# Patient Record
Sex: Female | Born: 1955 | Hispanic: No | Marital: Single | Smoking: Never smoker
Health system: Southern US, Community
[De-identification: ages and names within clinical notes are randomized; demographics above are authoritative.]

## PROBLEM LIST (undated history)

## (undated) DIAGNOSIS — B029 Zoster without complications: Secondary | ICD-10-CM

## (undated) DIAGNOSIS — R569 Unspecified convulsions: Secondary | ICD-10-CM

## (undated) DIAGNOSIS — N189 Chronic kidney disease, unspecified: Secondary | ICD-10-CM

## (undated) DIAGNOSIS — J45909 Unspecified asthma, uncomplicated: Secondary | ICD-10-CM

## (undated) DIAGNOSIS — D696 Thrombocytopenia, unspecified: Secondary | ICD-10-CM

## (undated) DIAGNOSIS — I058 Other rheumatic mitral valve diseases: Secondary | ICD-10-CM

## (undated) DIAGNOSIS — Z86718 Personal history of other venous thrombosis and embolism: Secondary | ICD-10-CM

## (undated) DIAGNOSIS — J441 Chronic obstructive pulmonary disease with (acute) exacerbation: Secondary | ICD-10-CM

## (undated) DIAGNOSIS — I959 Hypotension, unspecified: Secondary | ICD-10-CM

## (undated) DIAGNOSIS — A419 Sepsis, unspecified organism: Secondary | ICD-10-CM

## (undated) DIAGNOSIS — F32A Depression, unspecified: Secondary | ICD-10-CM

## (undated) DIAGNOSIS — I82409 Acute embolism and thrombosis of unspecified deep veins of unspecified lower extremity: Secondary | ICD-10-CM

## (undated) DIAGNOSIS — F329 Major depressive disorder, single episode, unspecified: Secondary | ICD-10-CM

## (undated) DIAGNOSIS — Z8659 Personal history of other mental and behavioral disorders: Secondary | ICD-10-CM

## (undated) DIAGNOSIS — M1990 Unspecified osteoarthritis, unspecified site: Secondary | ICD-10-CM

## (undated) DIAGNOSIS — F209 Schizophrenia, unspecified: Secondary | ICD-10-CM

## (undated) DIAGNOSIS — I10 Essential (primary) hypertension: Secondary | ICD-10-CM

## (undated) DIAGNOSIS — I358 Other nonrheumatic aortic valve disorders: Secondary | ICD-10-CM

## (undated) DIAGNOSIS — E039 Hypothyroidism, unspecified: Secondary | ICD-10-CM

## (undated) DIAGNOSIS — K219 Gastro-esophageal reflux disease without esophagitis: Secondary | ICD-10-CM

## (undated) HISTORY — DX: Zoster without complications: B02.9

## (undated) HISTORY — PX: SPLENECTOMY: SUR1306

## (undated) HISTORY — PX: REPLACEMENT TOTAL KNEE: SUR1224

## (undated) HISTORY — PX: ABDOMINAL HYSTERECTOMY: SHX81

## (undated) HISTORY — DX: Personal history of other venous thrombosis and embolism: Z86.718

## (undated) HISTORY — DX: Essential (primary) hypertension: I10

## (undated) HISTORY — DX: Personal history of other mental and behavioral disorders: Z86.59

## (undated) HISTORY — PX: THYROIDECTOMY, PARTIAL: SHX18

## (undated) HISTORY — DX: Chronic obstructive pulmonary disease with (acute) exacerbation: J44.1

## (undated) HISTORY — DX: Thrombocytopenia, unspecified: D69.6

---
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
## 2000-02-07 ENCOUNTER — Inpatient Hospital Stay (HOSPITAL_COMMUNITY): Admission: EM | Admit: 2000-02-07 | Discharge: 2000-02-16 | Payer: Self-pay | Admitting: *Deleted

## 2004-07-26 ENCOUNTER — Emergency Department
Admission: EM | Admit: 2004-07-26 | Disposition: A | Discharge status: Home / Self Care | Payer: Self-pay | Source: Ambulatory Visit

## 2012-02-24 DIAGNOSIS — H61329 Acquired stenosis of external ear canal secondary to inflammation and infection, unspecified ear: Secondary | ICD-10-CM | POA: Insufficient documentation

## 2012-02-24 DIAGNOSIS — H9190 Unspecified hearing loss, unspecified ear: Secondary | ICD-10-CM

## 2012-02-24 HISTORY — DX: Acquired stenosis of external ear canal secondary to inflammation and infection, unspecified ear: H61.329

## 2012-02-24 HISTORY — DX: Unspecified hearing loss, unspecified ear: H91.90

## 2012-03-17 HISTORY — DX: Morbid (severe) obesity due to excess calories: E66.01

## 2012-06-19 DIAGNOSIS — H61309 Acquired stenosis of external ear canal, unspecified, unspecified ear: Secondary | ICD-10-CM | POA: Insufficient documentation

## 2012-06-19 HISTORY — DX: Acquired stenosis of external ear canal, unspecified, unspecified ear: H61.309

## 2013-02-28 DIAGNOSIS — N951 Menopausal and female climacteric states: Secondary | ICD-10-CM | POA: Insufficient documentation

## 2013-02-28 DIAGNOSIS — L299 Pruritus, unspecified: Secondary | ICD-10-CM | POA: Insufficient documentation

## 2013-07-25 DIAGNOSIS — R599 Enlarged lymph nodes, unspecified: Secondary | ICD-10-CM | POA: Insufficient documentation

## 2013-12-03 DIAGNOSIS — R159 Full incontinence of feces: Secondary | ICD-10-CM | POA: Insufficient documentation

## 2013-12-03 DIAGNOSIS — R001 Bradycardia, unspecified: Secondary | ICD-10-CM | POA: Insufficient documentation

## 2013-12-03 DIAGNOSIS — I509 Heart failure, unspecified: Secondary | ICD-10-CM | POA: Insufficient documentation

## 2014-03-18 DIAGNOSIS — R10811 Right upper quadrant abdominal tenderness: Secondary | ICD-10-CM | POA: Insufficient documentation

## 2014-05-15 DIAGNOSIS — M79671 Pain in right foot: Secondary | ICD-10-CM | POA: Insufficient documentation

## 2014-06-03 DIAGNOSIS — R079 Chest pain, unspecified: Secondary | ICD-10-CM | POA: Insufficient documentation

## 2014-06-03 DIAGNOSIS — D869 Sarcoidosis, unspecified: Secondary | ICD-10-CM | POA: Insufficient documentation

## 2014-06-03 DIAGNOSIS — J45909 Unspecified asthma, uncomplicated: Secondary | ICD-10-CM

## 2014-06-03 DIAGNOSIS — E785 Hyperlipidemia, unspecified: Secondary | ICD-10-CM | POA: Insufficient documentation

## 2014-06-03 DIAGNOSIS — F419 Anxiety disorder, unspecified: Secondary | ICD-10-CM | POA: Insufficient documentation

## 2014-06-03 HISTORY — DX: Chest pain, unspecified: R07.9

## 2014-06-03 HISTORY — DX: Anxiety disorder, unspecified: F41.9

## 2014-06-03 HISTORY — DX: Hyperlipidemia, unspecified: E78.5

## 2014-07-03 DIAGNOSIS — R251 Tremor, unspecified: Secondary | ICD-10-CM | POA: Insufficient documentation

## 2014-07-24 DIAGNOSIS — S2092XD Blister (nonthermal) of unspecified parts of thorax, subsequent encounter: Secondary | ICD-10-CM | POA: Insufficient documentation

## 2014-09-05 DIAGNOSIS — R3981 Functional urinary incontinence: Secondary | ICD-10-CM | POA: Insufficient documentation

## 2015-05-29 DIAGNOSIS — K922 Gastrointestinal hemorrhage, unspecified: Secondary | ICD-10-CM | POA: Insufficient documentation

## 2015-07-02 DIAGNOSIS — F0280 Dementia in other diseases classified elsewhere without behavioral disturbance: Secondary | ICD-10-CM | POA: Insufficient documentation

## 2016-01-09 DIAGNOSIS — F209 Schizophrenia, unspecified: Secondary | ICD-10-CM

## 2016-01-09 DIAGNOSIS — M1990 Unspecified osteoarthritis, unspecified site: Secondary | ICD-10-CM

## 2016-01-09 DIAGNOSIS — E669 Obesity, unspecified: Secondary | ICD-10-CM

## 2016-01-09 DIAGNOSIS — F418 Other specified anxiety disorders: Secondary | ICD-10-CM

## 2016-01-09 DIAGNOSIS — R4182 Altered mental status, unspecified: Secondary | ICD-10-CM | POA: Insufficient documentation

## 2016-01-09 DIAGNOSIS — E785 Hyperlipidemia, unspecified: Secondary | ICD-10-CM

## 2016-01-09 DIAGNOSIS — I10 Essential (primary) hypertension: Secondary | ICD-10-CM | POA: Diagnosis not present

## 2016-01-09 DIAGNOSIS — N183 Chronic kidney disease, stage 3 (moderate): Secondary | ICD-10-CM | POA: Diagnosis not present

## 2016-01-09 DIAGNOSIS — J45909 Unspecified asthma, uncomplicated: Secondary | ICD-10-CM

## 2016-01-09 DIAGNOSIS — J189 Pneumonia, unspecified organism: Secondary | ICD-10-CM

## 2016-01-10 DIAGNOSIS — I10 Essential (primary) hypertension: Secondary | ICD-10-CM | POA: Diagnosis not present

## 2016-01-10 DIAGNOSIS — E669 Obesity, unspecified: Secondary | ICD-10-CM | POA: Diagnosis not present

## 2016-01-10 DIAGNOSIS — J189 Pneumonia, unspecified organism: Secondary | ICD-10-CM | POA: Diagnosis not present

## 2016-01-10 DIAGNOSIS — N183 Chronic kidney disease, stage 3 (moderate): Secondary | ICD-10-CM | POA: Diagnosis not present

## 2016-01-11 DIAGNOSIS — E669 Obesity, unspecified: Secondary | ICD-10-CM | POA: Diagnosis not present

## 2016-01-11 DIAGNOSIS — I10 Essential (primary) hypertension: Secondary | ICD-10-CM | POA: Diagnosis not present

## 2016-01-11 DIAGNOSIS — J189 Pneumonia, unspecified organism: Secondary | ICD-10-CM | POA: Diagnosis not present

## 2016-01-11 DIAGNOSIS — N183 Chronic kidney disease, stage 3 (moderate): Secondary | ICD-10-CM | POA: Diagnosis not present

## 2016-04-09 DIAGNOSIS — F2081 Schizophreniform disorder: Secondary | ICD-10-CM | POA: Insufficient documentation

## 2016-04-09 DIAGNOSIS — J4521 Mild intermittent asthma with (acute) exacerbation: Secondary | ICD-10-CM | POA: Insufficient documentation

## 2016-05-25 DIAGNOSIS — R296 Repeated falls: Secondary | ICD-10-CM | POA: Insufficient documentation

## 2016-10-18 ENCOUNTER — Encounter (HOSPITAL_COMMUNITY): Payer: Self-pay | Admitting: Emergency Medicine

## 2016-10-18 ENCOUNTER — Emergency Department (HOSPITAL_COMMUNITY)
Admission: EM | Admit: 2016-10-18 | Discharge: 2016-10-19 | Disposition: A | Discharge status: Home / Self Care | Payer: Medicaid Other | Attending: Emergency Medicine | Admitting: Emergency Medicine

## 2016-10-18 DIAGNOSIS — T148XXD Other injury of unspecified body region, subsequent encounter: Secondary | ICD-10-CM | POA: Diagnosis not present

## 2016-10-18 DIAGNOSIS — Y849 Medical procedure, unspecified as the cause of abnormal reaction of the patient, or of later complication, without mention of misadventure at the time of the procedure: Secondary | ICD-10-CM | POA: Insufficient documentation

## 2016-10-18 DIAGNOSIS — I82492 Acute embolism and thrombosis of other specified deep vein of left lower extremity: Secondary | ICD-10-CM

## 2016-10-18 DIAGNOSIS — T148XXA Other injury of unspecified body region, initial encounter: Secondary | ICD-10-CM

## 2016-10-18 DIAGNOSIS — Z79899 Other long term (current) drug therapy: Secondary | ICD-10-CM | POA: Insufficient documentation

## 2016-10-18 DIAGNOSIS — R2242 Localized swelling, mass and lump, left lower limb: Secondary | ICD-10-CM | POA: Diagnosis present

## 2016-10-18 DIAGNOSIS — R600 Localized edema: Secondary | ICD-10-CM | POA: Diagnosis not present

## 2016-10-18 LAB — COMPREHENSIVE METABOLIC PANEL
ALBUMIN: 3 g/dL — AB (ref 3.5–5.0)
ALT: 11 U/L — ABNORMAL LOW (ref 14–54)
ANION GAP: 6 (ref 5–15)
AST: 21 U/L (ref 15–41)
Alkaline Phosphatase: 73 U/L (ref 38–126)
BUN: 27 mg/dL — ABNORMAL HIGH (ref 6–20)
CHLORIDE: 108 mmol/L (ref 101–111)
CO2: 24 mmol/L (ref 22–32)
Calcium: 8.8 mg/dL — ABNORMAL LOW (ref 8.9–10.3)
Creatinine, Ser: 2.07 mg/dL — ABNORMAL HIGH (ref 0.44–1.00)
GFR calc Af Amer: 29 mL/min — ABNORMAL LOW (ref >60)
GFR calc non Af Amer: 25 mL/min — ABNORMAL LOW (ref >60)
GLUCOSE: 112 mg/dL — AB (ref 65–99)
POTASSIUM: 4.5 mmol/L (ref 3.5–5.1)
SODIUM: 138 mmol/L (ref 135–145)
Total Bilirubin: 0.5 mg/dL (ref 0.3–1.2)
Total Protein: 6.3 g/dL — ABNORMAL LOW (ref 6.5–8.1)

## 2016-10-18 LAB — CBC WITH DIFFERENTIAL/PLATELET
Basophils Absolute: 0 10*3/uL (ref 0.0–0.1)
Basophils Relative: 0 %
Eosinophils Absolute: 0.3 10*3/uL (ref 0.0–0.7)
Eosinophils Relative: 5 %
HEMATOCRIT: 37.3 % (ref 36.0–46.0)
HEMOGLOBIN: 11.8 g/dL — AB (ref 12.0–15.0)
LYMPHS ABS: 1.6 10*3/uL (ref 0.7–4.0)
LYMPHS PCT: 31 %
MCH: 28 pg (ref 26.0–34.0)
MCHC: 31.6 g/dL (ref 30.0–36.0)
MCV: 88.6 fL (ref 78.0–100.0)
MONO ABS: 0.5 10*3/uL (ref 0.1–1.0)
MONOS PCT: 10 %
NEUTROS ABS: 2.8 10*3/uL (ref 1.7–7.7)
Neutrophils Relative %: 54 %
Platelets: 102 10*3/uL — ABNORMAL LOW (ref 150–400)
RBC: 4.21 MIL/uL (ref 3.87–5.11)
RDW: 15.1 % (ref 11.5–15.5)
WBC: 5.1 10*3/uL (ref 4.0–10.5)

## 2016-10-18 LAB — I-STAT CG4 LACTIC ACID, ED: LACTIC ACID, VENOUS: 1.14 mmol/L (ref 0.5–1.9)

## 2016-10-18 NOTE — ED Triage Notes (Signed)
Pt to ER for evaluation of left lower leg swelling, states began one day last week. Left leg is significantly larger than right, is weeping. A/o x4.

## 2016-10-18 NOTE — ED Provider Notes (Signed)
[facility] DEPT Provider Note   CSN: [id number] Arrival date & time: 10/18/16  1529   History   Chief Complaint Chief Complaint  Patient presents with  . Leg Swelling   HPI Kelsey Leonard is a 61 y.o. female.  The patient is a 61 year old female with a past medical history significant for recent DVT (not currently on anticoagulation), seizures, schizophrenia, asthma, hypertension, hyperlipidemia, who presents to the ED in the company of her sister with pain, swelling, and weeping of her left lower extremity.  Her symptoms began 2 days ago chronic shortness of breath, however this has not worsened over the worsening swelling, pain, and clear, thin discharge from the wound to her left anterior shin.  She has not tried anything for the pain or swelling at this time.  The patient takes care of her own medications, which she gets through the mail from her pharmacy.  Her sister, who provides most of this history, is unsure whether or not she ever completed a full course of anticoagulation for her prior DVT.  The patient denies chest pain.  She reports chronic shortness of breath, however she denies any worsening of this over the past several days.   The history is provided by the patient, a relative and medical records. No language interpreter was used.   History reviewed. No pertinent past medical history.  There are no active problems to display for this patient.  History reviewed. No pertinent surgical history.  OB History    No data available     Home Medications    Prior to Admission medications   Medication Sig Start Date End Date Taking? Authorizing Provider  VESICARE 5 MG tablet Take 5 mg by mouth daily. 10/10/16  Yes [provider]  VITAMIN E PO Take 1 capsule by mouth daily.   Yes [provider]   Family History History reviewed. No pertinent family history.  Social History Social History  Substance Use Topics  . Smoking status: Never Smoker  .  Smokeless tobacco: Never Used  . Alcohol use Not on file   Allergies   Aspirin; Penicillins; and Sulfa antibiotics  Review of Systems Review of Systems  Constitutional: Negative for chills and fever.  HENT: Negative for ear pain and sore throat.   Eyes: Negative for pain and visual disturbance.  Respiratory: Positive for shortness of breath (chronic; no acute changes). Negative for cough.   Cardiovascular: Positive for leg swelling. Negative for chest pain and palpitations.  Gastrointestinal: Negative for abdominal pain and vomiting.  Genitourinary: Negative for dysuria and hematuria.       Urinary incontinence; no changes from baseline  Musculoskeletal: Negative for arthralgias and back pain.  Skin: Positive for wound. Negative for color change and rash.  Allergic/Immunologic: Negative for immunocompromised state.  Neurological: Negative for seizures and syncope.  All other systems reviewed and are negative.  Physical Exam Updated Vital Signs BP (!) 164/85 (BP Location: Left Arm)   Pulse (!) 57   Temp 97.9 F (36.6 C) (Oral)   Resp 16   SpO2 100%   Physical Exam  Constitutional: She is oriented to person, place, and time. She appears well-developed and well-nourished. No distress.  Obese woman in no acute distress  HENT:  Head: Normocephalic and atraumatic.  Eyes: Conjunctivae are normal.  Neck: Normal range of motion. Neck supple. No JVD present.  Cardiovascular: Regular rhythm, normal heart sounds and intact distal pulses.   No murmur heard. Pulmonary/Chest: Effort normal and breath sounds normal. No  respiratory distress.  Abdominal: Soft. There is no tenderness. There is no guarding.  Musculoskeletal: She exhibits edema and tenderness.  LLE with marked swelling and 2+ pitting edema; ~0.5cm wound to left anterior shin draining clear, serous fluid; normal sensation and gross motor function  Neurological: She is alert and oriented to person, place, and time.  Skin: Skin  is warm and dry.  Psychiatric: She has a normal mood and affect. Her behavior is normal.  Nursing note and vitals reviewed.   ED Treatments / Results  Labs (all labs ordered are listed, but only abnormal results are displayed) Labs Reviewed  COMPREHENSIVE METABOLIC PANEL - Abnormal; Notable for the following:       Result Value   Glucose, Bld 112 (*)    BUN 27 (*)    Creatinine, Ser 2.07 (*)    Calcium 8.8 (*)    Total Protein 6.3 (*)    Albumin 3.0 (*)    ALT 11 (*)    GFR calc non Af Amer 25 (*)    GFR calc Af Amer 29 (*)    All other components within normal limits  CBC WITH DIFFERENTIAL/PLATELET - Abnormal; Notable for the following:    Hemoglobin 11.8 (*)    Platelets 102 (*)    All other components within normal limits  I-STAT CG4 LACTIC ACID, ED  I-STAT CG4 LACTIC ACID, ED   EKG  EKG Interpretation None      Radiology No results found.  Procedures Procedures (including critical care time)  Medications Ordered in ED Medications - No data to display   Initial Impression / Assessment and Plan / ED Course  I have reviewed the triage vital signs and the nursing notes.  Pertinent labs & imaging results that were available during my care of the patient were reviewed by me and considered in my medical decision making (see chart for details).    Initial differential diagnosis included DVT, cellulitis, lymphedema, abscess, and trauma.  Pertinent labs included a CBC with mild normocytic anemia and thrombocytopenia; no leukocytosis.  No vascular studies available at this time.  The patient was given Lovenox for a presumed DVT and dermabond was applied to the patient's wound by the attending physician.  Upon reassessment, she had no new symptoms or complaints and was able to stand without assistance.  While the patient remained in the ED, her mother was contacted and reported that the patient had run out of Eliquis two weeks ago.  Based on the above findings, I suspect  the patient's pain and swelling is likely secondary to a persistent DVT.  She will be treated empirically for a DVT at this time, however she will require imaging to determine if her known DVT has grown.  I have a low suspicion for PE at this time given the absence of chest pain, hypoxia, and dyspnea.  No skin changes consistent with cellulitis.  No evidence of fluctuance or mass, decreasing my suspicion for an abscess.  History not consistent with traumatic injury.  I discussed the above results with the patient who verbalized understanding.  Return precautions and follow-up plans discussed including return to [facility] for a Doppler ultrasound tomorrow.  She was given a prescription for Eliquis with instructions for use outlined verbally and in writing.  She was informed that failure to comply with medical therapy may result in significant morbidity and/or mortality, which the patient and her sister understood.  The patient was discharged in stable condition.  Final Clinical Impressions(s) /  ED Diagnoses   Final diagnoses:  Acute deep vein thrombosis (DVT) of other specified vein of left lower extremity ([place])  Leg edema  Wound discharge   New Prescriptions New Prescriptions   No medications on file     Charisse March, MD 10/19/16 1188    Gareth Morgan, MD 10/25/16 2238

## 2016-10-18 NOTE — ED Notes (Signed)
MD at bedside. 

## 2016-10-19 ENCOUNTER — Ambulatory Visit (HOSPITAL_COMMUNITY)
Admission: RE | Admit: 2016-10-19 | Discharge: 2016-10-19 | Disposition: A | Discharge status: Home / Self Care | Payer: Medicaid Other | Source: Ambulatory Visit | Attending: Emergency Medicine | Admitting: Emergency Medicine

## 2016-10-19 DIAGNOSIS — M7989 Other specified soft tissue disorders: Secondary | ICD-10-CM | POA: Insufficient documentation

## 2016-10-19 DIAGNOSIS — M79609 Pain in unspecified limb: Secondary | ICD-10-CM

## 2016-10-19 MED ORDER — ENOXAPARIN SODIUM 30 MG/0.3ML ~~LOC~~ SOLN: 30.0000 mg | Freq: Once | SUBCUTANEOUS | Status: DC

## 2016-10-19 MED ORDER — APIXABAN 5 MG PO TABS: 5.0000 mg | ORAL_TABLET | Freq: Two times a day (BID) | ORAL | 0 refills | Status: DC

## 2016-10-19 MED ORDER — ENOXAPARIN SODIUM 120 MG/0.8ML ~~LOC~~ SOLN
110.0000 mg | Freq: Once | SUBCUTANEOUS | Status: AC
  Administered 2016-10-19: 110 mg via SUBCUTANEOUS
  Filled 2016-10-19: qty 0.8

## 2016-10-19 MED ORDER — APIXABAN 5 MG PO TABS: 10.0000 mg | ORAL_TABLET | Freq: Two times a day (BID) | ORAL | 0 refills | Status: DC

## 2016-10-19 NOTE — Progress Notes (Signed)
VASCULAR LAB PRELIMINARY  PRELIMINARY  PRELIMINARY  PRELIMINARY  Left lower extremity venous duplex completed.    Preliminary report:  Left - Positive for acute DVT in the popliteal vein. No obvious evidence of superficial thrombosis or baker's cyst.  Taggart Prasad, RVS 10/19/2016, 9:28 AM

## 2016-10-19 NOTE — ED Notes (Signed)
Pt stable, ambulatory, states understanding of discharge instructions 

## 2016-10-19 NOTE — Discharge Instructions (Addendum)
Take two tablets (10mg ) twice daily for 1 week.  Then take one tablet (5mg ) twice daily.  Please return to the hospital tomorrow for a formal ultrasound of your left lower extremity.  Follow-up with your primary care doctor within 1 week.

## 2017-01-17 DIAGNOSIS — R0600 Dyspnea, unspecified: Secondary | ICD-10-CM | POA: Insufficient documentation

## 2017-01-17 DIAGNOSIS — R42 Dizziness and giddiness: Secondary | ICD-10-CM | POA: Insufficient documentation

## 2018-02-23 DIAGNOSIS — L040 Acute lymphadenitis of face, head and neck: Secondary | ICD-10-CM | POA: Insufficient documentation

## 2018-03-30 DIAGNOSIS — R319 Hematuria, unspecified: Secondary | ICD-10-CM | POA: Insufficient documentation

## 2018-08-07 DIAGNOSIS — R609 Edema, unspecified: Secondary | ICD-10-CM | POA: Insufficient documentation

## 2018-08-07 DIAGNOSIS — R7981 Abnormal blood-gas level: Secondary | ICD-10-CM | POA: Insufficient documentation

## 2018-08-07 DIAGNOSIS — I361 Nonrheumatic tricuspid (valve) insufficiency: Secondary | ICD-10-CM | POA: Diagnosis not present

## 2018-08-08 DIAGNOSIS — I361 Nonrheumatic tricuspid (valve) insufficiency: Secondary | ICD-10-CM | POA: Diagnosis not present

## 2018-08-08 DIAGNOSIS — I161 Hypertensive emergency: Secondary | ICD-10-CM | POA: Diagnosis not present

## 2018-08-08 DIAGNOSIS — J9601 Acute respiratory failure with hypoxia: Secondary | ICD-10-CM | POA: Diagnosis not present

## 2018-08-08 DIAGNOSIS — N289 Disorder of kidney and ureter, unspecified: Secondary | ICD-10-CM | POA: Diagnosis not present

## 2018-08-08 DIAGNOSIS — N184 Chronic kidney disease, stage 4 (severe): Secondary | ICD-10-CM

## 2018-08-09 DIAGNOSIS — I161 Hypertensive emergency: Secondary | ICD-10-CM | POA: Diagnosis not present

## 2018-08-09 DIAGNOSIS — J9601 Acute respiratory failure with hypoxia: Secondary | ICD-10-CM | POA: Diagnosis not present

## 2018-08-09 DIAGNOSIS — N289 Disorder of kidney and ureter, unspecified: Secondary | ICD-10-CM | POA: Diagnosis not present

## 2018-08-10 DIAGNOSIS — I161 Hypertensive emergency: Secondary | ICD-10-CM | POA: Diagnosis not present

## 2018-08-10 DIAGNOSIS — J9601 Acute respiratory failure with hypoxia: Secondary | ICD-10-CM | POA: Diagnosis not present

## 2018-08-10 DIAGNOSIS — N289 Disorder of kidney and ureter, unspecified: Secondary | ICD-10-CM | POA: Diagnosis not present

## 2018-08-11 DIAGNOSIS — I161 Hypertensive emergency: Secondary | ICD-10-CM | POA: Diagnosis not present

## 2018-08-11 DIAGNOSIS — N289 Disorder of kidney and ureter, unspecified: Secondary | ICD-10-CM | POA: Diagnosis not present

## 2018-08-11 DIAGNOSIS — J9601 Acute respiratory failure with hypoxia: Secondary | ICD-10-CM | POA: Diagnosis not present

## 2018-08-12 ENCOUNTER — Inpatient Hospital Stay (HOSPITAL_COMMUNITY): Payer: Medicaid Other

## 2018-08-12 ENCOUNTER — Inpatient Hospital Stay (HOSPITAL_COMMUNITY)
Admission: AD | Admit: 2018-08-12 | Discharge: 2018-08-24 | DRG: 871 | Disposition: A | Discharge status: Home / Self Care | Payer: Medicaid Other | Source: Other Acute Inpatient Hospital | Attending: Internal Medicine | Admitting: Internal Medicine

## 2018-08-12 ENCOUNTER — Encounter (HOSPITAL_COMMUNITY): Payer: Self-pay | Admitting: Pulmonary Disease

## 2018-08-12 DIAGNOSIS — Z6841 Body Mass Index (BMI) 40.0 and over, adult: Secondary | ICD-10-CM | POA: Diagnosis not present

## 2018-08-12 DIAGNOSIS — E039 Hypothyroidism, unspecified: Secondary | ICD-10-CM | POA: Diagnosis present

## 2018-08-12 DIAGNOSIS — Z86718 Personal history of other venous thrombosis and embolism: Secondary | ICD-10-CM | POA: Diagnosis not present

## 2018-08-12 DIAGNOSIS — D6959 Other secondary thrombocytopenia: Secondary | ICD-10-CM | POA: Diagnosis present

## 2018-08-12 DIAGNOSIS — E873 Alkalosis: Secondary | ICD-10-CM | POA: Diagnosis not present

## 2018-08-12 DIAGNOSIS — Z4659 Encounter for fitting and adjustment of other gastrointestinal appliance and device: Secondary | ICD-10-CM

## 2018-08-12 DIAGNOSIS — Z95828 Presence of other vascular implants and grafts: Secondary | ICD-10-CM | POA: Diagnosis not present

## 2018-08-12 DIAGNOSIS — I440 Atrioventricular block, first degree: Secondary | ICD-10-CM | POA: Diagnosis present

## 2018-08-12 DIAGNOSIS — A4102 Sepsis due to Methicillin resistant Staphylococcus aureus: Principal | ICD-10-CM | POA: Diagnosis present

## 2018-08-12 DIAGNOSIS — D869 Sarcoidosis, unspecified: Secondary | ICD-10-CM | POA: Diagnosis present

## 2018-08-12 DIAGNOSIS — F209 Schizophrenia, unspecified: Secondary | ICD-10-CM | POA: Diagnosis present

## 2018-08-12 DIAGNOSIS — I509 Heart failure, unspecified: Secondary | ICD-10-CM | POA: Diagnosis present

## 2018-08-12 DIAGNOSIS — Z880 Allergy status to penicillin: Secondary | ICD-10-CM

## 2018-08-12 DIAGNOSIS — G253 Myoclonus: Secondary | ICD-10-CM | POA: Diagnosis present

## 2018-08-12 DIAGNOSIS — I130 Hypertensive heart and chronic kidney disease with heart failure and stage 1 through stage 4 chronic kidney disease, or unspecified chronic kidney disease: Secondary | ICD-10-CM | POA: Diagnosis present

## 2018-08-12 DIAGNOSIS — G9341 Metabolic encephalopathy: Secondary | ICD-10-CM

## 2018-08-12 DIAGNOSIS — N184 Chronic kidney disease, stage 4 (severe): Secondary | ICD-10-CM | POA: Diagnosis present

## 2018-08-12 DIAGNOSIS — Z833 Family history of diabetes mellitus: Secondary | ICD-10-CM | POA: Diagnosis not present

## 2018-08-12 DIAGNOSIS — J9690 Respiratory failure, unspecified, unspecified whether with hypoxia or hypercapnia: Secondary | ICD-10-CM

## 2018-08-12 DIAGNOSIS — J9621 Acute and chronic respiratory failure with hypoxia: Secondary | ICD-10-CM

## 2018-08-12 DIAGNOSIS — R4182 Altered mental status, unspecified: Secondary | ICD-10-CM

## 2018-08-12 DIAGNOSIS — I161 Hypertensive emergency: Secondary | ICD-10-CM | POA: Diagnosis not present

## 2018-08-12 DIAGNOSIS — M1990 Unspecified osteoarthritis, unspecified site: Secondary | ICD-10-CM | POA: Diagnosis present

## 2018-08-12 DIAGNOSIS — N189 Chronic kidney disease, unspecified: Secondary | ICD-10-CM | POA: Diagnosis not present

## 2018-08-12 DIAGNOSIS — J9600 Acute respiratory failure, unspecified whether with hypoxia or hypercapnia: Secondary | ICD-10-CM | POA: Diagnosis not present

## 2018-08-12 DIAGNOSIS — J9622 Acute and chronic respiratory failure with hypercapnia: Secondary | ICD-10-CM | POA: Diagnosis present

## 2018-08-12 DIAGNOSIS — Z91013 Allergy to seafood: Secondary | ICD-10-CM

## 2018-08-12 DIAGNOSIS — J9601 Acute respiratory failure with hypoxia: Secondary | ICD-10-CM | POA: Diagnosis not present

## 2018-08-12 DIAGNOSIS — R7881 Bacteremia: Secondary | ICD-10-CM

## 2018-08-12 DIAGNOSIS — I340 Nonrheumatic mitral (valve) insufficiency: Secondary | ICD-10-CM | POA: Diagnosis not present

## 2018-08-12 DIAGNOSIS — E669 Obesity, unspecified: Secondary | ICD-10-CM | POA: Diagnosis present

## 2018-08-12 DIAGNOSIS — F329 Major depressive disorder, single episode, unspecified: Secondary | ICD-10-CM | POA: Diagnosis present

## 2018-08-12 DIAGNOSIS — J441 Chronic obstructive pulmonary disease with (acute) exacerbation: Secondary | ICD-10-CM | POA: Diagnosis present

## 2018-08-12 DIAGNOSIS — Z886 Allergy status to analgesic agent status: Secondary | ICD-10-CM

## 2018-08-12 DIAGNOSIS — Z8249 Family history of ischemic heart disease and other diseases of the circulatory system: Secondary | ICD-10-CM

## 2018-08-12 DIAGNOSIS — Z881 Allergy status to other antibiotic agents status: Secondary | ICD-10-CM | POA: Diagnosis not present

## 2018-08-12 DIAGNOSIS — R6521 Severe sepsis with septic shock: Secondary | ICD-10-CM | POA: Diagnosis present

## 2018-08-12 DIAGNOSIS — N170 Acute kidney failure with tubular necrosis: Secondary | ICD-10-CM | POA: Diagnosis present

## 2018-08-12 DIAGNOSIS — J45909 Unspecified asthma, uncomplicated: Secondary | ICD-10-CM

## 2018-08-12 DIAGNOSIS — Z882 Allergy status to sulfonamides status: Secondary | ICD-10-CM | POA: Diagnosis not present

## 2018-08-12 DIAGNOSIS — B9562 Methicillin resistant Staphylococcus aureus infection as the cause of diseases classified elsewhere: Secondary | ICD-10-CM | POA: Diagnosis not present

## 2018-08-12 DIAGNOSIS — M549 Dorsalgia, unspecified: Secondary | ICD-10-CM | POA: Diagnosis not present

## 2018-08-12 DIAGNOSIS — J45901 Unspecified asthma with (acute) exacerbation: Secondary | ICD-10-CM | POA: Diagnosis not present

## 2018-08-12 DIAGNOSIS — N179 Acute kidney failure, unspecified: Secondary | ICD-10-CM

## 2018-08-12 DIAGNOSIS — Z9911 Dependence on respirator [ventilator] status: Secondary | ICD-10-CM | POA: Diagnosis not present

## 2018-08-12 DIAGNOSIS — R251 Tremor, unspecified: Secondary | ICD-10-CM | POA: Diagnosis not present

## 2018-08-12 DIAGNOSIS — R0600 Dyspnea, unspecified: Secondary | ICD-10-CM

## 2018-08-12 DIAGNOSIS — K219 Gastro-esophageal reflux disease without esophagitis: Secondary | ICD-10-CM | POA: Diagnosis present

## 2018-08-12 DIAGNOSIS — B9561 Methicillin susceptible Staphylococcus aureus infection as the cause of diseases classified elsewhere: Secondary | ICD-10-CM | POA: Diagnosis not present

## 2018-08-12 DIAGNOSIS — N289 Disorder of kidney and ureter, unspecified: Secondary | ICD-10-CM | POA: Diagnosis not present

## 2018-08-12 HISTORY — DX: Hypothyroidism, unspecified: E03.9

## 2018-08-12 HISTORY — DX: Schizophrenia, unspecified: F20.9

## 2018-08-12 HISTORY — DX: Unspecified convulsions: R56.9

## 2018-08-12 HISTORY — DX: Unspecified asthma, uncomplicated: J45.909

## 2018-08-12 HISTORY — DX: Gastro-esophageal reflux disease without esophagitis: K21.9

## 2018-08-12 HISTORY — DX: Essential (primary) hypertension: I10

## 2018-08-12 HISTORY — DX: Depression, unspecified: F32.A

## 2018-08-12 HISTORY — DX: Chronic kidney disease, unspecified: N18.9

## 2018-08-12 HISTORY — DX: Acute embolism and thrombosis of unspecified deep veins of unspecified lower extremity: I82.409

## 2018-08-12 HISTORY — DX: Unspecified osteoarthritis, unspecified site: M19.90

## 2018-08-12 HISTORY — DX: Respiratory failure, unspecified, unspecified whether with hypoxia or hypercapnia: J96.90

## 2018-08-12 LAB — GLUCOSE, CAPILLARY
Glucose-Capillary: 117 mg/dL — ABNORMAL HIGH (ref 70–99)
Glucose-Capillary: 119 mg/dL — ABNORMAL HIGH (ref 70–99)
Glucose-Capillary: 122 mg/dL — ABNORMAL HIGH (ref 70–99)

## 2018-08-12 LAB — POCT I-STAT 7, (LYTES, BLD GAS, ICA,H+H)
Acid-Base Excess: 17 mmol/L — ABNORMAL HIGH (ref 0.0–2.0)
Bicarbonate: 44.5 mmol/L — ABNORMAL HIGH (ref 20.0–28.0)
Calcium, Ion: 1.18 mmol/L (ref 1.15–1.40)
HCT: 48 % — ABNORMAL HIGH (ref 36.0–46.0)
Hemoglobin: 16.3 g/dL — ABNORMAL HIGH (ref 12.0–15.0)
O2 Saturation: 97 %
Patient temperature: 98.6
Potassium: 4.1 mmol/L (ref 3.5–5.1)
Sodium: 138 mmol/L (ref 135–145)
TCO2: 46 mmol/L — ABNORMAL HIGH (ref 22–32)
pCO2 arterial: 60.5 mmHg — ABNORMAL HIGH (ref 32.0–48.0)
pH, Arterial: 7.475 — ABNORMAL HIGH (ref 7.350–7.450)
pO2, Arterial: 92 mmHg (ref 83.0–108.0)

## 2018-08-12 LAB — MRSA PCR SCREENING: MRSA by PCR: POSITIVE — AB

## 2018-08-12 LAB — CORTISOL: Cortisol, Plasma: 9.3 ug/dL

## 2018-08-12 LAB — APTT: aPTT: 31 seconds (ref 24–36)

## 2018-08-12 LAB — HEPARIN LEVEL (UNFRACTIONATED): Heparin Unfractionated: >2.2 IU/mL — ABNORMAL HIGH (ref 0.30–0.70)

## 2018-08-12 LAB — MAGNESIUM: Magnesium: 2.1 mg/dL (ref 1.7–2.4)

## 2018-08-12 LAB — TSH: TSH: 1.773 u[IU]/mL (ref 0.350–4.500)

## 2018-08-12 LAB — PHOSPHORUS: Phosphorus: 1.9 mg/dL — ABNORMAL LOW (ref 2.5–4.6)

## 2018-08-12 MED ORDER — CHLORHEXIDINE GLUCONATE CLOTH 2 % EX PADS
6.0000 | MEDICATED_PAD | Freq: Every day | CUTANEOUS | Status: DC
  Administered 2018-08-12 – 2018-08-23 (×12): 6 via TOPICAL

## 2018-08-12 MED ORDER — VANCOMYCIN VARIABLE DOSE PER UNSTABLE RENAL FUNCTION (PHARMACIST DOSING): Status: DC

## 2018-08-12 MED ORDER — ORAL CARE MOUTH RINSE
15.0000 mL | OROMUCOSAL | Status: DC
  Administered 2018-08-12 – 2018-08-15 (×28): 15 mL via OROMUCOSAL

## 2018-08-12 MED ORDER — VITAL HIGH PROTEIN PO LIQD
1000.0000 mL | ORAL | Status: DC
  Administered 2018-08-12 – 2018-08-13 (×2): 1000 mL

## 2018-08-12 MED ORDER — FENTANYL CITRATE (PF) 100 MCG/2ML IJ SOLN
50.0000 ug | INTRAMUSCULAR | Status: DC | PRN
  Filled 2018-08-12: qty 2

## 2018-08-12 MED ORDER — LACTATED RINGERS IV SOLN
INTRAVENOUS | Status: DC
  Administered 2018-08-12 – 2018-08-15 (×3): via INTRAVENOUS

## 2018-08-12 MED ORDER — PROPOFOL 1000 MG/100ML IV EMUL: 0.0000 ug/kg/min | INTRAVENOUS | Status: DC

## 2018-08-12 MED ORDER — DOCUSATE SODIUM 50 MG/5ML PO LIQD: 100.0000 mg | Freq: Two times a day (BID) | ORAL | Status: DC | PRN

## 2018-08-12 MED ORDER — PANTOPRAZOLE SODIUM 40 MG PO PACK
40.0000 mg | PACK | ORAL | Status: DC
  Administered 2018-08-12 – 2018-08-13 (×2): 40 mg
  Filled 2018-08-12 (×3): qty 20

## 2018-08-12 MED ORDER — CHLORHEXIDINE GLUCONATE 0.12% ORAL RINSE (MEDLINE KIT)
15.0000 mL | Freq: Two times a day (BID) | OROMUCOSAL | Status: DC
  Administered 2018-08-12 – 2018-08-15 (×5): 15 mL via OROMUCOSAL

## 2018-08-12 MED ORDER — IPRATROPIUM-ALBUTEROL 0.5-2.5 (3) MG/3ML IN SOLN: 3.0000 mL | RESPIRATORY_TRACT | Status: DC | PRN

## 2018-08-12 MED ORDER — ACETAMINOPHEN 325 MG PO TABS
650.0000 mg | ORAL_TABLET | Freq: Four times a day (QID) | ORAL | Status: DC | PRN
  Administered 2018-08-15: 650 mg
  Filled 2018-08-12: qty 2

## 2018-08-12 MED ORDER — NOREPINEPHRINE 4 MG/250ML-% IV SOLN: 0.0000 ug/min | INTRAVENOUS | Status: DC

## 2018-08-12 MED ORDER — PRO-STAT SUGAR FREE PO LIQD
30.0000 mL | Freq: Two times a day (BID) | ORAL | Status: DC
  Administered 2018-08-12 – 2018-08-13 (×3): 30 mL
  Filled 2018-08-12 (×3): qty 30

## 2018-08-12 MED ORDER — MIDAZOLAM HCL 2 MG/2ML IJ SOLN: 2.0000 mg | INTRAMUSCULAR | Status: DC | PRN

## 2018-08-12 MED ORDER — BISACODYL 10 MG RE SUPP: 10.0000 mg | Freq: Every day | RECTAL | Status: DC | PRN

## 2018-08-12 MED ORDER — HEPARIN (PORCINE) 25000 UT/250ML-% IV SOLN
800.0000 [IU]/h | INTRAVENOUS | Status: DC
  Administered 2018-08-12: 1150 [IU]/h via INTRAVENOUS
  Administered 2018-08-13: 18:00:00 900 [IU]/h via INTRAVENOUS
  Filled 2018-08-12 (×3): qty 250

## 2018-08-12 NOTE — Progress Notes (Signed)
Attempted to collect sputum sample. Suctioned and patient does not have any sputum at this time. Will attempt again later.

## 2018-08-12 NOTE — Plan of Care (Signed)

## 2018-08-12 NOTE — Procedures (Signed)
Patient Name: Kelsey Leonard  MRN: [id number]  Epilepsy Attending: Lora Havens  Referring Physician/Provider: Dr Remi Haggard Aroor Date: 08/12/18 Duration: 23.09 mins  Patient history: 63yo F noted to have worsening mental status with myoclonic jerks. EEG ordered to evaluate seizures.   Level of alertness: lethargic/ comatose  AEDs during EEG study: None  Technical aspects: This EEG study was done with scalp electrodes positioned according to the 10-20 International system of electrode placement. Electrical activity was reviewed with a high frequency filter of 70Hz  and a low frequency filter of 1Hz . EEG data were recorded continuously and digitally stored.   BACKGROUND ACTIVITY:  Slowing: Continuous 3-5 hz generalized theta-delta slowing  ACTIVATION PROCEDURES:  Hyperventilation and photic stimulation were not performed.  IMPRESSION: This study is suggestive of moderate diffuse encephalopathy. No seizures or epileptiform discharges were seen throughout the recording.  Antavious Spanos Barbra Sarks

## 2018-08-12 NOTE — Progress Notes (Signed)
ANTICOAGULATION CONSULT NOTE - Initial Consult  Pharmacy Consult for heparin due to history of DVT Indication: H/O DVT on Apixaban PTA  Allergies  Allergen Reactions  . Aspirin Nausea And Vomiting  . Penicillins Hives    Has patient had a PCN reaction causing immediate rash, facial/tongue/throat swelling, SOB or lightheadedness with hypotension: Yes Has patient had a PCN reaction causing severe rash involving mucus membranes or skin necrosis: Yes Has patient had a PCN reaction that required hospitalization: Yes Has patient had a PCN reaction occurring within the last 10 years: No If all of the above answers are "NO", then may proceed with Cephalosporin use.   . Shellfish Allergy Other (See Comments)  . Sulfa Antibiotics Rash    Patient Measurements: Height: _0  (154.9 cm) Weight: 220 lb 10.9 oz (100.1 kg) IBW/kg (Calculated) : 47.8 Heparin Dosing Weight: 71.9 kg  Vital Signs: Temp: 99.9 F (37.7 C) (07/25 1954) Temp Source: Oral (07/25 1954) BP: 131/87 (07/25 1800) Pulse Rate: 59 (07/25 1700)  Labs: Recent Labs    08/12/18 1609 08/12/18 1815  HGB 16.3*  --   HCT 48.0*  --   APTT  --  31  HEPARINUNFRC  --  >2.20*    CrCl cannot be calculated (Patient's most recent lab result is older than the maximum 21 days allowed.).   Medical History: Past Medical History:  Diagnosis Date  . Asthma   . CKD (chronic kidney disease)   . Depression   . DVT (deep venous thrombosis) ([place])   . GERD (gastroesophageal reflux disease)   . Hypertension   . Hypothyroidism   . Osteoarthritis   . Schizophrenia ([place])   . Seizures (H[facility])     Medications:  Scheduled:  . chlorhexidine gluconate (MEDLINE KIT)  15 mL Mouth Rinse BID  . Chlorhexidine Gluconate Cloth  6 each Topical Daily  . feeding supplement (PRO-STAT SUGAR FREE 64)  30 mL Per Tube BID  . feeding supplement (VITAL HIGH PROTEIN)  1,000 mL Per Tube Q24H  . mouth rinse  15 mL Mouth Rinse 10 times per day  .  pantoprazole sodium  40 mg Per Tube Q24H  . vancomycin variable dose per unstable renal function (pharmacist dosing)   Does not apply See admin instructions     Assessment: Kelsey Leonard is a 63 y.o. female with PMH of HTN, asthma, LLE DVT 05/2017, CKD 4, schizophrenia, depression, and hypothyroidism who was transferred to [facility] from [facility] on 7/25. She initially presented to [place] on 7/20 for lip swelling. She was found to have hypoxia (SpO2 50%) with several weeks of dyspnea, leg swelling, HTN emergency, and CHF. On 07/25 she was found to have decreased AMS and myoclonic jerks. She was intubated and started on pressors CCM management at [facility]. Pharmacy consulted to dose heparin due to history of DVT. Pt was taking Apixaban 47m BID PTA. Last dose of apixaban [facility] 07/25 at 0900 [facility]/Hct are normal at 16.3/48.  Goal of Therapy:  Heparin level 0.3-0.7 units/ml aPTT 66-102 seconds Monitor platelets by anticoagulation protocol: Yes   Plan:  Baseline Aptt and HL checked. HL at 2.2 as expected due to aGirard Aptt is at 31. Start heparin infusion at 1150 units/hr at 2100 (12 hours after last apixiban dose) Check Aptt 6 hours after starting heparin (0330 on 07/26) Monitor daily Aptt and HL with AM labs starting 07/27 until both levels corollate. Continue to monitor H&H and platelets daily with CBC.  KSedonia Small  D Constance Whittle 08/12/2018,7:56 PM

## 2018-08-12 NOTE — H&P (Signed)
NAME:  Kelsey Leonard, MRN:  [id number], DOB:  1955-05-13, LOS: 0 ADMISSION DATE:  08/12/2018, CONSULTATION DATE:  08/12/2018 REFERRING MD:  Dr. Roger Shelter, [place]:  Altered mental status   Brief History   63 yo female presented to [facility] 7/20 with lip swelling.  Found to have hypoxia (SpO2 50%) with several weeks of dyspnea, leg swelling and weight gain with blood pressure 201/95.  COVID testing negative.  Treated for HTN emergency and CHF.  Found to have Staph aureus bacteremia and started on antibiotics.  She was noted to have worsening mental status with myoclonic jerks and hypoxia/hypercapnia on 7/25.  She was intubated, started on pressors and transferred to [facility].  History from chart and [facility] medical team.  Patient unable to provide history.  Past Medical History  HTN, Asthma, Lt leg DVT May 2019, CKD 4, Schizophrenia, Depression, Hypothyroidism  Significant Hospital Events   7/25 Transfer from [place]:  Neurology 7/25 AMS   Procedures:  ETT 7/25 >> [place] CVL 7/25 >>   Significant Diagnostic Tests:  Echo 08/08/18 >> mild LVH, EF 55 to 60%, septal flattening in systole, mod RV enlargement CT head 7/25 >> EEG 7/25 >>   Micro Data:  Blood 7/20 [name]) >> MRSA COVID 7/20 [name]) >> Negative Blood 7/25 >> Sputum 7/25 >>   Antimicrobials:  Vancomycin 7/20 >>   Interim history/subjective:    Objective   Blood pressure (!) 126/102, pulse 66, temperature 98.8 F (37.1 C), temperature source Axillary, resp. rate 18, height 5\' 1"  (1.549 m), weight 100.1 kg, SpO2 98 %.    Vent Mode: PRVC FiO2 (%):  [40 %] 40 % Set Rate:  [16 bmp-18 bmp] 16 bmp Vt Set:  [380 mL] 380 mL PEEP:  [5 cmH20] 5 cmH20 Plateau Pressure:  [30 cmH20] 30 cmH20  No intake or output data in the 24 hours ending 08/12/18 1641 Filed Weights   08/12/18 1528  Weight: 100.1 kg    Examination:  General - on vent Eyes - pupils reactive, upward  gaze ENT - ETT in place Cardiac - regular rate/rhythm, no murmur Chest - decreased BS, no wheeze Abdomen - soft, non tender, + bowel sounds Extremities - 1+ edema Skin - no rashes Neuro - opens eyes and withdraws to painful stimuli, not following commands Lymphatics - no lymphadenopathy  Labs from [place] reviewed - PLT 99, Creatinine 2.7, Hb 14.2  CXR (reviewed by me) - basilar ASD/ATX, CM  Resolved Hospital Problem list     Assessment & Plan:   Acute on chronic hypoxic, hypercapnic respiratory failure. Hx of asthma. - likely has underlying hypoventilation exacerbation by acute illness - full vent support - f/u CXR, ABG - prn BDs  Acute metabolic encephalopathy with myoclonic jerks. Hx of Schizophrenia and depression. - neurology consulted - f/u EEG, CT head - RASS goal 0 to -1 - check TSH, cortisol  Septic shock with MRSA bacteremia. - continue vancomycin - repeat blood cultures - pressors to keep MAP > 65  Hx of HTN. - now hypotensive in setting of sepsis - hold outpt BP meds  AKI from ATN with CKD 4. - f/u BMET  Thrombocytopenia in setting of sepsis. - f/u CBC  Hx of Lt leg DVT. - hold outpt eliquis - heparin gtt per pharmacy  Best practice:  Diet: tube feeds DVT prophylaxis: heparin gtt GI prophylaxis: protonix Mobility: bed rest Code Status: full code Disposition: ICU  Labs   CBC: Recent Labs  Lab 08/12/18 1609  HGB 16.3*  HCT 48.0*    Basic Metabolic Panel: Recent Labs  Lab 08/12/18 1609  NA 138  K 4.1   GFR: CrCl cannot be calculated (Patient's most recent lab result is older than the maximum 21 days allowed.). No results for input(s): PROCALCITON, WBC, LATICACIDVEN in the last 168 hours.  Liver Function Tests: No results for input(s): AST, ALT, ALKPHOS, BILITOT, PROT, ALBUMIN in the last 168 hours. No results for input(s): LIPASE, AMYLASE in the last 168 hours. No results for input(s): AMMONIA in the last 168 hours.  ABG     Component Value Date/Time   PHART 7.475 (H) 08/12/2018 1609   PCO2ART 60.5 (H) 08/12/2018 1609   PO2ART 92.0 08/12/2018 1609   HCO3 44.5 (H) 08/12/2018 1609   TCO2 46 (H) 08/12/2018 1609   O2SAT 97.0 08/12/2018 1609     Coagulation Profile: No results for input(s): INR, PROTIME in the last 168 hours.  Cardiac Enzymes: No results for input(s): CKTOTAL, CKMB, CKMBINDEX, TROPONINI in the last 168 hours.  HbA1C: No results found for: HGBA1C  CBG: Recent Labs  Lab 08/12/18 1516  GLUCAP 119*    Review of Systems:   Unable to obtain.  Past Medical History  She,  has a past medical history of Asthma, CKD (chronic kidney disease), Depression, DVT (deep venous thrombosis) ([place]), GERD (gastroesophageal reflux disease), Hypertension, Hypothyroidism, Osteoarthritis, Schizophrenia ([place]), and Seizures ([place]).   Surgical History    Past Surgical History:  Procedure Laterality Date  . ABDOMINAL HYSTERECTOMY    . THYROIDECTOMY, PARTIAL       Social History   reports that she has never smoked. She has never used smokeless tobacco.   Family History   Her family history includes Diabetes in her brother and sister; Hypertension in her brother, mother, and sister.   Allergies Allergies  Allergen Reactions  . Aspirin Nausea And Vomiting  . Penicillins Hives    Has patient had a PCN reaction causing immediate rash, facial/tongue/throat swelling, SOB or lightheadedness with hypotension: Yes Has patient had a PCN reaction causing severe rash involving mucus membranes or skin necrosis: Yes Has patient had a PCN reaction that required hospitalization: Yes Has patient had a PCN reaction occurring within the last 10 years: No If all of the above answers are "NO", then may proceed with Cephalosporin use.   . Shellfish Allergy Other (See Comments)  . Sulfa Antibiotics Rash     Home Medications  Zoloft, Benztropine, Albuterol, Eliquis, Meclizine, Fanapt   Critical care time: 54  minutes    D/w Dr. Lorraine Lax  Chesley Mires, MD [place] 08/12/2018, 4:47 PM

## 2018-08-12 NOTE — Progress Notes (Addendum)
Pharmacy Antibiotic Note  Kelsey Leonard is a 63 y.o. female with PMH of HTN, asthma, LLE DVT 05/2017, CKD 4, schizophrenia, depression, and hypothyroidism who transferred to [facility] from [facility] on 7/25. She initially presented to [place] on 7/20 for lip swelling. She was found to have hypoxia (SpO2 50%) with several weeks of dyspnea, leg swelling, HTN emergency, CHF, and AMS. Bcx on 07/20 from [place] grew MRSA . Pharmacy has been consulted for vancomcyin dosing due to MRSA bacteremia.  Spoke to pharmacist at [place] last received Vancomycin 1gm IV X 1 on 07/25 at 0646.  Plan: Pulse dose vancomycin - Unstable renal fxn per notes from [place]. Most recent Scr was 2.70 with am labs on 07/25. Check random vancomycin level with AM labs on 07/26. Will adjust dose based on random vancomycin level and renal function. Continue to monitor clinical status, WBC, Temp, and Scr.  Height: 5\' 1"  (154.9 cm) Weight: 220 lb 10.9 oz (100.1 kg) IBW/kg (Calculated) : 47.8  Temp (24hrs), Avg:98.8 F (37.1 C), Min:98.8 F (37.1 C), Max:98.8 F (37.1 C)  No results for input(s): WBC, CREATININE, LATICACIDVEN, VANCOTROUGH, VANCOPEAK, VANCORANDOM, GENTTROUGH, GENTPEAK, GENTRANDOM, TOBRATROUGH, TOBRAPEAK, TOBRARND, AMIKACINPEAK, AMIKACINTROU, AMIKACIN in the last 168 hours.  CrCl cannot be calculated (Patient's most recent lab result is older than the maximum 21 days allowed.).    Allergies  Allergen Reactions  . Aspirin Nausea And Vomiting  . Penicillins Hives    Has patient had a PCN reaction causing immediate rash, facial/tongue/throat swelling, SOB or lightheadedness with hypotension: Yes Has patient had a PCN reaction causing severe rash involving mucus membranes or skin necrosis: Yes Has patient had a PCN reaction that required hospitalization: Yes Has patient had a PCN reaction occurring within the last 10 years: No If all of the above answers are "NO", then may proceed with  Cephalosporin use.   . Shellfish Allergy Other (See Comments)  . Sulfa Antibiotics Rash    Antimicrobials this admission: Vancomycin 07/20 >>   Microbiology results: 07/20 BCx: Positive for MRSA 07/25 BCx: Repeat bcx pending 07/25 MRSA PCR: Positive  Thank you for allowing pharmacy to be a part of this patient's care.  Sherren Kerns, PharmD PGY1 Acute Care Pharmacy Resident [phone] 08/12/2018 5:24 PM   I discussed / reviewed the pharmacy note by Dr. Hillery Aldo and I agree with the resident's findings and plans as documented.  Marguerite Olea, [facility] Clinical Pharmacist Phone [phone]  08/12/2018 5:41 PM

## 2018-08-12 NOTE — Consult Note (Addendum)
NEUROLOGY CONSULT  Reason for Consult:Neurologic opinion for encephalopathy Referring Physician: Dr Sood  CC: unable to give   HPI: Kelsey Leonard is an 63 y.o. female with PMH of asthma, CKD, depression, schizophrenia, GERD, HTN, Osteoarthritis, hypothyroid, recent DVT on anticoagulation, seizures (no ASDs listed in home med list). She initially presented to [facility] on 7/20 with several weeks of dyspnea, leg swelling and wt gain, found to be in CHF and HTN emergency. On 7/25 she was found to have decreased AMS and myoclonic jerks. She was hypoxic/hypercapnic and found to be in septic shock. She was intubated and started on pressors and transferred to [facility] for [facility]CM mgt.    Past Medical History Past Medical History:  Diagnosis Date  . Asthma   . CKD (chronic kidney disease)   . Depression   . DVT (deep venous thrombosis) (HCC)   . GERD (gastroesophageal reflux disease)   . Hypertension   . Hypothyroidism   . Osteoarthritis   . Schizophrenia (HCC)   . Seizures (HCC)     Past Surgical History Past Surgical History:  Procedure Laterality Date  . ABDOMINAL HYSTERECTOMY    . THYROIDECTOMY, PARTIAL      Family History Family History  Problem Relation Age of Onset  . Hypertension Mother   . Hypertension Sister   . Diabetes Sister   . Hypertension Brother   . Diabetes Brother     Social History    reports that she has never smoked. She has never used smokeless tobacco. No history on file for alcohol and drug.  Allergies Allergies  Allergen Reactions  . Aspirin Nausea And Vomiting  . Penicillins Hives    Has patient had a PCN reaction causing immediate rash, facial/tongue/throat swelling, SOB or lightheadedness with hypotension: Yes Has patient had a PCN reaction causing severe rash involving mucus membranes or skin necrosis: Yes Has patient had a PCN reaction that required hospitalization: Yes Has patient had a PCN reaction occurring within the last 10 years: No If  all of the above answers are "NO", then may proceed with Cephalosporin use.   . Shellfish Allergy Other (See Comments)  . Sulfa Antibiotics Rash    Home Medications Medications Prior to Admission  Medication Sig Dispense Refill  . apixaban (ELIQUIS) 5 MG TABS tablet Take 2 tablets (10 mg total) by mouth 2 (two) times daily. 28 tablet 0  . apixaban (ELIQUIS) 5 MG TABS tablet Take 1 tablet (5 mg total) by mouth 2 (two) times daily. 60 tablet 0  . VESICARE 5 MG tablet Take 5 mg by mouth daily.  1  . VITAMIN E PO Take 1 capsule by mouth daily.      Hospital Medications . chlorhexidine gluconate (MEDLINE KIT)  15 mL Mouth Rinse BID  . Chlorhexidine Gluconate Cloth  6 each Topical Daily  . feeding supplement (PRO-STAT SUGAR FREE 64)  30 mL Per Tube BID  . feeding supplement (VITAL HIGH PROTEIN)  1,000 mL Per Tube Q24H  . mouth rinse  15 mL Mouth Rinse 10 times per day  . pantoprazole sodium  40 mg Per Tube Q24H  . vancomycin variable dose per unstable renal function (pharmacist dosing)   Does not apply See admin instructions     ROS: Unable d/t AMS  Physical Examination: Vitals:   08/12/18 1525 08/12/18 1528 08/12/18 1600 08/12/18 1700  BP: 122/70  (!) 126/102 123/81  Pulse: (!) 56  66 (!) 59  Resp: 19  18 16  Temp: 98.8   F (37.1 C)     TempSrc: Axillary     SpO2: 99%  98% 99%  Weight:  100.1 kg  100.1 kg  Height:    5' 1" (1.549 m)    General - obese; critically ill; intubated Heart - Regular rate and rhythm - no murmer Lungs - Clear to auscultation, vented, intubated Abdomen - Soft - non tender Extremities - Distal pulses intact - no edema Skin - Warm and dry  Neurologic Examination:  Mental Status:  With propofol held for nearly 1h: GCS 6t: E-1, V-1, M4  No spontaneous eye opening, but did seems to resist with passive evaluation. Upward gaze noted, PERRL, 67m. Did not track, did not attend or follow commands. She did grimace to pain in all ext. There was motor  movement with withdrawal to noxious stim seen in all ext.   LABORATORY STUDIES:  Basic Metabolic Panel: Recent Labs  Lab 08/12/18 1609 08/12/18 1643  NA 138  --   K 4.1  --   MG  --  2.1  PHOS  --  1.9*    Liver Function Tests: No results for input(s): AST, ALT, ALKPHOS, BILITOT, PROT, ALBUMIN in the last 168 hours. No results for input(s): LIPASE, AMYLASE in the last 168 hours. No results for input(s): AMMONIA in the last 168 hours.  CBC: Recent Labs  Lab 08/12/18 1609  HGB 16.3*  HCT 48.0*    Cardiac Enzymes: No results for input(s): CKTOTAL, CKMB, CKMBINDEX, TROPONINI in the last 168 hours.  BNP: Invalid input(s): POCBNP  CBG: Recent Labs  Lab 08/12/18 1516  GLUCAP 119*    Microbiology:   Coagulation Studies: No results for input(s): LABPROT, INR in the last 72 hours.  Urinalysis: No results for input(s): COLORURINE, LABSPEC, PHURINE, GLUCOSEU, HGBUR, BILIRUBINUR, KETONESUR, PROTEINUR, UROBILINOGEN, NITRITE, LEUKOCYTESUR in the last 168 hours.  Invalid input(s): APPERANCEUR  Lipid Panel:  No results found for: CHOL, TRIG, HDL, CHOLHDL, VLDL, LDLCALC  HgbA1C:  No results found for: HGBA1C  Urine Drug Screen:  No results found for: LABOPIA, COCAINSCRNUR, LABBENZ, AMPHETMU, THCU, LABBARB   Alcohol Level:  No results for input(s): ETH in the last 168 hours.   IMAGING: Dg Abd Portable 1v  Result Date: 08/12/2018 CLINICAL DATA:  OG tube placement. EXAM: PORTABLE ABDOMEN - 1 VIEW COMPARISON:  None. FINDINGS: An OG tube is identified with tip overlying the mid stomach. Bowel gas pattern is unremarkable. IMPRESSION: OG tube with tip overlying the mid stomach. Electronically Signed   By: JMargarette CanadaM.D.   On: 08/12/2018 16:36   Assessment/Plan: This is a 89yrld who [facility] several weeks of dyspnea, leg swelling and wt gain, found to be in CHF and HTN emergency. On 7/25 she was found to have decreased AMS and  myoclonic jerks. She was hypoxic/hypercapnic and found to be in septic shock. [facility] intubated and [facility]    # Encephalopaty- multiple reasons for decreased LOC and AMS in this critically ill lady given septic shock, hypoxia, hypercarbia, CHF, HTN emergency. Most likely toxic-metabolic and infection as cause. Other ddx: PRES, CNS infection, septic emboli  - EEG: This study is suggestive of moderate diffuse encephalopathy. No seizures or epileptiform discharges were seen throughout the recording.  # Bacteremia and Septic shock- Staph aureus, abx started. Should consider septic emboli or CNS infection. Ck Echo for vegetation. Currently on pressors, vent suport # CHF and acute  respiratory failure- intubated, vented per CCM # HTN- presented HTN emergency. Currently hypotensive in setting of shock # AKI on CKD4- may take more time for encephalopathy to clear and add to current exam.  # Left leg DVT- on Eliquis at home. Ginven no PO, currently on heparin gtt. Will need to use caution with anticoagulation if septic emboli seen  RECS: Routine EEG Hold off on ASDs for now MRI brain w/wo Echo Supportive care Ck NH4 level in am We will follow with you Limit sedation as able   Attending neurologist's note to follow  Desiree Metzger-Cihelka, ARNP-C, ANVP-BC Pager: [phone]   NEUROHOSPITALIST ADDENDUM Performed a face to face diagnostic evaluation.   I have reviewed the contents of history and physical exam as documented by PA/ARNP/Resident and agree with above documentation.  I have discussed and formulated the above plan as documented. Edits to the note have been made as needed.  63 year old female transferred from [facility] for worsening encephalopathy, hypoxic and hypercarbic respiratory failure.  She was intubated and transferred to [facility] for critical care management.  Noted to be in septic shock with bacteremia at [facility].  Neurology was consulted for myoclonic jerks and altered mental status at [place].  On assessment here, patient is moving all 4 extremities purposefully and the is arousing after stopping propofol.  Stat EEG suggestive of diffuse encephalopathy with no epileptiform discharges throughout the recording.  Suspect her myoclonic jerks may be from hypercapnia. Recommend MRI brain to rule out stroke septic emboli.  Do not recommend antiplatelets.  Continue to treat sepsis.     Karena Addison  MD [facility] [phone]   If 7pm to 7am, please call on call as listed on AMION.

## 2018-08-13 ENCOUNTER — Inpatient Hospital Stay (HOSPITAL_COMMUNITY): Payer: Medicaid Other

## 2018-08-13 DIAGNOSIS — Z886 Allergy status to analgesic agent status: Secondary | ICD-10-CM

## 2018-08-13 DIAGNOSIS — R7881 Bacteremia: Secondary | ICD-10-CM

## 2018-08-13 DIAGNOSIS — Z880 Allergy status to penicillin: Secondary | ICD-10-CM

## 2018-08-13 DIAGNOSIS — Z91013 Allergy to seafood: Secondary | ICD-10-CM

## 2018-08-13 DIAGNOSIS — Z881 Allergy status to other antibiotic agents status: Secondary | ICD-10-CM

## 2018-08-13 DIAGNOSIS — B9562 Methicillin resistant Staphylococcus aureus infection as the cause of diseases classified elsewhere: Secondary | ICD-10-CM

## 2018-08-13 DIAGNOSIS — Z9911 Dependence on respirator [ventilator] status: Secondary | ICD-10-CM

## 2018-08-13 DIAGNOSIS — B9561 Methicillin susceptible Staphylococcus aureus infection as the cause of diseases classified elsewhere: Secondary | ICD-10-CM

## 2018-08-13 LAB — CBC
HCT: 42.1 % (ref 36.0–46.0)
Hemoglobin: 13.2 g/dL (ref 12.0–15.0)
MCH: 28.3 pg (ref 26.0–34.0)
MCHC: 31.4 g/dL (ref 30.0–36.0)
MCV: 90.3 fL (ref 80.0–100.0)
Platelets: 94 10*3/uL — ABNORMAL LOW (ref 150–400)
RBC: 4.66 MIL/uL (ref 3.87–5.11)
RDW: 17.2 % — ABNORMAL HIGH (ref 11.5–15.5)
WBC: 7.2 10*3/uL (ref 4.0–10.5)
nRBC: 0 % (ref 0.0–0.2)

## 2018-08-13 LAB — ECHOCARDIOGRAM COMPLETE
Height: 61 in
Weight: 3559.11 oz

## 2018-08-13 LAB — GLUCOSE, CAPILLARY
Glucose-Capillary: 111 mg/dL — ABNORMAL HIGH (ref 70–99)
Glucose-Capillary: 115 mg/dL — ABNORMAL HIGH (ref 70–99)
Glucose-Capillary: 116 mg/dL — ABNORMAL HIGH (ref 70–99)
Glucose-Capillary: 118 mg/dL — ABNORMAL HIGH (ref 70–99)
Glucose-Capillary: 122 mg/dL — ABNORMAL HIGH (ref 70–99)
Glucose-Capillary: 123 mg/dL — ABNORMAL HIGH (ref 70–99)

## 2018-08-13 LAB — COMPREHENSIVE METABOLIC PANEL
ALT: 42 U/L (ref 0–44)
AST: 111 U/L — ABNORMAL HIGH (ref 15–41)
Albumin: 3.1 g/dL — ABNORMAL LOW (ref 3.5–5.0)
Alkaline Phosphatase: 110 U/L (ref 38–126)
Anion gap: 10 (ref 5–15)
BUN: 38 mg/dL — ABNORMAL HIGH (ref 6–20)
CO2: 36 mmol/L — ABNORMAL HIGH (ref 22–32)
Calcium: 9.3 mg/dL (ref 8.9–10.3)
Chloride: 92 mmol/L — ABNORMAL LOW (ref 98–111)
Creatinine, Ser: 3.16 mg/dL — ABNORMAL HIGH (ref 0.44–1.00)
GFR calc Af Amer: 18 mL/min — ABNORMAL LOW (ref >60)
GFR calc non Af Amer: 15 mL/min — ABNORMAL LOW (ref >60)
Glucose, Bld: 137 mg/dL — ABNORMAL HIGH (ref 70–99)
Potassium: 3.9 mmol/L (ref 3.5–5.1)
Sodium: 138 mmol/L (ref 135–145)
Total Bilirubin: 2.3 mg/dL — ABNORMAL HIGH (ref 0.3–1.2)
Total Protein: 6.3 g/dL — ABNORMAL LOW (ref 6.5–8.1)

## 2018-08-13 LAB — APTT
aPTT: 108 seconds — ABNORMAL HIGH (ref 24–36)
aPTT: 138 seconds — ABNORMAL HIGH (ref 24–36)
aPTT: 122 seconds — ABNORMAL HIGH (ref 24–36)

## 2018-08-13 LAB — PHOSPHORUS
Phosphorus: 2.6 mg/dL (ref 2.5–4.6)
Phosphorus: 2.1 mg/dL — ABNORMAL LOW (ref 2.5–4.6)

## 2018-08-13 LAB — MAGNESIUM
Magnesium: 2.1 mg/dL (ref 1.7–2.4)
Magnesium: 2.2 mg/dL (ref 1.7–2.4)

## 2018-08-13 LAB — VANCOMYCIN, RANDOM: Vancomycin Rm: 37

## 2018-08-13 LAB — TRIGLYCERIDES: Triglycerides: 49 mg/dL (ref <150)

## 2018-08-13 LAB — AMMONIA: Ammonia: 19 umol/L (ref 9–35)

## 2018-08-13 MED ORDER — PERFLUTREN LIPID MICROSPHERE
1.0000 mL | INTRAVENOUS | Status: AC | PRN
  Administered 2018-08-13: 2 mL via INTRAVENOUS
  Filled 2018-08-13: qty 10

## 2018-08-13 NOTE — Progress Notes (Signed)
Pharmacy Antibiotic Note  Kelsey Leonard is a 63 y.o. female transferred from [place] on 08/12/2018 with MRSA bacteremia. ID consulted for evaluation and work-up. Pt with AKI - last Vancomycin dose at [place] on 7/25 AM with a Vancomycin random level of 37 mcg/ml this AM. Pharmacy has been consulted to transition to Daptomycin.   Will wait for the Vancomycin level to trend down to <20 mcg/ml prior to initiating Daptomycin. No dose needed today.  Plan: - Will plan to start Daptomycin when Vancomycin levels are <20 mcg/ml - No Daptomycin dose needed for now - Will obtain a Vancomycin random on 7/27 AM to evaluate clearance - Will continue to follow renal function, culture results, LOT, and antibiotic de-escalation plans   Height: 5\' 1"  (154.9 cm) Weight: 222 lb 7.1 oz (100.9 kg) IBW/kg (Calculated) : 47.8  Temp (24hrs), Avg:99.1 F (37.3 C), Min:98.2 F (36.8 C), Max:99.9 F (37.7 C)  Recent Labs  Lab 08/13/18 0347  WBC 7.2  CREATININE 3.16*  VANCORANDOM 37    Estimated Creatinine Clearance: 20.6 mL/min (A) (by C-G formula based on SCr of 3.16 mg/dL (H)).    Allergies  Allergen Reactions  . Aspirin Nausea And Vomiting  . Penicillins Hives    Has patient had a PCN reaction causing immediate rash, facial/tongue/throat swelling, SOB or lightheadedness with hypotension: Yes Has patient had a PCN reaction causing severe rash involving mucus membranes or skin necrosis: Yes Has patient had a PCN reaction that required hospitalization: Yes Has patient had a PCN reaction occurring within the last 10 years: No If all of the above answers are "NO", then may proceed with Cephalosporin use.   . Shellfish Allergy Other (See Comments)  . Sulfa Antibiotics Rash    Vancomycin 07/20>>7/26 - 7/26 VR 37 mcg/ml Dapto (to start when VR<20 mcg/ml) >>  Kelsey Leonard: 7/20BCx [name]) >>MRSA (called [facility] to confirm) 7//22 BCx (RH) >> ngtd  [name]: 7/25 BCx: ngtd 7/25 MRSA PCR:  Positive 7/25 RCx >> GS with GPC/GNR >> pending  Thank you for allowing pharmacy to be a part of this patient's care.  Alycia Rossetti, PharmD, BCPS Clinical Pharmacist Clinical phone for 08/13/2018: [phone] 08/13/2018 1:41 PM   **Pharmacist phone directory can now be found on [email or web address] ([email or web address]).  Listed under [place].

## 2018-08-13 NOTE — Progress Notes (Signed)
ANTICOAGULATION CONSULT NOTE - Initial Consult  Pharmacy Consult for heparin due to history of DVT Indication: H/O DVT on Apixaban PTA  Allergies  Allergen Reactions  . Aspirin Nausea And Vomiting  . Penicillins Hives    Has patient had a PCN reaction causing immediate rash, facial/tongue/throat swelling, SOB or lightheadedness with hypotension: Yes Has patient had a PCN reaction causing severe rash involving mucus membranes or skin necrosis: Yes Has patient had a PCN reaction that required hospitalization: Yes Has patient had a PCN reaction occurring within the last 10 years: No If all of the above answers are "NO", then may proceed with Cephalosporin use.   . Shellfish Allergy Other (See Comments)  . Sulfa Antibiotics Rash    Patient Measurements: Height: '5\' 1"'$  (154.9 cm) Weight: 222 lb 7.1 oz (100.9 kg) IBW/kg (Calculated) : 47.8 Heparin Dosing Weight: 71.9 kg  Vital Signs: Temp: 98.2 F (36.8 C) (07/26 1221) Temp Source: Oral (07/26 1221) BP: 118/65 (07/26 1500) Pulse Rate: 54 (07/26 1537)  Labs: Recent Labs    08/12/18 1609 08/12/18 1815 08/13/18 0330 08/13/18 0347 08/13/18 1500  HGB 16.3*  --   --  13.2  --   HCT 48.0*  --   --  42.1  --   PLT  --   --   --  94*  --   APTT  --  31 108*  --  138*  HEPARINUNFRC  --  >2.20*  --   --   --   CREATININE  --   --   --  3.16*  --     Estimated Creatinine Clearance: 20.6 mL/min (A) (by C-G formula based on SCr of 3.16 mg/dL (H)).   Medical History: Past Medical History:  Diagnosis Date  . Asthma   . CKD (chronic kidney disease)   . Depression   . DVT (deep venous thrombosis) ([place])   . GERD (gastroesophageal reflux disease)   . Hypertension   . Hypothyroidism   . Osteoarthritis   . Schizophrenia ([place])   . Seizures (HCC)     Medications:  Scheduled:  . chlorhexidine gluconate (MEDLINE KIT)  15 mL Mouth Rinse BID  . Chlorhexidine Gluconate Cloth  6 each Topical Daily  . feeding supplement (PRO-STAT  SUGAR FREE 64)  30 mL Per Tube BID  . feeding supplement (VITAL HIGH PROTEIN)  1,000 mL Per Tube Q24H  . mouth rinse  15 mL Mouth Rinse 10 times per day  . pantoprazole sodium  40 mg Per Tube Q24H     Assessment: Kelsey Leonard is a 63 y.o. female with PMH of HTN, asthma, LLE DVT 05/2017, CKD 4, schizophrenia, depression, and hypothyroidism who was transferred to [facility] from [facility] on 7/25. She initially presented to [place] on 7/20 for lip swelling. She was found to have hypoxia (SpO2 50%) with several weeks of dyspnea, leg swelling, HTN emergency, and CHF. On 07/25 she was found to have decreased AMS and myoclonic jerks. She was intubated and started on pressors, then transferred to [facility] for CCM management. Pharmacy consulted to dose heparin due to history of DVT. Pt was taking Apixaban '5mg'$  BID PTA. Last dose of apixaban given on 07/25 at 0900 during stay at [facility].   Most recent aPTT is supratherapeutic at 138 seconds, Hg/Hct dropped significantly from 16.3/48 to 13.2/42.1, however they are still WNLs. PLTs are low but appears to be chronic. No S/Sx of bleeding or issues with heparin gtt per RN.    Goal of  Therapy:  Heparin level 0.3-0.7 units/ml aPTT 66-102 seconds Monitor platelets by anticoagulation protocol: Yes   Plan:  Reduce heparin to 900 units/hr Recheck aPTT in 6hr Monitor daily HL and aPTT until levels correlate Monitor CBC daily as Hg/hct dropped significantly and plts are low.  Sherren Kerns, PharmD PGY1 Acute Care Pharmacy Resident [phone] Please check AMION for all [place] numbers 08/13/2018

## 2018-08-13 NOTE — Progress Notes (Signed)
ANTICOAGULATION CONSULT NOTE - Follow Up Consult  Pharmacy Consult for heparin Indication: h/o DVT  Labs: Recent Labs    08/12/18 1609  08/12/18 1815 08/13/18 0330 08/13/18 0347 08/13/18 1500 08/13/18 2200  HGB 16.3*  --   --   --  13.2  --   --   HCT 48.0*  --   --   --  42.1  --   --   PLT  --   --   --   --  94*  --   --   APTT  --    < > 31 108*  --  138* 122*  HEPARINUNFRC  --   --  >2.20*  --   --   --   --   CREATININE  --   --   --   --  3.16*  --   --    < > = values in this interval not displayed.    Assessment: 63yo female supratherapeutic on heparin after rate decrease; of note heparin is running through central line and could be affecting labs; no gtt issues or signs of bleeding per RN.  Goal of Therapy:  aPTT 66-102 seconds   Plan:  Will decrease heparin gtt by 1 unit/kg/hr to 800 units/hr and check PTT in 8 hours; RN will change heparin infusion to PIV.  Wynona Neat, PharmD, BCPS  08/13/2018,11:02 PM

## 2018-08-13 NOTE — Progress Notes (Signed)
Pharmacy Antibiotic Note  Kelsey Leonard is a 63 y.o. female with PMH of HTN, asthma, LLE DVT 05/2017, CKD 4, schizophrenia, depression, and hypothyroidism who transferred to [facility] from [facility] on 7/25. She initially presented to [place] on 7/20 for lip swelling. She was found to have hypoxia (SpO2 50%) with several weeks of dyspnea, leg swelling, HTN emergency, CHF, and AMS. Bcx on 07/20 from [place] grew MRSA . Pharmacy has been consulted for vancomcyin dosing due to MRSA bacteremia.  Spoke to pharmacist at [place] last received Vancomycin 1gm IV X 1 on 07/25 at 0646. Random vancomycin level ~24 hr later elevated at 37 mcg/ml. SCr continues to rise.  Plan: -Hold vancomycin for now    Height: 5\' 1"  (154.9 cm) Weight: 222 lb 7.1 oz (100.9 kg) IBW/kg (Calculated) : 47.8  Temp (24hrs), Avg:99.2 F (37.3 C), Min:98.7 F (37.1 C), Max:99.9 F (37.7 C)  Recent Labs  Lab 08/13/18 0347  VANCORANDOM 37    CrCl cannot be calculated (Patient's most recent lab result is older than the maximum 21 days allowed.).    Allergies  Allergen Reactions  . Aspirin Nausea And Vomiting  . Penicillins Hives    Has patient had a PCN reaction causing immediate rash, facial/tongue/throat swelling, SOB or lightheadedness with hypotension: Yes Has patient had a PCN reaction causing severe rash involving mucus membranes or skin necrosis: Yes Has patient had a PCN reaction that required hospitalization: Yes Has patient had a PCN reaction occurring within the last 10 years: No If all of the above answers are "NO", then may proceed with Cephalosporin use.   . Shellfish Allergy Other (See Comments)  . Sulfa Antibiotics Rash    Antimicrobials this admission: Vancomycin 07/20 >>   Dose Adjustments: 7/26 Vancomycin Random = 37 mcg/ml > continue to hold vancomycin  Microbiology results: 07/20 BCx: Positive for MRSA 07/25 BCx: Repeat bcx pending 07/25 MRSA PCR: Positive  Thank  you for allowing pharmacy to be a part of this patient's care.   Arrie Senate, PharmD, BCPS Clinical Pharmacist Please check AMION for all [facility] Pharmacy numbers 08/13/2018

## 2018-08-13 NOTE — Progress Notes (Addendum)
Brief History  52yrAA F with PMH of asthma, CKD, depression, schizophrenia, GERD, HTN, Osteoarthritis, hypothyroid, recent DVT on anticoagulation, seizures (no ASDs listed in home med list). [facility] with several weeks of dyspnea, leg swelling and wt gain, found to be in CHF and HTN emergency. On 7/25 she was found to have decreased AMS and myoclonic jerks. She was hypoxic/hypercapnic and found to be in septic shock. [facility] intubated and [facility]     Subjective Mentation much improved today. Followed brief commands today, more alert off sedation for over 24h now. No further myoclonic jerks seen.  EEG did not show seizures. MRI changes noted   Past Medical History Past Medical History:  Diagnosis Date  . Asthma   . CKD (chronic kidney disease)   . Depression   . [place]   . GERD (gastroesophageal reflux disease)   . Hypertension   . Hypothyroidism   . Osteoarthritis   . Schizophrenia ([place]   . Seizures ([facility]     Past Surgical History Past Surgical History:  Procedure Laterality Date  . ABDOMINAL HYSTERECTOMY    . THYROIDECTOMY, PARTIAL      Allergies Allergies  Allergen Reactions  . Aspirin Nausea And Vomiting  . Penicillins Hives    Has patient had a PCN reaction causing immediate rash, facial/tongue/throat swelling, SOB or lightheadedness with hypotension: Yes Has patient had a PCN reaction causing severe rash involving mucus membranes or skin necrosis: Yes Has patient had a PCN reaction that required hospitalization: Yes Has patient had a PCN reaction occurring within the last 10 years: No If all of the above answers are "NO", then may proceed with Cephalosporin use.   . Shellfish Allergy Other (See Comments)  . Sulfa Antibiotics Rash    Home Medications Medications Prior to Admission  Medication Sig Dispense Refill  . apixaban (ELIQUIS) 5 MG TABS  tablet Take 2 tablets (10 mg total) by mouth 2 (two) times daily. 28 tablet 0  . apixaban (ELIQUIS) 5 MG TABS tablet Take 1 tablet (5 mg total) by mouth 2 (two) times daily. 60 tablet 0    Hospital Medications . chlorhexidine gluconate (MEDLINE KIT)  15 mL Mouth Rinse BID  . Chlorhexidine Gluconate Cloth  6 each Topical Daily  . feeding supplement (PRO-STAT SUGAR FREE 64)  30 mL Per Tube BID  . feeding supplement (VITAL HIGH PROTEIN)  1,000 mL Per Tube Q24H  . mouth rinse  15 mL Mouth Rinse 10 times per day  . pantoprazole sodium  40 mg Per Tube Q24H  . vancomycin variable dose per unstable renal function (pharmacist dosing)   Does not apply See admin instructions     Objective  Intake/Output from previous day: 07/25 0701 - 07/26 0700 In: 451.8 [I.V.:378.4; NG/GT:73.3] Out: 700 [Urine:700] Intake/Output this shift: Total I/O In: 228.5 [I.V.:108.5; NG/GT:120] Out: 250 [Urine:250] Nutritional status:  Diet Order            Diet NPO time specified  Diet effective now               Physical Exam -  Vitals:   08/13/18 1104 08/13/18 1130 08/13/18 1200 08/13/18 1221  BP:  117/70 (!) 103/51   Pulse: (!) 46 (!) 55 62   Resp:  16 16   Temp:    98.2 F (36.8 C)  TempSrc:    Oral  SpO2: 96%  96% 96%   Weight:      Height:       General - obese, critically ill, intubated Heart - Regular rate and rhythm Lungs - Clear to auscultation Abdomen - Soft - non tender Extremities - Distal pulses intact - no edema Skin - Warm and dry  Neurologic Exam:  GCS 11t  E4,V1, M6 Alert, eyes open, tracked briefly. With much prompting, she was able to show me thumbs up, wiggle toes. PERRL, 37m. Unable to lift legs against gravity, but localized to noxious stimuli. Both arms move purposefully to tubes/lines.    LABORATORY RESULTS:  Basic Metabolic Panel: Recent Labs  Lab 08/12/18 1609 08/12/18 1643 08/13/18 0347  NA 138  --  138  K 4.1  --  3.9  CL  --   --  92*  CO2  --   --   36*  GLUCOSE  --   --  137*  BUN  --   --  38*  CREATININE  --   --  3.16*  CALCIUM  --   --  9.3  MG  --  2.1 2.1  PHOS  --  1.9* 2.6    Liver Function Tests: Recent Labs  Lab 08/13/18 0347  AST 111*  ALT 42  ALKPHOS 110  BILITOT 2.3*  PROT 6.3*  ALBUMIN 3.1*   No results for input(s): LIPASE, AMYLASE in the last 168 hours. Recent Labs  Lab 08/13/18 0347  AMMONIA 19    CBC: Recent Labs  Lab 08/12/18 1609 08/13/18 0347  WBC  --  7.2  HGB 16.3* 13.2  HCT 48.0* 42.1  MCV  --  90.3  PLT  --  94*    Cardiac Enzymes: No results for input(s): CKTOTAL, CKMB, CKMBINDEX, TROPONINI in the last 168 hours.  Lipid Panel: Recent Labs  Lab 08/13/18 0347  TRIG 49    CBG: Recent Labs  Lab 08/12/18 1516 08/12/18 1951 08/12/18 2352 08/13/18 0317 08/13/18 0734  GLUCAP 119* 117* 122* 118* 111*    Microbiology:   Coagulation Studies: No results for input(s): LABPROT, INR in the last 72 hours.  Miscellaneous Labs:   IMAGING RESULTS Mr Brain Wo Contrast  Result Date: 08/13/2018 CLINICAL DATA:  Encephalopathy with bacteremia and hypoxia EXAM: MRI HEAD WITHOUT CONTRAST TECHNIQUE: Multiplanar, multiecho pulse sequences of the brain and surrounding structures were obtained without intravenous contrast. COMPARISON:  None. FINDINGS: Brain: T2 hyperintensity in the bilateral globus pallidus as seen with mineralization, which is confirmed on other sequences. No superimposed diffusion abnormality or other deep gray nucleus signal abnormality as expected for toxic/metabolic insult. T2 hyperintensity in the pons that is indistinct and without expansion or diffusion abnormality. Appearance is most consistent with chronic microvascular ischemia. Would expect different shape, diffusion signal, or extra pontine findings if this were toxic/metabolic. Mild periventricular FLAIR hyperintensity attributed to the same. No focal cortical finding to implicate a seizure focus. Normal brain  volume. No acute infarct, acute hemorrhage, hydrocephalus, or masslike finding Vascular: Major flow voids are preserved Skull and upper cervical spine: Negative for focal marrow lesion Sinuses/Orbits: Bilateral mastoid opacification with mastoidectomy on the right. There may be associated restricted diffusion on the right. Negative nasopharynx. Retention cysts in the left maxillary sinus. IMPRESSION: 1. Likely no acute finding. 2. Signal abnormality in the pons is most likely from chronic small vessel ischemia. Posterior reversible encephalopathy syndrome is considered given history of hypertension but usually not limited to the pons. 3. Bilateral mastoid opacification with right mastoidectomy.  A cholesteatoma could be present on the right given diffusion signal. Electronically Signed   By: Monte Fantasia M.D.   On: 08/13/2018 09:10   Dg Chest Port 1 View  Result Date: 08/12/2018 CLINICAL DATA:  Respiratory failure OG tube check PPE: I donned gloves and surgical mask Same for heather EXAM: PORTABLE CHEST 1 VIEW COMPARISON:  08/12/2018 FINDINGS: Endotracheal tube is in place with tip approximately 2 centimeters above the carina. A RIGHT central venous line tip overlies the superior vena cava. Nasogastric tube is in place, tip beyond the image beyond the gastroesophageal junction. The heart is enlarged. There is pulmonary vascular congestion, increased since the prior study. No focal consolidations. No pleural effusions. IMPRESSION: 1. Cardiomegaly and increased vascular congestion. 2. Lines and tubes as described. Electronically Signed   By: Nolon Nations M.D.   On: 08/12/2018 18:23   Dg Abd Portable 1v  Result Date: 08/12/2018 CLINICAL DATA:  OG tube placement. EXAM: PORTABLE ABDOMEN - 1 VIEW COMPARISON:  None. FINDINGS: An OG tube is identified with tip overlying the mid stomach. Bowel gas pattern is unremarkable. IMPRESSION: OG tube with tip overlying the mid stomach. Electronically Signed   By:  Margarette Canada M.D.   On: 08/12/2018 16:36     EEG: This study is suggestive of moderate diffuse encephalopathy. No seizures or epileptiform discharges were seen throughout the recording.  Transthoracic Echocardiogram: pending  Assessment/Plan: This is a 63[facility]rold [facility] several weeks of dyspnea, leg swelling and wt gain, found to be in CHF and HTN emergency. On 7/25 she was found to have decreased AMS and myoclonic jerks. She was hypoxic/hypercapnic and found to be in septic shock. She was intubated and started [facility] pressors [facility]    # Encephalopaty- multiple reasons for decreased LOC and AMS in this critically ill lady given septic shock, hypoxia, hypercarbia, CHF, HTN emergency. Most likely toxic-metabolic and infection as cause. Other ddx: PRES, CNS infection, septic emboli, HIE. She is now much improved. No reason to think she would not continue to improve neurologically once underlying critical illness is improved. NH4 is wnl.             - EEG: This studyis suggestive of moderate diffuse encephalopathy.No seizures or epileptiform discharges were seen throughout the recording.  -MRI: There is T2 changes pons reflective of chronic microvascular disease, no DWI changes makes it less likely HIE # Myoclonus- none seen currently. This can be seen with toxic-metabolic events. No ominous signs on EEG # Bacteremia and Septic shock- Staph aureus, abx started. Currently on pressors, vent suport # CHF and acute respiratory failure- intubated, vented per CCM # HTN- presented HTN emergency. Currently hypotensive in setting of shock. ddx PRES # AKI on CKD4- may cause a delay in time for encephalopathy to clear  # Left leg DVT- on Eliquis at home. Ginven no PO, currently on heparin gtt. Will need to use caution with anticoagulation if septic emboli seen  RECS: Supportive care as you are Correct underlying metabolic  issues Treat infection Limit sedation as able  She is now much improved. No reason to think she would not continue to improve neurologically once underlying critical illness is improved. However, expect mentation to lag behind any corrections/tx given her degree of underlying comorbidity and AKI etc.   Desiree Metzger-Cihelka, ARNP-C, ANVP-BC Pager: [phone]  NEUROHOSPITALIST ADDENDUM Performed a face to face diagnostic evaluation.   I have  reviewed the contents of history and physical exam as documented by PA/ARNP/Resident and agree with above documentation.  I have discussed and formulated the above plan as documented. Edits to the note have been made as needed.  The patient remains intubated but is awake and following commands.  Comprehension seems to be intact as patient was able to raise her left hand versus right hand to fairly complex questions.  No focal weakness the.  No visual field deficits.  MRI brain reviewed shows no acute findings.  There may be some calcium in the globus pallidus and small vessel disease.  EEG obtained did not show epileptiform discharges.  No evidence of septic emboli or hypoxic injury. I suspect her presentation at [facility] was due to septic shock and bacteremia resulting in hypoxia/hypercapnic respiratory failure resulting in myoclonus and severe encephalopathy.   She does have prior history of seizures listed in her past medical history, however does not appear to be on any seizure medications at home.  Is unclear if the seizures were provoked.  However, her presentation at this time I feel her myoclonic jerks pain were more likely from respiratory failure rather than seizure.  We will not start her on AED at this time.  Patient can follow-up without patient neurology for further evaluation and management if she has history of seizures    Macarena Langseth MD [facility] [phone]   If 7pm to 7am, please call on call as listed  on AMION.

## 2018-08-13 NOTE — Progress Notes (Signed)
ANTICOAGULATION CONSULT NOTE - Initial Consult  Pharmacy Consult for heparin due to history of DVT Indication: H/O DVT on Apixaban PTA  Allergies  Allergen Reactions  . Aspirin Nausea And Vomiting  . Penicillins Hives    Has patient had a PCN reaction causing immediate rash, facial/tongue/throat swelling, SOB or lightheadedness with hypotension: Yes Has patient had a PCN reaction causing severe rash involving mucus membranes or skin necrosis: Yes Has patient had a PCN reaction that required hospitalization: Yes Has patient had a PCN reaction occurring within the last 10 years: No If all of the above answers are "NO", then may proceed with Cephalosporin use.   . Shellfish Allergy Other (See Comments)  . Sulfa Antibiotics Rash    Patient Measurements: Height: '5\' 1"'$  (154.9 cm) Weight: 222 lb 7.1 oz (100.9 kg) IBW/kg (Calculated) : 47.8 Heparin Dosing Weight: 71.9 kg  Vital Signs: Temp: 99.2 F (37.3 C) (07/26 0319) Temp Source: Oral (07/26 0319) BP: 111/82 (07/26 0300) Pulse Rate: 57 (07/26 0300)  Labs: Recent Labs    08/12/18 1609 08/12/18 1815 08/13/18 0347  HGB 16.3*  --  13.2  HCT 48.0*  --  42.1  PLT  --   --  PENDING  APTT  --  31  --   HEPARINUNFRC  --  >2.20*  --   CREATININE  --   --  3.16*    Estimated Creatinine Clearance: 20.6 mL/min (A) (by C-G formula based on SCr of 3.16 mg/dL (H)).   Medical History: Past Medical History:  Diagnosis Date  . Asthma   . CKD (chronic kidney disease)   . Depression   . DVT (deep venous thrombosis) ([place])   . GERD (gastroesophageal reflux disease)   . Hypertension   . Hypothyroidism   . Osteoarthritis   . Schizophrenia ([place])   . Seizures ([facility])     Medications:  Scheduled:  . chlorhexidine gluconate (MEDLINE KIT)  15 mL Mouth Rinse BID  . Chlorhexidine Gluconate Cloth  6 each Topical Daily  . feeding supplement (PRO-STAT SUGAR FREE 64)  30 mL Per Tube BID  . feeding supplement (VITAL HIGH PROTEIN)  1,000 mL  Per Tube Q24H  . mouth rinse  15 mL Mouth Rinse 10 times per day  . pantoprazole sodium  40 mg Per Tube Q24H  . vancomycin variable dose per unstable renal function (pharmacist dosing)   Does not apply See admin instructions     Assessment: Kelsey Leonard is a 63 y.o. female with PMH of HTN, asthma, LLE DVT 05/2017, CKD 4, schizophrenia, depression, and hypothyroidism who was transferred to [facility] from [facility] on 7/25. She initially presented to [place] on 7/20 for lip swelling. She was found to have hypoxia (SpO2 50%) with several weeks of dyspnea, leg swelling, HTN emergency, and CHF. On 07/25 she was found to have decreased AMS and myoclonic jerks. She was intubated and started on pressors CCM management at [facility] Pharmacy consulted to dose heparin due to history of DVT. Pt was taking Apixaban '5mg'$  BID PTA. Last dose of apixaban given on 07/25 at 0900 during stay at [facility]. Hg/Hct are normal at 16.3/48.   Initial aPTT slightly supratherapeutic ay 108 seconds, H/H wnl, pltc low but appears to be chronic. No S/Sx bleeding per RN.  Goal of Therapy:  Heparin level 0.3-0.7 units/ml aPTT 66-102 seconds Monitor platelets by anticoagulation protocol: Yes   Plan:  Reduce heparin to 1050 units/hr Recheck aPTT in 8hr   Arrie Senate, PharmD, BCPS Clinical  Pharmacist Please check AMION for all [facility] Pharmacy numbers 08/13/2018

## 2018-08-13 NOTE — Progress Notes (Signed)
NAME:  Kelsey Leonard, MRN:  [id number], DOB:  [birth year], LOS: 1 ADMISSION DATE:  08/12/2018, CONSULTATION DATE:  08/12/2018 REFERRING MD:  Dr. Roger Shelter, [place]:  Altered mental status   Brief History   63 yo female presented to [facility] 7/20 with lip swelling.  Found to have hypoxia (SpO2 50%) with several weeks of dyspnea, leg swelling and weight gain with blood pressure 201/95.  COVID testing negative.  Treated for HTN emergency and CHF.  Found to have Staph aureus bacteremia and started on antibiotics.  She was noted to have worsening mental status with myoclonic jerks and hypoxia/hypercapnia on 7/25.  She was intubated, started on pressors and transferred to [facility]  Past Medical History  HTN, Asthma, Lt leg DVT May 2019, CKD 4, Schizophrenia, Depression, Hypothyroidism  Significant Hospital Events   7/25 Transfer from [facility] 7/26 off pressors  Consults:  Neurology 7/25 AMS   Procedures:  ETT 7/25 >> Rt [place] CVL 7/25 >>   Significant Diagnostic Tests:  Echo 08/08/18 >> mild LVH, EF 55 to 60%, septal flattening in systole, mod RV enlargement CT head 7/25 >> EEG 7/25 >> moderate diffuse encephalopathy.  No seizure activity. MRI brain 7/26 >> T2 hyperintensity in b/l GB, hyperintensity in pons likely from chronic microvascular ischemia versus PRES  Micro Data:  Blood 7/20 [name]) >> MRSA COVID 7/20 [name]) >> Negative Blood 7/25 >> Sputum 7/25 >>   Antimicrobials:  Vancomycin 7/20 >>   Interim history/subjective:  Remains on vent.  Not getting sedation.  Objective   Blood pressure 116/67, pulse (!) 48, temperature 99.8 F (37.7 C), temperature source Oral, resp. rate 16, height 5\' 1"  (1.549 m), weight 100.9 kg, SpO2 98 %. CVP:  [9 mmHg-15 mmHg] 9 mmHg  Vent Mode: PRVC FiO2 (%):  [40 %] 40 % Set Rate:  [16 bmp-18 bmp] 16 bmp Vt Set:  [380 mL] 380 mL PEEP:  [5 cmH20] 5 cmH20 Plateau Pressure:  [16 cmH20-30 cmH20] 16 cmH20    Intake/Output Summary (Last 24 hours) at 08/13/2018 1048 Last data filed at 08/13/2018 0900 Gross per 24 hour  Intake 558.79 ml  Output 700 ml  Net -141.21 ml   Filed Weights   08/12/18 1528 08/12/18 1700 08/13/18 0358  Weight: 100.1 kg 100.1 kg 100.9 kg    Examination:  General - unresponsive Eyes - pupils reactive, upward gaze ENT - ETT in place Cardiac - regular, bradycardic Chest - equal breath sounds b/l, no wheezing or rales Abdomen - soft, non tender, + bowel sounds Extremities - no cyanosis, clubbing, or edema Skin - no rashes Neuro - withdraws to painful stimuli, not following commands  Resolved Hospital Problem list   Septic shock  Assessment & Plan:   Acute on chronic hypoxic, hypercapnic respiratory failure. Hx of asthma. - likely has underlying hypoventilation exacerbated by acute illness - continue full vent support; mental status barrier to vent weaning - f/u CXR - prn BDs  Acute metabolic encephalopathy with myoclonic jerks. Hx of Schizophrenia and depression. - neurology consulted - RASS goal 0  MRSA bacteremia. - continue vancomycin - f/u repeat blood cultures  Hx of HTN. - monitor hemodynamics  AKI from ATN with CKD 4. - uncertain what baseline renal fx is - continue IV fluids - f/u BMET  Thrombocytopenia in setting of sepsis. - f/u CBC  Hx of Lt leg DVT. - hold outpt eliquis - continue heparin gtt per pharmacy while closely watching PLT level  Best practice:  Diet: tube feeds DVT prophylaxis: heparin gtt GI prophylaxis: protonix Mobility: bed rest Code Status: full code Disposition: ICU  Labs    CMP Latest Ref Rng & Units 08/13/2018 08/12/2018 10/18/2016  Glucose 70 - 99 mg/dL 137(H) - 112(H)  BUN 6 - 20 mg/dL 38(H) - 27(H)  Creatinine 0.44 - 1.00 mg/dL 3.16(H) - 2.07(H)  Sodium 135 - 145 mmol/L 138 138 138  Potassium 3.5 - 5.1 mmol/L 3.9 4.1 4.5  Chloride 98 - 111 mmol/L 92(L) - 108  CO2 22 - 32 mmol/L 36(H) - 24  Calcium  8.9 - 10.3 mg/dL 9.3 - 8.8(L)  Total Protein 6.5 - 8.1 g/dL 6.3(L) - 6.3(L)  Total Bilirubin 0.3 - 1.2 mg/dL 2.3(H) - 0.5  Alkaline Phos 38 - 126 U/L 110 - 73  AST 15 - 41 U/L 111(H) - 21  ALT 0 - 44 U/L 42 - 11(L)   CBC Latest Ref Rng & Units 08/13/2018 08/12/2018 10/18/2016  WBC 4.0 - 10.5 K/uL 7.2 - 5.1  Hemoglobin 12.0 - 15.0 g/dL 13.2 16.3(H) 11.8(L)  Hematocrit 36.0 - 46.0 % 42.1 48.0(H) 37.3  Platelets 150 - 400 K/uL 94(L) - 102(L)   ABG    Component Value Date/Time   PHART 7.475 (H) 08/12/2018 1609   PCO2ART 60.5 (H) 08/12/2018 1609   PO2ART 92.0 08/12/2018 1609   HCO3 44.5 (H) 08/12/2018 1609   TCO2 46 (H) 08/12/2018 1609   O2SAT 97.0 08/12/2018 1609   CBG (last 3)  Recent Labs    08/12/18 2352 08/13/18 0317 08/13/18 0734  GLUCAP 122* 118* 111*    CC time 34 minutes  Chesley Mires, MD [place] 08/13/2018, 10:56 AM

## 2018-08-13 NOTE — Progress Notes (Signed)
  Echocardiogram 2D Echocardiogram has been performed.  Kelsey Leonard 08/13/2018, 3:13 PM

## 2018-08-13 NOTE — Consult Note (Signed)
[place] for Infectious Disease       Reason for Consult:  Staph aureus bacteremia   Referring Physician: CHAMP autoconsult  Active Problems:   Respiratory failure ([place])   . chlorhexidine gluconate (MEDLINE KIT)  15 mL Mouth Rinse BID  . Chlorhexidine Gluconate Cloth  6 each Topical Daily  . feeding supplement (PRO-STAT SUGAR FREE 64)  30 mL Per Tube BID  . feeding supplement (VITAL HIGH PROTEIN)  1,000 mL Per Tube Q24H  . mouth rinse  15 mL Mouth Rinse 10 times per day  . pantoprazole sodium  40 mg Per Tube Q24H  . vancomycin variable dose per unstable renal function (pharmacist dosing)   Does not apply See admin instructions    Recommendations: Will use daptomycin in place of vancomycin  Assessment: She has Staph aureus in blood cultures at [place] reported to be both MSSA and MRSA and currently on vancomycin.  She has acute renal failure as well and no evidence of pulmonary infection so will use daptomycin to avoid the potential nephrotoxicity from vancomycin.   Antibiotics: vancomycin  HPI: Kelsey Leonard is a 63 y.o. female with several weeks of dypsnea and admitted initially to [facility] and found to have Staph aureus bacteremia.  She was started on vancomycin and repeat blood cultures sent here.  TTE there without vegetation.  Patient currently intubated and unresponsive. No history obtainable from the patient Discussed with Dr. Halford Chessman   Review of Systems:  Unable to be assessed due to mental status  Past Medical History:  Diagnosis Date  . Asthma   . CKD (chronic kidney disease)   . Depression   . DVT (deep venous thrombosis) ([place])   . GERD (gastroesophageal reflux disease)   . Hypertension   . Hypothyroidism   . Osteoarthritis   . Schizophrenia ([place])   . Seizures ([place])     Social History   Tobacco Use  . Smoking status: Never Smoker  . Smokeless tobacco: Never Used  Substance Use Topics  . Alcohol use: Not on file  . Drug use: Not  on file    Family History  Problem Relation Age of Onset  . Hypertension Mother   . Hypertension Sister   . Diabetes Sister   . Hypertension Brother   . Diabetes Brother     Allergies  Allergen Reactions  . Aspirin Nausea And Vomiting  . Penicillins Hives    Has patient had a PCN reaction causing immediate rash, facial/tongue/throat swelling, SOB or lightheadedness with hypotension: Yes Has patient had a PCN reaction causing severe rash involving mucus membranes or skin necrosis: Yes Has patient had a PCN reaction that required hospitalization: Yes Has patient had a PCN reaction occurring within the last 10 years: No If all of the above answers are "NO", then may proceed with Cephalosporin use.   . Shellfish Allergy Other (See Comments)  . Sulfa Antibiotics Rash  all from chart review, unobtainable from the patient  Physical Exam: Constitutional: in no apparent distress  Vitals:   08/13/18 1200 08/13/18 1221  BP: (!) 103/51   Pulse: 62   Resp: 16   Temp:  98.2 F (36.8 C)  SpO2: 96%    EYES: anicteric ENMT: + ET Cardiovascular: Cor Tachy Respiratory: CTA B, anterior exam; respiratory effort on vent GI: Bowel sounds are normal, liver is not enlarged, spleen is not enlarged Musculoskeletal: no pedal edema noted Skin: negatives: no rash Neuro: unresponsvie  Lab Results  Component Value Date  WBC 7.2 08/13/2018   HGB 13.2 08/13/2018   HCT 42.1 08/13/2018   MCV 90.3 08/13/2018   PLT 94 (L) 08/13/2018    Lab Results  Component Value Date   CREATININE 3.16 (H) 08/13/2018   BUN 38 (H) 08/13/2018   NA 138 08/13/2018   K 3.9 08/13/2018   CL 92 (L) 08/13/2018   CO2 36 (H) 08/13/2018    Lab Results  Component Value Date   ALT 42 08/13/2018   AST 111 (H) 08/13/2018   ALKPHOS 110 08/13/2018     Microbiology: Recent Results (from the past 240 hour(s))  MRSA PCR Screening     Status: Abnormal   Collection Time: 08/12/18  3:31 PM   Specimen: Nasopharyngeal   Result Value Ref Range Status   MRSA by PCR POSITIVE (A) NEGATIVE Final    Comment:        The GeneXpert MRSA Assay (FDA approved for NASAL specimens only), is one component of a comprehensive MRSA colonization surveillance program. It is not intended to diagnose MRSA infection nor to guide or monitor treatment for MRSA infections. RESULT CALLED TO, READ BACK BY AND VERIFIED WITH: J.BEABRAUT RN AT 7414 08/12/2018 BY A.DAVIS Performed at [facility], [place] [address] [place], [place] [address]   Culture, blood (Routine X 2) w Reflex to ID Panel     Status: None (Preliminary result)   Collection Time: 08/12/18  4:30 PM   Specimen: BLOOD LEFT ARM  Result Value Ref Range Status   Specimen Description BLOOD LEFT ARM  Final   Special Requests   Final    BOTTLES DRAWN AEROBIC ONLY Blood Culture results may not be optimal due to an inadequate volume of blood received in culture bottles   Culture   Final    NO GROWTH < 24 HOURS Performed at [facility], [place] [address], [place], [place] [address]    Report Status PENDING  Incomplete  Culture, blood (Routine X 2) w Reflex to ID Panel     Status: None (Preliminary result)   Collection Time: 08/12/18  4:43 PM   Specimen: BLOOD LEFT HAND  Result Value Ref Range Status   Specimen Description BLOOD LEFT HAND  Final   Special Requests   Final    BOTTLES DRAWN AEROBIC ONLY Blood Culture results may not be optimal due to an inadequate volume of blood received in culture bottles   Culture   Final    NO GROWTH < 24 HOURS Performed at [facility], [place] [address] [place], [place] [address]    Report Status PENDING  Incomplete  Culture, respiratory (non-expectorated)     Status: None (Preliminary result)   Collection Time: 08/12/18 11:47 PM   Specimen: Endotracheal; Respiratory  Result Value Ref Range Status   Specimen Description ENDOTRACHEAL  Final   Special Requests NONE  Final   Gram Stain   Final    ABUNDANT WBC  PRESENT,BOTH PMN AND MONONUCLEAR FEW GRAM POSITIVE COCCI RARE GRAM NEGATIVE RODS Performed at [facility], [address] [place], [place] [address]    Culture PENDING  Incomplete   Report Status PENDING  Incomplete    Thayer Headings, [place] for Infectious Disease [email or web address] 08/13/2018, 1:22 PM

## 2018-08-14 ENCOUNTER — Inpatient Hospital Stay (HOSPITAL_COMMUNITY): Payer: Medicaid Other

## 2018-08-14 DIAGNOSIS — J45909 Unspecified asthma, uncomplicated: Secondary | ICD-10-CM

## 2018-08-14 DIAGNOSIS — N184 Chronic kidney disease, stage 4 (severe): Secondary | ICD-10-CM

## 2018-08-14 HISTORY — DX: Chronic kidney disease, stage 4 (severe): N18.4

## 2018-08-14 LAB — BLOOD CULTURE ID PANEL (REFLEXED)

## 2018-08-14 LAB — COMPREHENSIVE METABOLIC PANEL
ALT: 29 U/L (ref 0–44)
AST: 49 U/L — ABNORMAL HIGH (ref 15–41)
Albumin: 2.8 g/dL — ABNORMAL LOW (ref 3.5–5.0)
Alkaline Phosphatase: 88 U/L (ref 38–126)
Anion gap: 9 (ref 5–15)
BUN: 50 mg/dL — ABNORMAL HIGH (ref 6–20)
CO2: 35 mmol/L — ABNORMAL HIGH (ref 22–32)
Calcium: 8.9 mg/dL (ref 8.9–10.3)
Chloride: 96 mmol/L — ABNORMAL LOW (ref 98–111)
Creatinine, Ser: 3.2 mg/dL — ABNORMAL HIGH (ref 0.44–1.00)
GFR calc Af Amer: 17 mL/min — ABNORMAL LOW (ref >60)
GFR calc non Af Amer: 15 mL/min — ABNORMAL LOW (ref >60)
Glucose, Bld: 128 mg/dL — ABNORMAL HIGH (ref 70–99)
Potassium: 3.7 mmol/L (ref 3.5–5.1)
Sodium: 140 mmol/L (ref 135–145)
Total Bilirubin: 1.4 mg/dL — ABNORMAL HIGH (ref 0.3–1.2)
Total Protein: 6 g/dL — ABNORMAL LOW (ref 6.5–8.1)

## 2018-08-14 LAB — GLUCOSE, CAPILLARY
Glucose-Capillary: 127 mg/dL — ABNORMAL HIGH (ref 70–99)
Glucose-Capillary: 116 mg/dL — ABNORMAL HIGH (ref 70–99)
Glucose-Capillary: 140 mg/dL — ABNORMAL HIGH (ref 70–99)
Glucose-Capillary: 103 mg/dL — ABNORMAL HIGH (ref 70–99)
Glucose-Capillary: 115 mg/dL — ABNORMAL HIGH (ref 70–99)

## 2018-08-14 LAB — CBC
HCT: 38.9 % (ref 36.0–46.0)
Hemoglobin: 12 g/dL (ref 12.0–15.0)
MCH: 28.3 pg (ref 26.0–34.0)
MCHC: 30.8 g/dL (ref 30.0–36.0)
MCV: 91.7 fL (ref 80.0–100.0)
Platelets: 76 10*3/uL — ABNORMAL LOW (ref 150–400)
RBC: 4.24 MIL/uL (ref 3.87–5.11)
RDW: 17.5 % — ABNORMAL HIGH (ref 11.5–15.5)
WBC: 7.7 10*3/uL (ref 4.0–10.5)
nRBC: 0 % (ref 0.0–0.2)

## 2018-08-14 LAB — APTT: aPTT: 93 seconds — ABNORMAL HIGH (ref 24–36)

## 2018-08-14 LAB — HEPARIN LEVEL (UNFRACTIONATED): Heparin Unfractionated: 1.96 IU/mL — ABNORMAL HIGH (ref 0.30–0.70)

## 2018-08-14 LAB — VANCOMYCIN, RANDOM: Vancomycin Rm: 27

## 2018-08-14 LAB — MAGNESIUM: Magnesium: 2.2 mg/dL (ref 1.7–2.4)

## 2018-08-14 LAB — PHOSPHORUS: Phosphorus: 1.9 mg/dL — ABNORMAL LOW (ref 2.5–4.6)

## 2018-08-14 MED ORDER — PHENOL 1.4 % MT LIQD
1.0000 | OROMUCOSAL | Status: DC | PRN
  Filled 2018-08-14: qty 177

## 2018-08-14 MED ORDER — SODIUM CHLORIDE 0.9 % IV SOLN
8.0000 mg/kg | Freq: Every day | INTRAVENOUS | Status: AC
  Administered 2018-08-15: 817 mg via INTRAVENOUS
  Filled 2018-08-14: qty 16.34

## 2018-08-14 MED ORDER — SODIUM CHLORIDE 0.9 % IV SOLN
8.0000 mg/kg | INTRAVENOUS | Status: DC
  Filled 2018-08-14: qty 16.34

## 2018-08-14 MED ORDER — PANTOPRAZOLE SODIUM 40 MG IV SOLR
40.0000 mg | INTRAVENOUS | Status: DC
  Administered 2018-08-14: 40 mg via INTRAVENOUS
  Filled 2018-08-14: qty 40

## 2018-08-14 NOTE — Progress Notes (Signed)
Pharmacy Antibiotic Note  Kelsey Leonard is a 63 y.o. female transferred from [place] on 08/12/2018 with MRSA bacteremia. ID consulted for evaluation and work-up. Pt with AKI - last Vancomycin dose at [place] on 7/25 AM. Pharmacy has been consulted to transition to Daptomycin.   Vancomycin random level returned at 27 mcg/ml (supratherapeutic). With current renal fx, expect vancomycin levels to return to normal tomorrow morning.  Plan: - Daptomycin 8 mg/kg IV q48h, starting tomorrow morning - Will continue to follow renal function, culture results, LOT   Height: 5\' 1"  (154.9 cm) Weight: 225 lb 1.4 oz (102.1 kg) IBW/kg (Calculated) : 47.8  Temp (24hrs), Avg:98.4 F (36.9 C), Min:98 F (36.7 C), Max:98.9 F (37.2 C)  Recent Labs  Lab 08/13/18 0347 08/14/18 0400  WBC 7.2  --   CREATININE 3.16* 3.20*  VANCORANDOM 37 27    Estimated Creatinine Clearance: 20.5 mL/min (A) (by C-G formula based on SCr of 3.2 mg/dL (H)).     Vancomycin 07/20>>7/26 - 7/26 VR 37 mcg/ml Dapto 7/27 >>  [place]: 7/20BCx [facility]) >>MRSA (called RH to confirm) 7//22 BCx (RH) >> ngtd  [place]: 7/25 BCx: ngtd 7/25 MRSA PCR: Positive 7/25 RCx >> GS with GPC/GNR >> pending  Harvel Quale 08/14/2018 10:32 AM

## 2018-08-14 NOTE — Progress Notes (Signed)
Nutrition Consult  Received MD Consult for TF initiation and management. Patient was extubated this morning. TF no longer needed. Hopeful for diet advancement later today per discussion with RN.  No nutrition interventions indicated at this time. Please re-consult if nutrition concerns arise.  Molli Barrows, RD, LDN, [place] Pager [phone] After Hours Pager [phone]

## 2018-08-14 NOTE — Procedures (Signed)
Extubation Procedure Note  Patient Details:   Name: Kelsey Leonard DOB: [birth year] MRN: [id number]   Airway Documentation:    Vent end date: 08/14/18 Vent end time: 1055   Evaluation  O2 sats: stable throughout Complications: No apparent complications Patient did tolerate procedure well. Bilateral Breath Sounds: Clear   Patient extubated to 4L [place]. Able to cough & speak post extubation.  Kathie Dike 08/14/2018, 11:05 AM

## 2018-08-14 NOTE — Progress Notes (Signed)
NAME:  Kelsey Leonard, MRN:  [id number], DOB:  09-27-1955, LOS: 2 ADMISSION DATE:  08/12/2018, CONSULTATION DATE:  08/12/18 REFERRING MD:  Dr. Roger Shelter, [facility], CHIEF COMPLAINT:  AMS   Brief History   63 yo female presented to [facility] 7/20 with lip swelling.  Found to have hypoxia (SpO2 50%) with several weeks of dyspnea, leg swelling and weight gain with blood pressure 201/95.  COVID testing negative.  Treated for HTN emergency and CHF.  Found to have Staph aureus bacteremia and started on antibiotics.  She was noted to have worsening mental status with myoclonic jerks and hypoxia/hypercapnia on 7/25.  She was intubated, started on pressors and transferred to [facility].  Past Medical History  Presumed Sarcoidosis (never biopsied), HTN, Asthma, Lt leg DVT May 2018, CKD 4, Schizophrenia, Depression, Hypothyroidism  Significant Hospital Events   7/25 Transfer from [facility] 7/26 off pressors  Consults:  Neurology 7/25 AMS   Procedures:  ETT 7/25 >> Rt [place] CV[facility] 7/25 >>   Significant Diagnostic Tests:  Echo 08/08/18 >> mild LVH, EF 55 to 60%, septal flattening in systole, mod RV enlargement CT head 7/25 >> EEG 7/25 >> moderate diffuse encephalopathy.  No seizure activity. MRI brain 7/26 >> T2 hyperintensity in b/l GB, hyperintensity in pons likely from chronic microvascular ischemia versus PRES  Micro Data:  MRSA 7/25 positive  Blood 7/20 [name]) >> MRSA COVID 7/20 [name]) >> Negative Blood 7/25 >> x 2 days >  Sputum 7/25 >> few gram + cocci, rare GNR, reincubated for better growth >  Antimicrobials:  Vancomycin 7/20 >> 7/26 Daptomycin    Interim history/subjective:  NAEO  Objective   Temp:  [98 F (36.7 C)-98.9 F (37.2 C)] 98 F (36.7 C) (07/27 0739) Pulse Rate:  [43-72] 56 (07/27 0800) Resp:  [16-21] 18 (07/27 0800) BP: (92-137)/(47-88) 106/47 (07/27 0800) SpO2:  [94 %-100 %] 94 % (07/27 0800) FiO2 (%):  [40 %] 40 % (07/27 0752) Weight:  [102.1 kg]  102.1 kg (07/27 0115)   Vent Mode: CPAP;PSV FiO2 (%):  [40 %] 40 % Set Rate:  [16 bmp] 16 bmp Vt Set:  [380 mL] 380 mL PEEP:  [5 cmH20] 5 cmH20 Pressure Support:  [12 cmH20] 12 cmH20 Plateau Pressure:  [15 cmH20-17 cmH20] 15 cmH20   Intake/Output Summary (Last 24 hours) at 08/14/2018 0827 Last data filed at 08/14/2018 0600 Gross per 24 hour  Intake 1980.45 ml  Output 950 ml  Net 1030.45 ml   Examination: General: lying in bed, awake, sitting comfortably  HENT: PERRLA, EOMI Lungs: CTAB Cardiovascular: bradycardic with 1+ systolic ejection murmur Abdomen: soft, NTND Extremities: warm and well perfused. No BLEE Neuro: alert, responding to questions. Nodding to treatment plan.  Resolved Hospital Problem list   Septic Shock   Assessment & Plan:  Active Problems:   Respiratory failure (HCC)   Acute on chronic hypoxic, hypercapnic respiratory failure. Hx of asthma. CXR this AM stable. CPAP;PSV this AM.  -- trial extubation today  -- likely has underlying hypoventilation exacerbated by acute illness -- prn BDs  Acute metabolic encephalopathy with myoclonic jerks. Hx of Schizophrenia and depression. Neurology consulted: EEG is suggestive of diffuse encephalopathy. No serizures or epilieptiform discahrges appreciated. Most like toxic-metabolic and infectious cause. ddx includes HIE, PRES, CNS infection, septic emboli.  -- PRN fentanyl and midzaolam 2q2.  -- neurology consulted -- RASS goal 0   MRSA bacteremia. TTE without vegetation.  --holding vancomycin for high blood levels per pharmacy  -- plans  to switch to daptomycin per ID -- f/u repeat blood cultures (08/12/18) - NGTD  Hx of HTN. Normotensive with BP ranging 103-112/53-61  -- monitor hemodynamics  AKI from ATN in setting of CKD (IV?) Creatinine in 2015 was 1.8, in 2018 was 2.07. No faxed documents. CKD previously documented CKD in care everywhere.  -- continue IV fluids, 50 LR -- f/u BMET   Thrombocytopenia in setting of sepsis. stable -- f/u CBC  Hx of Lt leg DVT AM cbc not yet resulted. DVT in 2018 and patient has been on eliquis. Per guidelines, patient only needed short period of anticoagulation for hx of single DVT. Can consider/recommend f/u for discontinuing eliquis at discharge. -- f/u 7/27 AM plt  -- hold outpt eliquis -- continue heparin gtt per pharmacy while closely watching PLT level -- continue dvt rx?   Best practice:  Diet: Tube feeds Pain/Anxiety/Delirium protocol (if indicated): acetaminophen, fentanyl 50-200, midazolam 2 q 2 DVT prophylaxis: Heparin drip GI prophylaxis: Protonix Glucose control: CBG q4 hours Mobility: Bedrest Code Status: Full code Family Communication:  Primary Emergency Contact: [place], Home Phone: [phone]  Disposition: ICU  Labs   CBC: Recent Labs  Lab 08/12/18 1609 08/13/18 0347  WBC  --  7.2  HGB 16.3* 13.2  HCT 48.0* 42.1  MCV  --  90.3  PLT  --  94*    Basic Metabolic Panel: Recent Labs  Lab 08/12/18 1609 08/12/18 1643 08/13/18 0347 08/13/18 1725 08/14/18 0400  NA 138  --  138  --  140  K 4.1  --  3.9  --  3.7  CL  --   --  92*  --  96*  CO2  --   --  36*  --  35*  GLUCOSE  --   --  137*  --  128*  BUN  --   --  38*  --  50*  CREATININE  --   --  3.16*  --  3.20*  CALCIUM  --   --  9.3  --  8.9  MG  --  2.1 2.1 2.2 2.2  PHOS  --  1.9* 2.6 2.1* 1.9*   GFR: Estimated Creatinine Clearance: 20.5 mL/min (A) (by C-G formula based on SCr of 3.2 mg/dL (H)).  Liver Function Tests: Recent Labs  Lab 08/13/18 0347 08/14/18 0400  AST 111* 49*  ALT 42 29  ALKPHOS 110 88  BILITOT 2.3* 1.4*  PROT 6.3* 6.0*  ALBUMIN 3.1* 2.8*   ABG    Component Value Date/Time   PHART 7.475 (H) 08/12/2018 1609   PCO2ART 60.5 (H) 08/12/2018 1609   PO2ART 92.0 08/12/2018 1609   HCO3 44.5 (H) 08/12/2018 1609   TCO2 46 (H) 08/12/2018 1609   O2SAT 97.0 08/12/2018 1609    CBG: Recent Labs  Lab 08/13/18  1620 08/13/18 1944 08/13/18 2351 08/14/18 0355 08/14/18 0732  GLUCAP 115* 122* 123* 127* 116*    Home Medications  Prior to Admission medications   Medication Sig Start Date End Date Taking? Authorizing Provider  apixaban (ELIQUIS) 2.5 MG TABS tablet Take 2.5 mg by mouth 2 (two) times daily.   Yes [provider]  furosemide (LASIX) 40 MG tablet Take 40 mg by mouth daily.   Yes [provider]    Wilber Oliphant, M.D.  PGY-2  Family Medicine  [phone] 08/14/2018 8:37 AM

## 2018-08-14 NOTE — Progress Notes (Signed)
PHARMACY - PHYSICIAN COMMUNICATION CRITICAL VALUE ALERT - BLOOD CULTURE IDENTIFICATION (BCID)  Results for orders placed or performed during the hospital encounter of 08/12/18  Blood Culture ID Panel (Reflexed) (Collected: 08/12/2018  4:43 PM)  Result Value Ref Range   Enterococcus species NOT DETECTED NOT DETECTED   Listeria monocytogenes NOT DETECTED NOT DETECTED   Staphylococcus species DETECTED (A) NOT DETECTED   Staphylococcus aureus (BCID) NOT DETECTED NOT DETECTED   Methicillin resistance NOT DETECTED NOT DETECTED   Streptococcus species NOT DETECTED NOT DETECTED   Streptococcus agalactiae NOT DETECTED NOT DETECTED   Streptococcus pneumoniae NOT DETECTED NOT DETECTED   Streptococcus pyogenes NOT DETECTED NOT DETECTED   Acinetobacter baumannii NOT DETECTED NOT DETECTED   Enterobacteriaceae species NOT DETECTED NOT DETECTED   Enterobacter cloacae complex NOT DETECTED NOT DETECTED   Escherichia coli NOT DETECTED NOT DETECTED   Klebsiella oxytoca NOT DETECTED NOT DETECTED   Klebsiella pneumoniae NOT DETECTED NOT DETECTED   Proteus species NOT DETECTED NOT DETECTED   Serratia marcescens NOT DETECTED NOT DETECTED   Haemophilus influenzae NOT DETECTED NOT DETECTED   Neisseria meningitidis NOT DETECTED NOT DETECTED   Pseudomonas aeruginosa NOT DETECTED NOT DETECTED   Candida albicans NOT DETECTED NOT DETECTED   Candida glabrata NOT DETECTED NOT DETECTED   Candida krusei NOT DETECTED NOT DETECTED   Candida parapsilosis NOT DETECTED NOT DETECTED   Candida tropicalis NOT DETECTED NOT DETECTED    Changes to prescribed antibiotics required: None. ID following. Pt on appropriate therapy for MRSA bacteremia verified by blood cultures at [facility] and MRSA confirmed. All repeat blood cultures have remained negative since 7/22.   Kennon Holter, PharmD PGY1 Ambulatory Care Pharmacy Resident [place] Phone: [phone] 08/14/2018  7:13 PM

## 2018-08-14 NOTE — Progress Notes (Signed)
[place] for Infectious Disease   Reason for visit: Follow up on   Interval History: now extubated, remains afebrile, normal WBC. No acute events.  No complaints.   Physical Exam: Constitutional:  Vitals:   08/14/18 1106 08/14/18 1115  BP:    Pulse: 66 65  Resp: (!) 23 (!) 21  Temp: 98.4 F (36.9 C)   SpO2: 95% 95%   patient appears in NAD Eyes: anicteric HENT:+ [place] Respiratory: Normal respiratory effort; CTA B Cardiovascular: RRR GI: soft, nt, nd  Review of Systems: Constitutional: negative for fevers and chills Respiratory: negative for cough Gastrointestinal: negative for nausea and diarrhea Integument/breast: negative for rash  Lab Results  Component Value Date   WBC 7.7 08/14/2018   HGB 12.0 08/14/2018   HCT 38.9 08/14/2018   MCV 91.7 08/14/2018   PLT 76 (L) 08/14/2018    Lab Results  Component Value Date   CREATININE 3.20 (H) 08/14/2018   BUN 50 (H) 08/14/2018   NA 140 08/14/2018   K 3.7 08/14/2018   CL 96 (L) 08/14/2018   CO2 35 (H) 08/14/2018    Lab Results  Component Value Date   ALT 29 08/14/2018   AST 49 (H) 08/14/2018   ALKPHOS 88 08/14/2018     Microbiology: Recent Results (from the past 240 hour(s))  MRSA PCR Screening     Status: Abnormal   Collection Time: 08/12/18  3:31 PM   Specimen: Nasopharyngeal  Result Value Ref Range Status   MRSA by PCR POSITIVE (A) NEGATIVE Final    Comment:        The GeneXpert MRSA Assay (FDA approved for NASAL specimens only), is one component of a comprehensive MRSA colonization surveillance program. It is not intended to diagnose MRSA infection nor to guide or monitor treatment for MRSA infections. RESULT CALLED TO, READ BACK BY AND VERIFIED WITH: J.BEABRAUT RN AT 5883 08/12/2018 BY A.DAVIS Performed at [facility], [place] [address], [place], [place] [address]   Culture, blood (Routine X 2) w Reflex to ID Panel     Status: None (Preliminary result)   Collection Time: 08/12/18  4:30  PM   Specimen: BLOOD LEFT ARM  Result Value Ref Range Status   Specimen Description BLOOD LEFT ARM  Final   Special Requests   Final    BOTTLES DRAWN AEROBIC ONLY Blood Culture results may not be optimal due to an inadequate volume of blood received in culture bottles   Culture   Final    NO GROWTH 2 DAYS Performed at [facility], [place] [address], [place], [place] [address]    Report Status PENDING  Incomplete  Culture, blood (Routine X 2) w Reflex to ID Panel     Status: None (Preliminary result)   Collection Time: 08/12/18  4:43 PM   Specimen: BLOOD LEFT HAND  Result Value Ref Range Status   Specimen Description BLOOD LEFT HAND  Final   Special Requests   Final    BOTTLES DRAWN AEROBIC ONLY Blood Culture results may not be optimal due to an inadequate volume of blood received in culture bottles   Culture   Final    NO GROWTH 2 DAYS Performed at [facility], [place] [address] [place] [address], [place], [place] [address]    Report Status PENDING  Incomplete  Culture, respiratory (non-expectorated)     Status: None (Preliminary result)   Collection Time: 08/12/18 11:47 PM   Specimen: Endotracheal; Respiratory  Result Value Ref  Range Status   Specimen Description ENDOTRACHEAL  Final   Special Requests NONE  Final   Gram Stain   Final    ABUNDANT WBC PRESENT,BOTH PMN AND MONONUCLEAR FEW GRAM POSITIVE COCCI RARE GRAM NEGATIVE RODS    Culture   Final    CULTURE REINCUBATED FOR BETTER GROWTH Performed at [facility], [place] [address], [place], [place] [address]    Report Status PENDING  Incomplete    Impression/Plan:  1. MRSA bacteremia - verified blood cultures at [facility] and MRSA confirmed.  Repeat blood cultures from 7/22 have remained negative as well.  Cultures here remain negative. Will need a TEE when able  2.  Medication monitoring - vancomycin level still up, 27 this am.  Starting daptomycin tomorrow.  Will need a baseline CK  3.  Respiratory failure - now extuabted,  improved.  No evidence of pulmonary infection.

## 2018-08-14 NOTE — Progress Notes (Signed)
ANTICOAGULATION CONSULT NOTE - Initial Consult  Pharmacy Consult for heparin due to history of DVT Indication: H/O DVT on Apixaban PTA   Patient Measurements: Height: '5\' 1"'$  (154.9 cm) Weight: 225 lb 1.4 oz (102.1 kg) IBW/kg (Calculated) : 47.8 Heparin Dosing Weight: 71.9 kg  Vital Signs: Temp: 98 F (36.7 C) (07/27 0739) Temp Source: Axillary (07/27 0739) BP: 110/51 (07/27 0900) Pulse Rate: 46 (07/27 0900)  Labs: Recent Labs    08/12/18 1609 08/12/18 1815  08/13/18 0347 08/13/18 1500 08/13/18 2200 08/14/18 0400 08/14/18 0635 08/14/18 0637  HGB 16.3*  --   --  13.2  --   --   --   --   --   HCT 48.0*  --   --  42.1  --   --   --   --   --   PLT  --   --   --  94*  --   --   --   --   --   APTT  --  31   < >  --  138* 122*  --   --  93*  HEPARINUNFRC  --  >2.20*  --   --   --   --   --  1.96*  --   CREATININE  --   --   --  3.16*  --   --  3.20*  --   --    < > = values in this interval not displayed.     Medical History: Past Medical History:  Diagnosis Date  . Asthma   . CKD (chronic kidney disease)   . Depression   . DVT (deep venous thrombosis) ([place])   . GERD (gastroesophageal reflux disease)   . Hypertension   . Hypothyroidism   . Osteoarthritis   . Schizophrenia ([place])   . Seizures (HCC)     Medications:  Scheduled:  . chlorhexidine gluconate (MEDLINE KIT)  15 mL Mouth Rinse BID  . Chlorhexidine Gluconate Cloth  6 each Topical Daily  . feeding supplement (PRO-STAT SUGAR FREE 64)  30 mL Per Tube BID  . feeding supplement (VITAL HIGH PROTEIN)  1,000 mL Per Tube Q24H  . mouth rinse  15 mL Mouth Rinse 10 times per day  . pantoprazole sodium  40 mg Per Tube Q24H     Assessment: Kelsey Leonard is a 63 y.o. female with PMH of HTN, asthma, LLE DVT 05/2017, CKD 4, who was transferred to [facility] from [facility] on 7/25. She initially presented to [place] on 7/20 for lip swelling. She was found to have hypoxia (SpO2 50%) with several weeks of dyspnea, leg  swelling, HTN emergency, and CHF. On 7/25 she was found to have decreased AMS and myoclonic jerks. She was intubated and started on pressors CCM management at [facility]. Pharmacy consulted to dose heparin due to history of DVT. Pt was taking Apixaban '5mg'$  BID PTA. Last dose of apixaban given on 07/25 at 0900 during stay at [facility].   APTT is therapeutic. plts down slightly to 94, hgb stable  Goal of Therapy:  Heparin level 0.3-0.7 units/ml aPTT 66-102 seconds Monitor platelets by anticoagulation protocol: Yes   Plan:  -Continue heparin at 800 units/hr -Daily HL, CBC, aPTT   Harvel Quale 08/14/2018 10:11 AM

## 2018-08-15 DIAGNOSIS — N189 Chronic kidney disease, unspecified: Secondary | ICD-10-CM

## 2018-08-15 DIAGNOSIS — Z95828 Presence of other vascular implants and grafts: Secondary | ICD-10-CM

## 2018-08-15 LAB — CULTURE, RESPIRATORY W GRAM STAIN: Culture: NORMAL

## 2018-08-15 LAB — COMPREHENSIVE METABOLIC PANEL
ALT: 23 U/L (ref 0–44)
AST: 36 U/L (ref 15–41)
Albumin: 2.9 g/dL — ABNORMAL LOW (ref 3.5–5.0)
Alkaline Phosphatase: 76 U/L (ref 38–126)
Anion gap: 11 (ref 5–15)
BUN: 50 mg/dL — ABNORMAL HIGH (ref 6–20)
CO2: 34 mmol/L — ABNORMAL HIGH (ref 22–32)
Calcium: 9.1 mg/dL (ref 8.9–10.3)
Chloride: 96 mmol/L — ABNORMAL LOW (ref 98–111)
Creatinine, Ser: 3.08 mg/dL — ABNORMAL HIGH (ref 0.44–1.00)
GFR calc Af Amer: 18 mL/min — ABNORMAL LOW (ref >60)
GFR calc non Af Amer: 16 mL/min — ABNORMAL LOW (ref >60)
Glucose, Bld: 104 mg/dL — ABNORMAL HIGH (ref 70–99)
Potassium: 3.6 mmol/L (ref 3.5–5.1)
Sodium: 141 mmol/L (ref 135–145)
Total Bilirubin: 1 mg/dL (ref 0.3–1.2)
Total Protein: 6.2 g/dL — ABNORMAL LOW (ref 6.5–8.1)

## 2018-08-15 LAB — GLUCOSE, CAPILLARY
Glucose-Capillary: 101 mg/dL — ABNORMAL HIGH (ref 70–99)
Glucose-Capillary: 103 mg/dL — ABNORMAL HIGH (ref 70–99)
Glucose-Capillary: 108 mg/dL — ABNORMAL HIGH (ref 70–99)
Glucose-Capillary: 125 mg/dL — ABNORMAL HIGH (ref 70–99)
Glucose-Capillary: 188 mg/dL — ABNORMAL HIGH (ref 70–99)
Glucose-Capillary: 92 mg/dL (ref 70–99)
Glucose-Capillary: 99 mg/dL (ref 70–99)

## 2018-08-15 LAB — CK: Total CK: 74 U/L (ref 38–234)

## 2018-08-15 LAB — CBC
HCT: 40.1 % (ref 36.0–46.0)
Hemoglobin: 11.8 g/dL — ABNORMAL LOW (ref 12.0–15.0)
MCH: 28 pg (ref 26.0–34.0)
MCHC: 29.4 g/dL — ABNORMAL LOW (ref 30.0–36.0)
MCV: 95.2 fL (ref 80.0–100.0)
Platelets: 75 10*3/uL — ABNORMAL LOW (ref 150–400)
RBC: 4.21 MIL/uL (ref 3.87–5.11)
RDW: 17.9 % — ABNORMAL HIGH (ref 11.5–15.5)
WBC: 5.5 10*3/uL (ref 4.0–10.5)
nRBC: 0 % (ref 0.0–0.2)

## 2018-08-15 LAB — APTT: aPTT: 81 seconds — ABNORMAL HIGH (ref 24–36)

## 2018-08-15 MED ORDER — ATROPINE SULFATE 1 MG/10ML IJ SOSY
PREFILLED_SYRINGE | INTRAMUSCULAR | Status: AC
  Filled 2018-08-15: qty 10

## 2018-08-15 MED ORDER — APIXABAN 5 MG PO TABS
5.0000 mg | ORAL_TABLET | Freq: Two times a day (BID) | ORAL | Status: DC
  Administered 2018-08-15 – 2018-08-24 (×19): 5 mg via ORAL
  Filled 2018-08-15 (×19): qty 1

## 2018-08-15 MED ORDER — ORAL CARE MOUTH RINSE
15.0000 mL | Freq: Two times a day (BID) | OROMUCOSAL | Status: DC
  Administered 2018-08-15 – 2018-08-21 (×13): 15 mL via OROMUCOSAL

## 2018-08-15 NOTE — Evaluation (Signed)
Physical Therapy Evaluation Patient Details Name: Kelsey Leonard MRN: [id number] DOB: 1955/09/11 Today's Date: 08/15/2018   History of Present Illness  Pt adm from [place] with acute on chronic respiratory failure and MRSA bacteremia. Intubated 7/25-7/27. PMH - presumed Sarcoidosis (never biopsied), HTN, Asthma, Lt leg DVT May 2018, CKD 4, Schizophrenia, Depression, Hypothyroidism  Clinical Impression  Pt admitted with above diagnosis and presents to PT with functional limitations due to deficits listed below (See PT problem list). Pt needs skilled PT to maximize independence and safety to allow discharge to home. Expect pt will make steady progress and be able to mobilize in her home environment at [place].      Follow Up Recommendations Home health PT;Supervision - Intermittent    Equipment Recommendations  Rolling walker with 5" wheels    Recommendations for Other Services       Precautions / Restrictions Precautions Precautions: Fall      Mobility  Bed Mobility Overal bed mobility: Needs Assistance Bed Mobility: Supine to Sit     Supine to sit: Min guard     General bed mobility comments: Assist for lines/safety  Transfers Overall transfer level: Needs assistance Equipment used: 1 person hand held assist Transfers: Sit to/from [facility] Sit to Stand: Min assist Stand pivot transfers: Min assist       General transfer comment: Assist for balance and support. Bed to bsc to recliner with stand pivot  Ambulation/Gait                Stairs            Wheelchair Mobility    Modified Rankin (Stroke Patients Only)       Balance Overall balance assessment: Needs assistance Sitting-balance support: No upper extremity supported;Feet supported Sitting balance-Leahy Scale: Good     Standing balance support: Single extremity supported Standing balance-Leahy Scale: Poor Standing balance comment: UE support and min assist for static  standing                             Pertinent Vitals/Pain Pain Assessment: No/denies pain    Home Living Family/patient expects to be discharged to:: Private residence Living Arrangements: Parent(mother and father) Available Help at Discharge: Family;Available PRN/intermittently Type of Home: House Home Access: Stairs to enter Entrance Stairs-Rails: Right Entrance Stairs-Number of Steps: 3-4 Home Layout: One level Home Equipment: None Additional Comments: Pt's mother takes care of pts father    Prior Function Level of Independence: Needs assistance   Gait / Transfers Assistance Needed: Amb independently without assistive device           Hand Dominance   Dominant Hand: Right    Extremity/Trunk Assessment   Upper Extremity Assessment Upper Extremity Assessment: Defer to OT evaluation    Lower Extremity Assessment Lower Extremity Assessment: Generalized weakness       Communication   Communication: No difficulties  Cognition Arousal/Alertness: Awake/alert Behavior During Therapy: WFL for tasks assessed/performed Overall Cognitive Status: Within Functional Limits for tasks assessed                                 General Comments: May have some slight delays at baseline      General Comments General comments (skin integrity, edema, etc.): Pt on 3L of O2 at rest. When removed O2 SpO2 decr to 85%.    Exercises  Assessment/Plan    PT Assessment Patient needs continued PT services  PT Problem List Decreased strength;Decreased activity tolerance;Decreased balance;Decreased mobility       PT Treatment Interventions DME instruction;Gait training;Functional mobility training;Stair training;Therapeutic activities;Therapeutic exercise;Balance training;Patient/family education    PT Goals (Current goals can be found in the Care Plan section)  Acute Rehab PT Goals Patient Stated Goal: return home PT Goal Formulation: With  patient Time For Goal Achievement: 08/29/18 Potential to Achieve Goals: Good    Frequency Min 3X/week   Barriers to discharge Inaccessible home environment;Decreased caregiver support stairs to enter home and elderly parents    Co-evaluation               AM-PAC PT "6 Clicks" Mobility  Outcome Measure Help needed turning from your back to your side while in a flat bed without using bedrails?: None Help needed moving from lying on your back to sitting on the side of a flat bed without using bedrails?: A Little Help needed moving to and from a bed to a chair (including a wheelchair)?: A Little Help needed standing up from a chair using your arms (e.g., wheelchair or bedside chair)?: A Little Help needed to walk in hospital room?: A Little Help needed climbing 3-5 steps with a railing? : A Lot 6 Click Score: 18    End of Session Equipment Utilized During Treatment: Gait belt;Oxygen Activity Tolerance: Patient tolerated treatment well Patient left: in chair;with call bell/phone within reach Nurse Communication: Mobility status PT Visit Diagnosis: Unsteadiness on feet (R26.81);Muscle weakness (generalized) (M62.81)    Time: 4888-9169 PT Time Calculation (min) (ACUTE ONLY): 18 min   Charges:   PT Evaluation $PT Eval Moderate Complexity: [place] Pager [phone] Office [place] 08/15/2018, 10:41 AM

## 2018-08-15 NOTE — Progress Notes (Signed)
[place] Progress Note Patient Name: Kelsey Leonard DOB: [birth year] MRN: [id number]   Date of Service  08/15/2018  HPI/Events of Note  Difficult vascular access for lab draw  eICU Interventions  Pt may have blood for AM labs drawn x 1 from hand on the side of fistula        [place] U Ogan 08/15/2018, 1:22 AM

## 2018-08-15 NOTE — Progress Notes (Signed)
NAME:  Kelsey Leonard, MRN:  [id number], DOB:  March 18, 1955, LOS: 3 ADMISSION DATE:  08/12/2018, CONSULTATION DATE:  08/12/18 REFERRING MD:  Dr. Roger Shelter, [facility], CHIEF COMPLAINT:  AMS   Brief History   63 yo female presented to [facility] 7/20 with lip swelling.  Found to have hypoxia (SpO2 50%) with several weeks of dyspnea, leg swelling and weight gain with blood pressure 201/95.  COVID testing negative.  Treated for HTN emergency and CHF.  Found to have Staph aureus bacteremia and started on antibiotics.  She was noted to have worsening mental status with myoclonic jerks and hypoxia/hypercapnia on 7/25.  She was intubated, started on pressors and transferred to [facility] Improved course in the ICU, extubated on 7/27, and transferred to floor for remainder of her care.   Past Medical History  Presumed Sarcoidosis (never biopsied), HTN, Asthma, Lt leg DVT May 2018, CKD 4, Schizophrenia, Depression, Hypothyroidism  Significant Hospital Events   7/25 Transfer from [facility] 7/26 off pressors  Consults:  Neurology 7/25 AMS   Procedures:  ETT 7/25 >> Rt [place] CVL 7/25 >>   Significant Diagnostic Tests:  Echo 08/08/18 >> mild LVH, EF 55 to 60%, septal flattening in systole, mod RV enlargement CT head 7/25 >> EEG 7/25 >> moderate diffuse encephalopathy.  No seizure activity. MRI brain 7/26 >> T2 hyperintensity in b/l GB, hyperintensity in pons likely from chronic microvascular ischemia versus PRES  Micro Data:  MRSA 7/25 positive  Blood 7/20 [name]) >> MRSA COVID 7/20 [name]) >> Negative Blood 7/25 >> COAAG - staph, aerobic bottle only.  Sputum 7/25 >> few gram + cocci, rare GNR. Consistent with normal respiratory flora. FINAL   Antimicrobials:  Vancomycin 7/20 >> 7/26 Daptomycin 7/29 >>    Interim history/subjective:  Extubated successfully yesterday. Has continued to do well.   Objective   Temp:  [97.8 F (36.6 C)-99 F (37.2 C)] 98.1 F (36.7 C) (07/28 0735)  Pulse Rate:  [45-67] 59 (07/28 0800) Resp:  [15-27] 16 (07/28 0800) BP: (73-131)/(44-87) 131/76 (07/28 0800) SpO2:  [94 %-100 %] 95 % (07/28 0800) Weight:  [101.7 kg] 101.7 kg (07/28 0420)    Intake/Output Summary (Last 24 hours) at 08/15/2018 0828 Last data filed at 08/15/2018 0800 Gross per 24 hour  Intake 1405.99 ml  Output 130 ml  Net 1275.99 ml   Examination: General: lying in bed, awake, sitting comfortably, eating breakfast  HENT: PERRLA, EOMI Lungs: CTAB Cardiovascular: bradycardic with 1+ systolic ejection murmur Abdomen: soft, NTND Extremities: warm and well perfused. No BLEE Neuro: alert, responding to questions  Resolved Hospital Problem list   Septic Shock   Assessment & Plan:  Active Problems:   Respiratory failure (HCC)   CKD (chronic kidney disease), stage IV (HCC)   Asthma   Acute on chronic hypoxic, hypercapnic respiratory failure. Hx of asthma. Successfully extubated yesterday  -- PRN BDs -- d/c PTX  -- d/c to [facility] for 5/80  Acute metabolic encephalopathy with myoclonic jerks, resolved Hx of Schizophrenia and depression. Neurology consulted: EEG is suggestive of diffuse encephalopathy. No serizures or epilieptiform discahrges appreciated. Most like toxic-metabolic and infectious cause. Patient's mental status has improved. Unsure of baseline of patient. Attempted to call family, but no answer despite several tries. Will attempt to reach out.  -- PT/OT    MRSA bacteremia. TTE without vegetation. Previously on vanc, but high serum levels, was held  -- starting datpomycin on 7/29 per pharamcy  -- f/u repeat blood cultures (08/12/18) - NGTD  Hx of HTN. Normotensive with BP ranging from 73-131/44-87. HR 45-67  Bradycardia  HR of 45-67. Not on any BB. Seems to be patient's baseline. 1st degree AV block on EKG this am.  -- monitor hemodynamics  AKI from [facility] in setting of CKD (IV?) Creatinine in 2015 was 1.8, in 2018 was 2.07. No faxed documents.  CKD previously documented CKD in care everywhere.  -- continue IV fluids, 50 LR -- f/u BMET  Thrombocytopenia in setting of sepsis. Stable. 75 today, from 58 yesterday. No bleeding.  -- f/u CBC  Hx of Lt leg DVT DVT in 2018 and prior DVT's per chart. Patient has been on eliquis,.  -- switch to home eliquis  Best practice:  Diet: PO Pain/Anxiety/Delirium protocol (if indicated): acetaminophen DVT prophylaxis: eliquis GI prophylaxis: Protonix Glucose control: CBG q4 hours Mobility: Bedrest Code Status: Full code Family Communication:  Primary Emergency Contact: [place], Home Phone: [phone]  Disposition: floor  Labs   CBC: Recent Labs  Lab 08/12/18 1609 08/13/18 0347 08/14/18 1051 08/15/18 0352  WBC  --  7.2 7.7 5.5  HGB 16.3* 13.2 12.0 11.8*  HCT 48.0* 42.1 38.9 40.1  MCV  --  90.3 91.7 95.2  PLT  --  94* 76* 75*    Basic Metabolic Panel: Recent Labs  Lab 08/12/18 1609 08/12/18 1643 08/13/18 0347 08/13/18 1725 08/14/18 0400 08/15/18 0352  NA 138  --  138  --  140 141  K 4.1  --  3.9  --  3.7 3.6  CL  --   --  92*  --  96* 96*  CO2  --   --  36*  --  35* 34*  GLUCOSE  --   --  137*  --  128* 104*  BUN  --   --  38*  --  50* 50*  CREATININE  --   --  3.16*  --  3.20* 3.08*  CALCIUM  --   --  9.3  --  8.9 9.1  MG  --  2.1 2.1 2.2 2.2  --   PHOS  --  1.9* 2.6 2.1* 1.9*  --    GFR: Estimated Creatinine Clearance: 21.3 mL/min (A) (by C-G formula based on SCr of 3.08 mg/dL (H)).  Liver Function Tests: Recent Labs  Lab 08/13/18 0347 08/14/18 0400 08/15/18 0352  AST 111* 49* 36  ALT 42 29 23  ALKPHOS 110 88 76  BILITOT 2.3* 1.4* 1.0  PROT 6.3* 6.0* 6.2*  ALBUMIN 3.1* 2.8* 2.9*   ABG    Component Value Date/Time   PHART 7.475 (H) 08/12/2018 1609   PCO2ART 60.5 (H) 08/12/2018 1609   PO2ART 92.0 08/12/2018 1609   HCO3 44.5 (H) 08/12/2018 1609   TCO2 46 (H) 08/12/2018 1609   O2SAT 97.0 08/12/2018 1609    CBG: Recent Labs  Lab 08/14/18  1542 08/14/18 1953 08/15/18 0015 08/15/18 0439 08/15/18 0734  GLUCAP 103* 115* 99 101* 103*    Home Medications  Prior to Admission medications   Medication Sig Start Date End Date Taking? Authorizing Provider  apixaban (ELIQUIS) 2.5 MG TABS tablet Take 2.5 mg by mouth 2 (two) times daily.   Yes [provider]  furosemide (LASIX) 40 MG tablet Take 40 mg by mouth daily.   Yes [provider]    Wilber Oliphant, M.D.  PGY-2  Family Medicine  [phone] 08/15/2018 8:28 AM

## 2018-08-15 NOTE — Progress Notes (Signed)
[place] for Infectious Disease   Reason for visit: Follow up on   Interval History: remains extubated, remains afebrile, normal WBC. No acute events.  No complaints. 1 blood culture bottle with CoNS  Physical Exam: Constitutional:  Vitals:   08/15/18 0800 08/15/18 0900  BP: 131/76   Pulse: (!) 59 66  Resp: 16 (!) 21  Temp:    SpO2: 95% 97%   patient appears in NAD Eyes: anicteric HENT:+ [place] Respiratory: Normal respiratory effort; CTA B Cardiovascular: RRR GI: soft, nt, nd Lines: right subclavian triple lumen, c/d, no erythema  Review of Systems: Constitutional: negative for fevers and chills Respiratory: negative for cough Gastrointestinal: negative for nausea and diarrhea Integument/breast: negative for rash  Lab Results  Component Value Date   WBC 5.5 08/15/2018   HGB 11.8 (L) 08/15/2018   HCT 40.1 08/15/2018   MCV 95.2 08/15/2018   PLT 75 (L) 08/15/2018    Lab Results  Component Value Date   CREATININE 3.08 (H) 08/15/2018   BUN 50 (H) 08/15/2018   NA 141 08/15/2018   K 3.6 08/15/2018   CL 96 (L) 08/15/2018   CO2 34 (H) 08/15/2018    Lab Results  Component Value Date   ALT 23 08/15/2018   AST 36 08/15/2018   ALKPHOS 76 08/15/2018     Microbiology: Recent Results (from the past 240 hour(s))  MRSA PCR Screening     Status: Abnormal   Collection Time: 08/12/18  3:31 PM   Specimen: Nasopharyngeal  Result Value Ref Range Status   MRSA by PCR POSITIVE (A) NEGATIVE Final    Comment:        The GeneXpert MRSA Assay (FDA approved for NASAL specimens only), is one component of a comprehensive MRSA colonization surveillance program. It is not intended to diagnose MRSA infection nor to guide or monitor treatment for MRSA infections. RESULT CALLED TO, READ BACK BY AND VERIFIED WITH: J.BEABRAUT RN AT 1638 08/12/2018 BY A.DAVIS Performed at [facility], [place] [address], [place], [place] [address]   Culture, blood (Routine X 2) w Reflex to  ID Panel     Status: None (Preliminary result)   Collection Time: 08/12/18  4:30 PM   Specimen: BLOOD LEFT ARM  Result Value Ref Range Status   Specimen Description BLOOD LEFT ARM  Final   Special Requests   Final    BOTTLES DRAWN AEROBIC ONLY Blood Culture results may not be optimal due to an inadequate volume of blood received in culture bottles   Culture   Final    NO GROWTH 3 DAYS Performed at [facility], [place] [address], [place], [place] [address]    Report Status PENDING  Incomplete  Culture, blood (Routine X 2) w Reflex to ID Panel     Status: None (Preliminary result)   Collection Time: 08/12/18  4:43 PM   Specimen: BLOOD LEFT HAND  Result Value Ref Range Status   Specimen Description BLOOD LEFT HAND  Final   Special Requests   Final    BOTTLES DRAWN AEROBIC ONLY Blood Culture results may not be optimal due to an inadequate volume of blood received in culture bottles   Culture  Setup Time   Final    AEROBIC BOTTLE ONLY GRAM POSITIVE COCCI CRITICAL RESULT CALLED TO, READ BACK BY AND VERIFIED WITHTillman Sers [facility] 1779 08/14/18 A BROWNING Performed at [facility], [place] [address], [place], [place] [address]    Culture  GRAM POSITIVE COCCI  Final   Report Status PENDING  Incomplete  Blood Culture ID Panel (Reflexed)     Status: Abnormal   Collection Time: 08/12/18  4:43 PM  Result Value Ref Range Status   Enterococcus species NOT DETECTED NOT DETECTED Final   Listeria monocytogenes NOT DETECTED NOT DETECTED Final   Staphylococcus species DETECTED (A) NOT DETECTED Final    Comment: Methicillin (oxacillin) susceptible coagulase negative staphylococcus. Possible blood culture contaminant (unless isolated from more than one blood culture draw or clinical case suggests pathogenicity). No antibiotic treatment is indicated for blood  culture contaminants. CRITICAL RESULT CALLED TO, READ BACK BY AND VERIFIED WITH: Tillman Sers PHARMD 1610 08/14/18 A BROWNING    Staphylococcus  aureus (BCID) NOT DETECTED NOT DETECTED Final   Methicillin resistance NOT DETECTED NOT DETECTED Final   Streptococcus species NOT DETECTED NOT DETECTED Final   Streptococcus agalactiae NOT DETECTED NOT DETECTED Final   Streptococcus pneumoniae NOT DETECTED NOT DETECTED Final   Streptococcus pyogenes NOT DETECTED NOT DETECTED Final   Acinetobacter baumannii NOT DETECTED NOT DETECTED Final   Enterobacteriaceae species NOT DETECTED NOT DETECTED Final   Enterobacter cloacae complex NOT DETECTED NOT DETECTED Final   Escherichia coli NOT DETECTED NOT DETECTED Final   Klebsiella oxytoca NOT DETECTED NOT DETECTED Final   Klebsiella pneumoniae NOT DETECTED NOT DETECTED Final   Proteus species NOT DETECTED NOT DETECTED Final   Serratia marcescens NOT DETECTED NOT DETECTED Final   Haemophilus influenzae NOT DETECTED NOT DETECTED Final   Neisseria meningitidis NOT DETECTED NOT DETECTED Final   Pseudomonas aeruginosa NOT DETECTED NOT DETECTED Final   Candida albicans NOT DETECTED NOT DETECTED Final   Candida glabrata NOT DETECTED NOT DETECTED Final   Candida krusei NOT DETECTED NOT DETECTED Final   Candida parapsilosis NOT DETECTED NOT DETECTED Final   Candida tropicalis NOT DETECTED NOT DETECTED Final    Comment: Performed at [facility], [place]. [address] [place], [place] [address]  Culture, respiratory (non-expectorated)     Status: None   Collection Time: 08/12/18 11:47 PM   Specimen: Endotracheal; Respiratory  Result Value Ref Range Status   Specimen Description ENDOTRACHEAL  Final   Special Requests NONE  Final   Gram Stain   Final    ABUNDANT WBC PRESENT,BOTH PMN AND MONONUCLEAR FEW GRAM POSITIVE COCCI RARE GRAM NEGATIVE RODS    Culture   Final    Consistent with normal respiratory flora. Performed at [facility], [place] [address] [place], [place] [address]    Report Status 08/15/2018 FINAL  Final    Impression/Plan:  1. MRSA bacteremia - repeat cultures here remain  ngtd except for likely contaminate.  Will need a TEE when able Started daptomycin today  2.  Medication monitoring - CK wnl  3.  Chronic renal insufficiency - previous creat about 2 in 2018.  Not sure what her current baseline is but has remained around 3 now.

## 2018-08-15 NOTE — Progress Notes (Addendum)
[place] for heparin due to history of DVT Indication: H/O DVT on Apixaban PTA   Patient Measurements: Height: 5\' 1"  (154.9 cm) Weight: 224 lb 3.3 oz (101.7 kg) IBW/kg (Calculated) : 47.8 Heparin Dosing Weight: 71.9 kg  Vital Signs: Temp: 98.1 F (36.7 C) (07/28 0735) Temp Source: Oral (07/28 0735) BP: 131/76 (07/28 0800) Pulse Rate: 59 (07/28 0800)  Labs: Recent Labs    08/12/18 1815  08/13/18 0347  08/13/18 2200 08/14/18 0400 08/14/18 0635 08/14/18 0637 08/14/18 1051 08/15/18 0352  HGB  --   --  13.2  --   --   --   --   --  12.0 11.8*  HCT  --   --  42.1  --   --   --   --   --  38.9 40.1  PLT  --   --  94*  --   --   --   --   --  76* 75*  APTT 31   < >  --    < > 122*  --   --  93*  --  81*  HEPARINUNFRC >2.20*  --   --   --   --   --  1.96*  --   --   --   CREATININE  --   --  3.16*  --   --  3.20*  --   --   --  3.08*  CKTOTAL  --   --   --   --   --   --   --   --   --  74   < > = values in this interval not displayed.     Medical History: Past Medical History:  Diagnosis Date  . Asthma   . CKD (chronic kidney disease)   . Depression   . DVT (deep venous thrombosis) ([place])   . GERD (gastroesophageal reflux disease)   . Hypertension   . Hypothyroidism   . Osteoarthritis   . Schizophrenia ([place])   . Seizures ([place])      Assessment: Kelsey Leonard is a 63 y.o. female with PMH of HTN, asthma, LLE DVT 05/2017, CKD 4, who was [facility] on 7/25. She initially presented to [facility] on 7/20 for lip swelling. She was found to have hypoxia (SpO2 50%) with several weeks of dyspnea, leg swelling, HTN emergency, and CHF. On 7/25 she was found to have decreased AMS and myoclonic jerks. She was intubated and started on pressors at [facility]. Pharmacy consulted to dose heparin due to history of DVT. Pt was taking Apixaban 5mg  BID PTA. Last dose of apixaban given on 07/25 at 0900 at [place].   APTT is  therapeutic. plts down to 75, hgb stable  Goal of Therapy:  Heparin level 0.3-0.7 units/ml aPTT 66-102 seconds Monitor platelets by anticoagulation protocol: Yes   Plan:  -Continue heparin at 800 units/hr -Daily HL, CBC, aPTT   Harvel Quale 08/15/2018 8:50 AM   Addendum -Planning to change from heparin to Eliquis -Resume prior to admission Eliquis 5 mg po bid -Discontinue heparin at the same time Eliquis is started   Harvel Quale 08/15/2018 1:37 PM

## 2018-08-15 NOTE — Progress Notes (Signed)
Physical Therapy Treatment Patient Details Name: Kelsey Leonard MRN: [id number] DOB: 1955/08/30 Today's Date: 08/15/2018    History of Present Illness Pt adm from [place] with acute on chronic respiratory failure and MRSA bacteremia. Intubated 7/25-7/27. PMH - presumed Sarcoidosis (never biopsied), HTN, Asthma, Lt leg DVT May 2018, CKD 4, Schizophrenia, Depression, Hypothyroidism    PT Comments    Pt able to amb short distance in hallway. Progressing as expected.    Follow Up Recommendations  Home health PT;Supervision - Intermittent     Equipment Recommendations  Rolling walker with 5" wheels    Recommendations for Other Services       Precautions / Restrictions Precautions Precautions: Fall    Mobility  Bed Mobility         General bed mobility comments: Pt up in recliner  Transfers Overall transfer level: Needs assistance Equipment used: Rolling walker (2 wheeled) Transfers: Sit to/from Stand Sit to Stand: Min guard Stand pivot transfers: Min assist       General transfer comment: Assist for balance. Verbal cues for hand placement.  Ambulation/Gait Ambulation/Gait assistance: Min assist Gait Distance (Feet): 40 Feet(10' x 1, 40' x 1) Assistive device: Rolling walker (2 wheeled) Gait Pattern/deviations: Step-through pattern;Decreased step length - right;Decreased step length - left;Shuffle;Trunk flexed Gait velocity: decr Gait velocity interpretation: <1.31 ft/sec, indicative of household ambulator General Gait Details: Assist for balance and lines. Verbal cues to look up.l   Stairs             Wheelchair Mobility    Modified Rankin (Stroke Patients Only)       Balance Overall balance assessment: Needs assistance Sitting-balance support: No upper extremity supported;Feet supported Sitting balance-Leahy Scale: Good     Standing balance support: Single extremity supported Standing balance-Leahy Scale: Poor Standing balance comment:  walker and min guard for static standing                            Cognition Arousal/Alertness: Awake/alert Behavior During Therapy: WFL for tasks assessed/performed Overall Cognitive Status: Within Functional Limits for tasks assessed                                 General Comments: May have some slight delays at baseline      Exercises      General Comments General comments (skin integrity, edema, etc.): Pt on 3L of O2 at rest. When removed O2 SpO2 decr to 85%.      Pertinent Vitals/Pain Pain Assessment: No/denies pain    Home Living Family/patient expects to be discharged to:: Private residence Living Arrangements: Parent(mother and father) Available Help at Discharge: Family;Available PRN/intermittently Type of Home: House Home Access: Stairs to enter Entrance Stairs-Rails: Right Home Layout: One level Home Equipment: None Additional Comments: Pt's mother takes care of pts father    Prior Function Level of Independence: Needs assistance  Gait / Transfers Assistance Needed: Amb independently without assistive device       PT Goals (current goals can now be found in the care plan section) Acute Rehab PT Goals Patient Stated Goal: return home PT Goal Formulation: With patient Time For Goal Achievement: 08/29/18 Potential to Achieve Goals: Good Progress towards PT goals: Progressing toward goals    Frequency    Min 3X/week      PT Plan Current plan remains appropriate    Co-evaluation  AM-PAC PT "6 Clicks" Mobility   Outcome Measure  Help needed turning from your back to your side while in a flat bed without using bedrails?: None Help needed moving from lying on your back to sitting on the side of a flat bed without using bedrails?: A Little Help needed moving to and from a bed to a chair (including a wheelchair)?: A Little Help needed standing up from a chair using your arms (e.g., wheelchair or bedside  chair)?: A Little Help needed to walk in hospital room?: A Little Help needed climbing 3-5 steps with a railing? : A Lot 6 Click Score: 18    End of Session Equipment Utilized During Treatment: Gait belt;Oxygen Activity Tolerance: Patient tolerated treatment well Patient left: in chair;with call bell/phone within reach Nurse Communication: Mobility status PT Visit Diagnosis: Unsteadiness on feet (R26.81);Muscle weakness (generalized) (M62.81)     Time: 1000-1025 PT Time Calculation (min) (ACUTE ONLY): 25 min  Charges:  $Gait Training: 23-37 mins                     [place] Pager [phone] Office [place] 08/15/2018, 10:46 AM

## 2018-08-15 NOTE — Progress Notes (Signed)
eLink Physician-Brief Progress Note Patient Name: Kelsey Leonard DOB: 1955/09/15 MRN: [id number]   Date of Service  08/15/2018  HPI/Events of Note  Intermittent bradycardia without a drop in blood pressure. Pt is not on any negative chronotropic agents.  eICU Interventions  Obtain baseline EKG, no pharmacologic intervention for now, avoid negative chronotropic agents.        Kerry Kass [place] 08/15/2018, 12:48 AM

## 2018-08-16 DIAGNOSIS — N184 Chronic kidney disease, stage 4 (severe): Secondary | ICD-10-CM

## 2018-08-16 DIAGNOSIS — N179 Acute kidney failure, unspecified: Secondary | ICD-10-CM

## 2018-08-16 DIAGNOSIS — R7881 Bacteremia: Secondary | ICD-10-CM

## 2018-08-16 DIAGNOSIS — M549 Dorsalgia, unspecified: Secondary | ICD-10-CM

## 2018-08-16 DIAGNOSIS — G9341 Metabolic encephalopathy: Secondary | ICD-10-CM

## 2018-08-16 DIAGNOSIS — J9690 Respiratory failure, unspecified, unspecified whether with hypoxia or hypercapnia: Secondary | ICD-10-CM

## 2018-08-16 HISTORY — DX: Acute kidney failure, unspecified: N17.9

## 2018-08-16 HISTORY — DX: Bacteremia: R78.81

## 2018-08-16 HISTORY — DX: Metabolic encephalopathy: G93.41

## 2018-08-16 LAB — BASIC METABOLIC PANEL
Anion gap: 10 (ref 5–15)
BUN: 46 mg/dL — ABNORMAL HIGH (ref 6–20)
CO2: 35 mmol/L — ABNORMAL HIGH (ref 22–32)
Calcium: 9.6 mg/dL (ref 8.9–10.3)
Chloride: 98 mmol/L (ref 98–111)
Creatinine, Ser: 3.02 mg/dL — ABNORMAL HIGH (ref 0.44–1.00)
GFR calc Af Amer: 19 mL/min — ABNORMAL LOW (ref >60)
GFR calc non Af Amer: 16 mL/min — ABNORMAL LOW (ref >60)
Glucose, Bld: 94 mg/dL (ref 70–99)
Potassium: 4.5 mmol/L (ref 3.5–5.1)
Sodium: 143 mmol/L (ref 135–145)

## 2018-08-16 LAB — CULTURE, BLOOD (ROUTINE X 2)

## 2018-08-16 LAB — GLUCOSE, CAPILLARY
Glucose-Capillary: 84 mg/dL (ref 70–99)
Glucose-Capillary: 92 mg/dL (ref 70–99)
Glucose-Capillary: 96 mg/dL (ref 70–99)
Glucose-Capillary: 121 mg/dL — ABNORMAL HIGH (ref 70–99)
Glucose-Capillary: 110 mg/dL — ABNORMAL HIGH (ref 70–99)

## 2018-08-16 MED ORDER — SODIUM CHLORIDE 0.9 % IV SOLN
800.0000 mg | INTRAVENOUS | Status: DC
  Administered 2018-08-16 – 2018-08-22 (×4): 800 mg via INTRAVENOUS
  Filled 2018-08-16 (×5): qty 16

## 2018-08-16 NOTE — Progress Notes (Signed)
Occupational Therapy Evaluation Patient Details Name: Kelsey Leonard MRN: [id number] DOB: 01/05/1956 Today's Date: 08/16/2018    History of Present Illness Pt adm from [place] with acute on chronic respiratory failure and MRSA bacteremia. Intubated 7/25-7/27. PMH - presumed Sarcoidosis (never biopsied), HTN, Asthma, Lt leg DVT May 2018, CKD 4, Schizophrenia, Depression, Hypothyroidism   Clinical Impression   PTA, pt lived with her parents and was independent with ADL and mobility on RA and had assistance for IADL tasks from family. Pt currently requires min A with ADL and mobility. Desat to 90 on 4L during ADL and mobility. REbound to 95 in less than 1 min. Very pleasant and cooperative. Will follow acutely.     Follow Up Recommendations  Home health OT;Supervision/Assistance - 24 hour(initially)    Equipment Recommendations  3 in 1 bedside commode    Recommendations for Other Services       Precautions / Restrictions Precautions Precautions: Fall Precaution Comments: watch O2 Sats      Mobility Bed Mobility               General bed mobility comments: OOB in chair  Transfers Overall transfer level: Needs assistance Equipment used: Rolling walker (2 wheeled) Transfers: Sit to/from Stand Sit to Stand: Min guard              Balance     Sitting balance-Leahy Scale: Good       Standing balance-Leahy Scale: Fair                             ADL either performed or assessed with clinical judgement   ADL Overall ADL's : Needs assistance/impaired     Grooming: Set up;Standing;Supervision/safety   Upper Body Bathing: Set up;Sitting   Lower Body Bathing: Min guard;Sit to/from stand   Upper Body Dressing : Set up;Sitting   Lower Body Dressing: Sit to/from stand;Minimal assistance   Toilet Transfer: Minimal assistance;RW;Ambulation;Regular Toilet   Toileting- Clothing Manipulation and Hygiene: Supervision/safety;Sit to/from stand        Functional mobility during ADLs: Set up;Rolling walker;Cueing for safety       Vision         Perception     Praxis      Pertinent Vitals/Pain Pain Assessment: No/denies pain     Hand Dominance Right   Extremity/Trunk Assessment Upper Extremity Assessment Upper Extremity Assessment: Generalized weakness   Lower Extremity Assessment Lower Extremity Assessment: Defer to PT evaluation   Cervical / Trunk Assessment Cervical / Trunk Assessment: Kyphotic;Other exceptions(forward head)   Communication Communication Communication: No difficulties   Cognition Arousal/Alertness: Awake/alert Behavior During Therapy: WFL for tasks assessed/performed Overall Cognitive Status: History of cognitive impairments - at baseline(most likely at baseline)                                 General Comments: unable to teel me why she is in the ho[facility]spital; knows date and hospital   General Comments       Exercises     Shoulder Instructions      Home Living Family/patient expects to be discharged to:: Private residence Living Arrangements: Parent Available Help at Discharge: Family;Available PRN/intermittently Type of Home: House Home Access: Stairs to enter [employer] of Steps: 3-4 Entrance Stairs-Rails: Right Home Layout: One level     Bathroom Shower/Tub: Occupational psychologist: Standard Bathroom Accessibility:  Yes How Accessible: Accessible via walker Home Equipment: None          Prior Functioning/Environment Level of Independence: Needs assistance  Gait / Transfers Assistance Needed: Amb independently without assistive device     Comments: family assists with IADL tasks        OT Problem List: Decreased strength;Decreased activity tolerance;Impaired balance (sitting and/or standing);Decreased safety awareness;Cardiopulmonary status limiting activity;Obesity      OT Treatment/Interventions: Self-care/ADL training;Therapeutic  exercise;Energy conservation;DME and/or AE instruction;Therapeutic activities;Patient/family education;Balance training    OT Goals(Current goals can be found in the care plan section) Acute Rehab OT Goals Patient Stated Goal: return home OT Goal Formulation: With patient Time For Goal Achievement: 08/30/18 Potential to Achieve Goals: Good  OT Frequency: Min 2X/week   Barriers to D/C:            Co-evaluation              AM-PAC OT "6 Clicks" Daily Activity     Outcome Measure Help from another person eating meals?: None Help from another person taking care of personal grooming?: A Little Help from another person toileting, which includes using toliet, bedpan, or urinal?: A Little Help from another person bathing (including washing, rinsing, drying)?: A Little Help from another person to put on and taking off regular upper body clothing?: A Little Help from another person to put on and taking off regular lower body clothing?: A Little 6 Click Score: 19   End of Session Equipment Utilized During Treatment: Gait belt;Rolling walker;Oxygen(4L) Nurse Communication: Mobility status  Activity Tolerance: Patient tolerated treatment well Patient left: in chair;with call bell/phone within reach  OT Visit Diagnosis: Unsteadiness on feet (R26.81);Muscle weakness (generalized) (M62.81)                Time: 1610-9604 OT Time Calculation (min): 40 min Charges:  OT General Charges $OT Visit: 1 Visit OT Evaluation $OT Eval Moderate Complexity: 1 Mod OT Treatments $Self Care/Home Management : 23-37 mins  Maurie Boettcher, OT/L   Acute OT Clinical Specialist [place] Pager [phone] Office [phone]   [facility] 08/16/2018, 4:04 PM

## 2018-08-16 NOTE — Progress Notes (Signed)
Physical Therapy Treatment Patient Details Name: Kelsey Leonard MRN: [id number] DOB: October 01, 1955 Today's Date: 08/16/2018    History of Present Illness Pt adm from [place] with acute on chronic respiratory failure and MRSA bacteremia. Intubated 7/25-7/27. PMH - presumed Sarcoidosis (never biopsied), HTN, Asthma, Lt leg DVT May 2018, CKD 4, Schizophrenia, Depression, Hypothyroidism    PT Comments    Patient ambulating without external assistance, sat at 90% on 4L. Pt states she has family support at home. Agree with HHPT recs.   Follow Up Recommendations  Home health PT;Supervision - Intermittent     Equipment Recommendations  Rolling walker with 5" wheels    Recommendations for Other Services       Precautions / Restrictions Precautions Precautions: Fall Precaution Comments: watch O2 Sats Restrictions Weight Bearing Restrictions: No    Mobility  Bed Mobility               General bed mobility comments: OOB in chair  Transfers Overall transfer level: Needs assistance Equipment used: Rolling walker (2 wheeled) Transfers: Sit to/from Stand Sit to Stand: Min guard            Ambulation/Gait Ambulation/Gait assistance: Min guard Gait Distance (Feet): 30 Feet Assistive device: Rolling walker (2 wheeled) Gait Pattern/deviations: Step-through pattern;Decreased step length - right;Decreased step length - left;Shuffle;Trunk flexed Gait velocity: decreased   General Gait Details: mild unsteadiness, 90% on 4L with household distance.    Stairs             Wheelchair Mobility    Modified Rankin (Stroke Patients Only)       Balance     Sitting balance-Leahy Scale: Good       Standing balance-Leahy Scale: Fair                              Cognition Arousal/Alertness: Awake/alert Behavior During Therapy: WFL for tasks assessed/performed Overall Cognitive Status: History of cognitive impairments - at baseline(most likely at  baseline)                                 General Comments: unable to teel me why she is in the ho9spital; knows date and hospital      Exercises      General Comments        Pertinent Vitals/Pain Pain Assessment: No/denies pain    Home Living Family/patient expects to be discharged to:: Private residence Living Arrangements: Parent Available Help at Discharge: Family;Available PRN/intermittently Type of Home: House Home Access: Stairs to enter Entrance Stairs-Rails: Right Home Layout: One level Home Equipment: None      Prior Function Level of Independence: Needs assistance  Gait / Transfers Assistance Needed: Amb independently without assistive device   Comments: family assists with IADL tasks   PT Goals (current goals can now be found in the care plan section) Acute Rehab PT Goals Patient Stated Goal: return home PT Goal Formulation: With patient Time For Goal Achievement: 08/29/18 Potential to Achieve Goals: Good Progress towards PT goals: Progressing toward goals    Frequency    Min 3X/week      PT Plan Current plan remains appropriate    Co-evaluation              AM-PAC PT "6 Clicks" Mobility   Outcome Measure  Help needed turning from your back to your side while in a flat  bed without using bedrails?: None Help needed moving from lying on your back to sitting on the side of a flat bed without using bedrails?: A Little Help needed moving to and from a bed to a chair (including a wheelchair)?: A Little Help needed standing up from a chair using your arms (e.g., wheelchair or bedside chair)?: A Little Help needed to walk in hospital room?: A Little Help needed climbing 3-5 steps with a railing? : A Lot 6 Click Score: 18    End of Session Equipment Utilized During Treatment: Gait belt;Oxygen Activity Tolerance: Patient tolerated treatment well Patient left: in chair;with call bell/phone within reach Nurse Communication:  Mobility status PT Visit Diagnosis: Unsteadiness on feet (R26.81);Muscle weakness (generalized) (M62.81)     Time: 4332-9518 PT Time Calculation (min) (ACUTE ONLY): 19 min  Charges:  $Gait Training: 8-22 mins                     Reinaldo Berber, PT, DPT Acute Rehabilitation Services Pager: [phone] Office: [phone]   Reinaldo Berber 08/16/2018, 5:08 PM

## 2018-08-16 NOTE — Progress Notes (Signed)
[facility] for Infectious Disease  Date of Admission:  08/12/2018     Total days of antibiotics 5        ASSESSMENT/PLAN  Kelsey Leonard is a 63 y/o female admitted from outside hospital with altered mental status, dyspnea and lower extremity edema found to have MRSA bacteremia. TTE completed with no evidence of vegetation. Source of infection remains unclear at present although suspect possible respiratory origin given admission symptoms.   MSRA bacteremia - Repeat cultures remain with Coag negative staph in 1/4 bottles likely representing a contaminate as previously noted. Will need prolonged IV therapy and okay for central line placement. Recommend TEE when able to rule out endocarditis. Continue current dose of daptomycin given most recent creatinine of 3.08.   Respiratory Failure - Remains extubated and stable on nasal cannula.   Therapeutic Drug Monitoring - Continue to monitor CK levels while on daptomycin.  Acute Kidney Injury on CKD Stage IV - Appears stable. Most recent creatinine of 3.08. Will require central line placement given renal function.   Principal Problem:   Respiratory failure ([place]) Active Problems:   CKD (chronic kidney disease), stage IV (HCC)   Asthma   AKI (acute kidney injury) ([place])   Bacteremia due to Staphylococcus aureus   Acute metabolic encephalopathy   . apixaban  5 mg Oral BID  . Chlorhexidine Gluconate Cloth  6 each Topical Daily  . mouth rinse  15 mL Mouth Rinse BID    SUBJECTIVE:  Afebrile overnight with no acute events. Blood culture with 1/4 Coag Negative Staph. Having back pain and not feeling great today.  Allergies  Allergen Reactions  . Aspirin Nausea And Vomiting  . Penicillins Hives    Has patient had a PCN reaction causing immediate rash, facial/tongue/throat swelling, SOB or lightheadedness with hypotension: Yes Has patient had a PCN reaction causing severe rash involving mucus membranes or skin necrosis: Yes Has patient  had a PCN reaction that required hospitalization: Yes Has patient had a PCN reaction occurring within the last 10 years: No If all of the above answers are "NO", then may proceed with Cephalosporin use.   . Shellfish Allergy Other (See Comments)  . Sulfa Antibiotics Rash     Review of Systems: Review of Systems  Constitutional: Negative for chills, fever and weight loss.  Respiratory: Negative for cough, shortness of breath and wheezing.   Cardiovascular: Negative for chest pain and leg swelling.  Gastrointestinal: Negative for abdominal pain, constipation, diarrhea, nausea and vomiting.  Musculoskeletal: Positive for back pain.  Skin: Negative for rash.      OBJECTIVE: Vitals:   08/16/18 0700 08/16/18 0739 08/16/18 0800 08/16/18 0900  BP: 135/80  (!) 114/56 (!) 125/109  Pulse: (!) 52  (!) 56 (!) 58  Resp: (!) 21  14 19   Temp:  98.3 F (36.8 C)    TempSrc:  Oral    SpO2: 100%  95% 100%  Weight:      Height:       Body mass index is 43.32 kg/m.  Physical Exam Constitutional:      General: She is not in acute distress.    Appearance: She is well-developed. She is ill-appearing.     Interventions: Nasal cannula in place.     Comments: Seated in the chair; pleasant.   Cardiovascular:     Rate and Rhythm: Normal rate and regular rhythm.     Heart sounds: Normal heart sounds.  Pulmonary:     Effort: Pulmonary effort  is normal.     Breath sounds: Normal breath sounds.  Skin:    General: Skin is warm and dry.  Neurological:     Mental Status: She is alert and oriented to person, place, and time.  Psychiatric:        Behavior: Behavior normal.        Thought Content: Thought content normal.        Judgment: Judgment normal.     Lab Results Lab Results  Component Value Date   WBC 5.5 08/15/2018   HGB 11.8 (L) 08/15/2018   HCT 40.1 08/15/2018   MCV 95.2 08/15/2018   PLT 75 (L) 08/15/2018    Lab Results  Component Value Date   CREATININE 3.08 (H) 08/15/2018    BUN 50 (H) 08/15/2018   NA 141 08/15/2018   K 3.6 08/15/2018   CL 96 (L) 08/15/2018   CO2 34 (H) 08/15/2018    Lab Results  Component Value Date   ALT 23 08/15/2018   AST 36 08/15/2018   ALKPHOS 76 08/15/2018   BILITOT 1.0 08/15/2018     Microbiology: Recent Results (from the past 240 hour(s))  MRSA PCR Screening     Status: Abnormal   Collection Time: 08/12/18  3:31 PM   Specimen: Nasopharyngeal  Result Value Ref Range Status   MRSA by PCR POSITIVE (A) NEGATIVE Final    Comment:        The GeneXpert MRSA Assay (FDA approved for NASAL specimens only), is one component of a comprehensive MRSA colonization surveillance program. It is not intended to diagnose MRSA infection nor to guide or monitor treatment for MRSA infections. RESULT CALLED TO, READ BACK BY AND VERIFIED WITH: J.BEABRAUT RN AT 1610 08/12/2018 BY A.DAVIS Performed at [facility], [place] [address] [place], [place] [address]   Culture, blood (Routine X 2) w Reflex to ID Panel     Status: None (Preliminary result)   Collection Time: 08/12/18  4:30 PM   Specimen: BLOOD LEFT ARM  Result Value Ref Range Status   Specimen Description BLOOD LEFT ARM  Final   Special Requests   Final    BOTTLES DRAWN AEROBIC ONLY Blood Culture results may not be optimal due to an inadequate volume of blood received in culture bottles   Culture   Final    NO GROWTH 4 DAYS Performed at [facility], [place] [address] [place], [place] [address]    Report Status PENDING  Incomplete  Culture, blood (Routine X 2) w Reflex to ID Panel     Status: Abnormal   Collection Time: 08/12/18  4:43 PM   Specimen: BLOOD LEFT HAND  Result Value Ref Range Status   Specimen Description BLOOD LEFT HAND  Final   Special Requests   Final    BOTTLES DRAWN AEROBIC ONLY Blood Culture results may not be optimal due to an inadequate volume of blood received in culture bottles   Culture  Setup Time   Final    AEROBIC BOTTLE ONLY GRAM POSITIVE  COCCI CRITICAL RESULT CALLED TO, READ BACK BY AND VERIFIED WITH: Tillman Sers PHARMD 1914 08/14/18 A BROWNING    Culture (A)  Final    STAPHYLOCOCCUS SPECIES (COAGULASE NEGATIVE) THE SIGNIFICANCE OF ISOLATING THIS ORGANISM FROM A SINGLE SET OF BLOOD CULTURES WHEN MULTIPLE SETS ARE DRAWN IS UNCERTAIN. PLEASE NOTIFY THE MICROBIOLOGY DEPARTMENT WITHIN ONE WEEK IF SPECIATION AND SENSITIVITIES ARE REQUIRED. Performed at [facility], [place],  [place] [address]    Report Status 08/16/2018 FINAL  Final  Blood Culture ID Panel (Reflexed)     Status: Abnormal   Collection Time: 08/12/18  4:43 PM  Result Value Ref Range Status   Enterococcus species NOT DETECTED NOT DETECTED Final   Listeria monocytogenes NOT DETECTED NOT DETECTED Final   Staphylococcus species DETECTED (A) NOT DETECTED Final    Comment: Methicillin (oxacillin) susceptible coagulase negative staphylococcus. Possible blood culture contaminant (unless isolated from more than one blood culture draw or clinical case suggests pathogenicity). No antibiotic treatment is indicated for blood  culture contaminants. CRITICAL RESULT CALLED TO, READ BACK BY AND VERIFIED WITH: Tillman Sers PHARMD 0626 08/14/18 A BROWNING    Staphylococcus aureus (BCID) NOT DETECTED NOT DETECTED Final   Methicillin resistance NOT DETECTED NOT DETECTED Final   Streptococcus species NOT DETECTED NOT DETECTED Final   Streptococcus agalactiae NOT DETECTED NOT DETECTED Final   Streptococcus pneumoniae NOT DETECTED NOT DETECTED Final   Streptococcus pyogenes NOT DETECTED NOT DETECTED Final   Acinetobacter baumannii NOT DETECTED NOT DETECTED Final   Enterobacteriaceae species NOT DETECTED NOT DETECTED Final   Enterobacter cloacae complex NOT DETECTED NOT DETECTED Final   Escherichia coli NOT DETECTED NOT DETECTED Final   Klebsiella oxytoca NOT DETECTED NOT DETECTED Final   Klebsiella pneumoniae NOT DETECTED NOT DETECTED Final   Proteus species NOT  DETECTED NOT DETECTED Final   Serratia marcescens NOT DETECTED NOT DETECTED Final   Haemophilus influenzae NOT DETECTED NOT DETECTED Final   Neisseria meningitidis NOT DETECTED NOT DETECTED Final   Pseudomonas aeruginosa NOT DETECTED NOT DETECTED Final   Candida albicans NOT DETECTED NOT DETECTED Final   Candida glabrata NOT DETECTED NOT DETECTED Final   Candida krusei NOT DETECTED NOT DETECTED Final   Candida parapsilosis NOT DETECTED NOT DETECTED Final   Candida tropicalis NOT DETECTED NOT DETECTED Final    Comment: Performed at [facility], [place]. [address] [place], [place] [address]  Culture, respiratory (non-expectorated)     Status: None   Collection Time: 08/12/18 11:47 PM   Specimen: Endotracheal; Respiratory  Result Value Ref Range Status   Specimen Description ENDOTRACHEAL  Final   Special Requests NONE  Final   Gram Stain   Final    ABUNDANT WBC PRESENT,BOTH PMN AND MONONUCLEAR FEW GRAM POSITIVE COCCI RARE GRAM NEGATIVE RODS    Culture   Final    Consistent with normal respiratory flora. Performed at [facility], [place] [address] [place], [place] [address]    Report Status 08/15/2018 FINAL  Final     Terri Piedra, NP [place] [facility] [phone] Pager  08/16/2018  9:40 AM

## 2018-08-16 NOTE — Progress Notes (Addendum)
PROGRESS NOTE    TYYNE CLIETT  VPX:[id number] DOB: 05-28-1955 DOA: 08/12/2018 PCP: System, Pcp Not In    Brief Narrative:  63 year old who presented with altered mental status.  Patient had several weeks of dyspnea, lower extremity edema, and weight gain.  Was admitted to [facility] with acute hypoxic respiratory failure 07-20.  He was diagnosed with staph aureus bacteremia.  She had rapid worsening of her mentation with worsening hypoxic and hypercapnic respiratory failure that required intubation and mechanical ventilation started on vasopressors (July 25) and transferred to [facility] for further management.  AT the time of her transfer her blood pressure was 126/102, pulse rate 66, temperature 98.8, respiratory rate 18, oxygen saturation 98% on assist control mechanical ventilation.  Sodium 138, potassium 3.9, chloride 92, bicarb 36, glucose 137, BUN 38, creatinine 3.1, white count 7.2, hemoglobin 13.2, hematocrit 42.1, platelets 94.  Her chest radiograph had a right base atelectasis, mild cardiomegaly, right subclavian central venous catheter, nasogastric tube with tip below the diaphragm.  Endotracheal tube above the carina.  Patient was admitted to the hospital with a working diagnosis of acute on chronic hypoxic, hypercapnic in the setting of acute metabolic encephalopathy.\  Patient's mentation and hemodynamics improved, received antibiotic therapy with vancomycin and now with daptomycin.  She was successfully liberated from mechanical ventilation July 27.   Transferred to [facility] July 29.   Assessment & Plan:   Principal Problem:   Respiratory failure ([place]) Active Problems:   CKD (chronic kidney disease), stage IV (HCC)   Asthma   AKI (acute kidney injury) ([place])   Bacteremia due to Staphylococcus aureus   Acute metabolic encephalopathy   1. Acute on chronic hypoxic and hypercapnic respiratory failure. Patient now off mechanical ventilation, oxymetry has been 94 to 100% on 4 LPM  per [place]. Continue to taper off supplemental oxygen. Physical therapy and out of bed to chair.   2. Metabolic encephalopathy. Patient is more awake and reactive following commands, continue neuro checks per unit protocol.   3. MRSA bacteremia. Last blood culture from 0725 only one bottle positive for coagulase negative staph, likely a contamination. Transthoracic echo with no vegetation. Right [facility] central line has been removed. Patient now on daptomycin. Wbc today 5,5. Patient will need TEE.   4. AKI on CKD stage IV. Renal function with serum cr at 3,0 from 3,2, will follow on renal panel in am. Continue IV daptomycin, avoid hypotension and nephrotoxic medications.     5. Obesity. BMI 43.3  6. Left lower extremity DVT (2018). Continue anticoagulation with apixaban.   DVT prophylaxis: apixaban   Code Status:  full Family Communication: no family at the bedside  Disposition Plan/ discharge barriers: transfer to medical ward.   Body mass index is 43.32 kg/m. Malnutrition Type:  Nutrition Problem: Inadequate oral intake Etiology: inability to eat   Malnutrition Characteristics:  Signs/Symptoms: NPO status   Nutrition Interventions:  Interventions: MVI, Tube feeding, Prostat  RN Pressure Injury Documentation:     Consultants:   ID  Procedures:     Antimicrobials:   Vancomycin 07-20 to 07-28 <  Daptomycin 07-28 >   Subjective: Patient is out of bed, no dyspnea and no chest pain, no nausea or vomiting. Tolerating po well.   Objective: Vitals:   08/16/18 0600 08/16/18 0700 08/16/18 0739 08/16/18 0800  BP: (!) 111/53 135/80  (!) 114/56  Pulse: (!) 50 (!) 52  (!) 56  Resp: 16 (!) 21  14  Temp:   98.3 F (36.8  C)   TempSrc:   Oral   SpO2: 100% 100%  95%  Weight:      Height:        Intake/Output Summary (Last 24 hours) at 08/16/2018 0848 Last data filed at 08/16/2018 0800 Gross per 24 hour  Intake 1081.16 ml  Output 140 ml  Net 941.16 ml   Filed Weights    08/14/18 0115 08/15/18 0420 08/16/18 0500  Weight: 102.1 kg 101.7 kg 104 kg    Examination:   General: deconditioned and ill looking appearing  Neurology: Awake and alert, non focal  E ENT: mild pallor, no icterus, oral mucosa moist Cardiovascular: No JVD. S1-S2 present, rhythmic, no gallops, rubs, or murmurs. ++ non pitting lower extremity edema. Pulmonary: positive breath sounds bilaterally, adequate air movement, no wheezing, rhonchi or rales. Gastrointestinal. Abdomen with, no organomegaly, non tender, no rebound or guarding Skin. No rashes Musculoskeletal: no joint deformities     Data Reviewed: I have personally reviewed following labs and imaging studies  CBC: Recent Labs  Lab 08/12/18 1609 08/13/18 0347 08/14/18 1051 08/15/18 0352  WBC  --  7.2 7.7 5.5  HGB 16.3* 13.2 12.0 11.8*  HCT 48.0* 42.1 38.9 40.1  MCV  --  90.3 91.7 95.2  PLT  --  94* 76* 75*   Basic Metabolic Panel: Recent Labs  Lab 08/12/18 1609 08/12/18 1643 08/13/18 0347 08/13/18 1725 08/14/18 0400 08/15/18 0352  NA 138  --  138  --  140 141  K 4.1  --  3.9  --  3.7 3.6  CL  --   --  92*  --  96* 96*  CO2  --   --  36*  --  35* 34*  GLUCOSE  --   --  137*  --  128* 104*  BUN  --   --  38*  --  50* 50*  CREATININE  --   --  3.16*  --  3.20* 3.08*  CALCIUM  --   --  9.3  --  8.9 9.1  MG  --  2.1 2.1 2.2 2.2  --   PHOS  --  1.9* 2.6 2.1* 1.9*  --    GFR: Estimated Creatinine Clearance: 21.6 mL/min (A) (by C-G formula based on SCr of 3.08 mg/dL (H)). Liver Function Tests: Recent Labs  Lab 08/13/18 0347 08/14/18 0400 08/15/18 0352  AST 111* 49* 36  ALT 42 29 23  ALKPHOS 110 88 76  BILITOT 2.3* 1.4* 1.0  PROT 6.3* 6.0* 6.2*  ALBUMIN 3.1* 2.8* 2.9*   No results for input(s): LIPASE, AMYLASE in the last 168 hours. Recent Labs  Lab 08/13/18 0347  AMMONIA 19   Coagulation Profile: No results for input(s): INR, PROTIME in the last 168 hours. Cardiac Enzymes: Recent Labs  Lab  08/15/18 0352  CKTOTAL 74   BNP (last 3 results) No results for input(s): PROBNP in the last 8760 hours. HbA1C: No results for input(s): HGBA1C in the last 72 hours. CBG: Recent Labs  Lab 08/15/18 1530 08/15/18 2010 08/15/18 2351 08/16/18 0350 08/16/18 0735  GLUCAP 188* 108* 92 84 92   Lipid Profile: No results for input(s): CHOL, HDL, LDLCALC, TRIG, CHOLHDL, LDLDIRECT in the last 72 hours. Thyroid Function Tests: No results for input(s): TSH, T4TOTAL, FREET4, T3FREE, THYROIDAB in the last 72 hours. Anemia Panel: No results for input(s): VITAMINB12, FOLATE, FERRITIN, TIBC, IRON, RETICCTPCT in the last 72 hours.    Radiology Studies: I have reviewed all of the imaging  during this hospital visit personally     Scheduled Meds: . apixaban  5 mg Oral BID  . Chlorhexidine Gluconate Cloth  6 each Topical Daily  . mouth rinse  15 mL Mouth Rinse BID   Continuous Infusions: . DAPTOmycin (CUBICIN)  IV    . lactated ringers 50 mL/hr at 08/16/18 0800     LOS: 4 days        Kiala Faraj Gerome Apley, MD

## 2018-08-17 ENCOUNTER — Other Ambulatory Visit: Payer: Self-pay

## 2018-08-17 LAB — BASIC METABOLIC PANEL
Anion gap: 8 (ref 5–15)
BUN: 44 mg/dL — ABNORMAL HIGH (ref 6–20)
CO2: 34 mmol/L — ABNORMAL HIGH (ref 22–32)
Calcium: 9.6 mg/dL (ref 8.9–10.3)
Chloride: 98 mmol/L (ref 98–111)
Creatinine, Ser: 2.89 mg/dL — ABNORMAL HIGH (ref 0.44–1.00)
GFR calc Af Amer: 20 mL/min — ABNORMAL LOW (ref >60)
GFR calc non Af Amer: 17 mL/min — ABNORMAL LOW (ref >60)
Glucose, Bld: 87 mg/dL (ref 70–99)
Potassium: 4.5 mmol/L (ref 3.5–5.1)
Sodium: 140 mmol/L (ref 135–145)

## 2018-08-17 LAB — GLUCOSE, CAPILLARY
Glucose-Capillary: 87 mg/dL (ref 70–99)
Glucose-Capillary: 82 mg/dL (ref 70–99)
Glucose-Capillary: 93 mg/dL (ref 70–99)
Glucose-Capillary: 143 mg/dL — ABNORMAL HIGH (ref 70–99)
Glucose-Capillary: 130 mg/dL — ABNORMAL HIGH (ref 70–99)
Glucose-Capillary: 90 mg/dL (ref 70–99)

## 2018-08-17 LAB — CBC
HCT: 37.7 % (ref 36.0–46.0)
Hemoglobin: 11.2 g/dL — ABNORMAL LOW (ref 12.0–15.0)
MCH: 28.3 pg (ref 26.0–34.0)
MCHC: 29.7 g/dL — ABNORMAL LOW (ref 30.0–36.0)
MCV: 95.2 fL (ref 80.0–100.0)
Platelets: 109 10*3/uL — ABNORMAL LOW (ref 150–400)
RBC: 3.96 MIL/uL (ref 3.87–5.11)
RDW: 17.2 % — ABNORMAL HIGH (ref 11.5–15.5)
WBC: 4.8 10*3/uL (ref 4.0–10.5)
nRBC: 0 % (ref 0.0–0.2)

## 2018-08-17 LAB — CULTURE, BLOOD (ROUTINE X 2): Culture: NO GROWTH

## 2018-08-17 MED ORDER — MUPIROCIN 2 % EX OINT
1.0000 "application " | TOPICAL_OINTMENT | Freq: Two times a day (BID) | CUTANEOUS | Status: AC
  Administered 2018-08-17 – 2018-08-21 (×10): 1 via NASAL
  Filled 2018-08-17 (×4): qty 22

## 2018-08-17 NOTE — Progress Notes (Signed)
Daily nursing note.   Assessed patient in morning noted to not be in any acute distress, eating breakfast. On 2L nasal canula. Got out of bed to chair with stand by assistance, tolerated well. TEE pending, PT worked with Pt today. Supplemental oxygen applied to 4L when in up walking and while eating. Oxygen maintains 85-90% even while walking with PT. Discharge plan depending on when TEE gets done. Will continue to monitor.   Marcene Brawn, RN 08/17/18 4:40 PM

## 2018-08-17 NOTE — Discharge Instructions (Signed)

## 2018-08-17 NOTE — Progress Notes (Signed)
Physical Therapy Treatment Patient Details Name: Kelsey Leonard MRN: [id number] DOB: 1955/09/04 Today's Date: 08/17/2018    History of Present Illness Pt adm from [place] with acute on chronic respiratory failure and MRSA bacteremia. Intubated 7/25-7/27. PMH - presumed Sarcoidosis (never biopsied), HTN, Asthma, Lt leg DVT May 2018, CKD 4, Schizophrenia, Depression, Hypothyroidism    PT Comments    Pt making good progress. Continues to require supplemental O2.    Follow Up Recommendations  Home health PT;Supervision - Intermittent     Equipment Recommendations  Rolling walker with 5" wheels    Recommendations for Other Services       Precautions / Restrictions Precautions Precautions: Fall Precaution Comments: watch O2 Sats    Mobility  Bed Mobility               General bed mobility comments: OOB in chair  Transfers Overall transfer level: Needs assistance Equipment used: Rolling walker (2 wheeled) Transfers: Sit to/from Stand Sit to Stand: Supervision         General transfer comment: supervision for lines  Ambulation/Gait Ambulation/Gait assistance: Min guard Gait Distance (Feet): 120 Feet Assistive device: Rolling walker (2 wheeled) Gait Pattern/deviations: Step-through pattern;Trunk flexed;Decreased stride length Gait velocity: decreased Gait velocity interpretation: <1.31 ft/sec, indicative of household ambulator General Gait Details: Assist for lines/safety. Amb on 4L with SpO2 85% after amb   Stairs             Wheelchair Mobility    Modified Rankin (Stroke Patients Only)       Balance Overall balance assessment: Needs assistance Sitting-balance support: No upper extremity supported Sitting balance-Leahy Scale: Good     Standing balance support: No upper extremity supported Standing balance-Leahy Scale: Fair                              Cognition Arousal/Alertness: Awake/alert Behavior During Therapy: WFL for  tasks assessed/performed Overall Cognitive Status: History of cognitive impairments - at baseline(most likely at baseline)                                 General Comments: suspect baseline delay      Exercises      General Comments        Pertinent Vitals/Pain Pain Assessment: No/denies pain    Home Living                      Prior Function            PT Goals (current goals can now be found in the care plan section) Acute Rehab PT Goals Patient Stated Goal: return home Progress towards PT goals: Progressing toward goals    Frequency    Min 3X/week      PT Plan Current plan remains appropriate    Co-evaluation              AM-PAC PT "6 Clicks" Mobility   Outcome Measure  Help needed turning from your back to your side while in a flat bed without using bedrails?: None Help needed moving from lying on your back to sitting on the side of a flat bed without using bedrails?: A Little Help needed moving to and from a bed to a chair (including a wheelchair)?: A Little Help needed standing up from a chair using your arms (e.g., wheelchair or bedside chair)?: None Help  needed to walk in hospital room?: A Little Help needed climbing 3-5 steps with a railing? : A Lot 6 Click Score: 19    End of Session Equipment Utilized During Treatment: Oxygen Activity Tolerance: Patient tolerated treatment well Patient left: in chair;with call bell/phone within reach Nurse Communication: Mobility status PT Visit Diagnosis: Unsteadiness on feet (R26.81);Muscle weakness (generalized) (M62.81)     Time: 0865-7846 PT Time Calculation (min) (ACUTE ONLY): 14 min  Charges:  $Gait Training: 8-22 mins                     [place] Pager [phone] Office [place] 08/17/2018, 4:44 PM

## 2018-08-17 NOTE — Plan of Care (Signed)
  Problem: Nutrition: Goal: Adequate nutrition will be maintained Outcome: Progressing   Problem: Coping: Goal: Level of anxiety will decrease Outcome: Progressing   Problem: Pain Managment: Goal: General experience of comfort will improve Outcome: Progressing   Problem: Education: Goal: Ability to demonstrate management of disease process will improve Outcome: Not Progressing Goal: Ability to verbalize understanding of medication therapies will improve Outcome: Not Progressing   Problem: Education: Goal: Knowledge of General Education information will improve Description: Including pain rating scale, medication(s)/side effects and non-pharmacologic comfort measures Outcome: Not Progressing

## 2018-08-17 NOTE — Progress Notes (Signed)
    [facility] has been requested to perform a transesophageal echocardiogram on 08/18/2018 for bacteremia.  After careful review of history and examination, the risks and benefits of transesophageal echocardiogram have been explained including risks of esophageal damage, perforation (1:10,000 risk), bleeding, pharyngeal hematoma as well as other potential complications associated with conscious sedation including aspiration, arrhythmia, respiratory failure and death. Alternatives to treatment were discussed, questions were answered. Patient is willing to proceed.   TEE scheduled for 08/18/2018 at 3:00pm with Dr. Harrington Challenger.  Roby Lofts, PA-C 08/17/2018 12:12 PM

## 2018-08-17 NOTE — Progress Notes (Signed)
[facility] for Infectious Disease  Date of Admission:  08/12/2018     Total days of antibiotics 6         ASSESSMENT/PLAN  Kelsey Leonard is a 63 year old female admitted from outside hospital with altered mental status, dyspnea, and lower extremity edema found to have MRSA bacteremia.  TTE completed with no evidence of vegetations/endocarditis with heart valve function preserved.  Source of infection remains unclear at present although suspect respiratory origin given admission symptoms.  Moved out of the ICU and doing better today.  MRSA bacteremia -outside hospital blood cultures finalized without growth and repeat cultures here with coag negative staph in 1/4 bottles likely representing contaminant.  Awaiting TEE.  Final recommendations for treatment pending results.  Respiratory failure -now off oxygen and saturating well on room air.  Therapeutic drug monitoring -CK level of 74 on 08/15/2018.  Continue current dose of daptomycin and monitor CK levels throughout therapy.   Principal Problem:   Bacteremia due to Staphylococcus aureus Active Problems:   Respiratory failure (HCC)   CKD (chronic kidney disease), stage IV (HCC)   Asthma   AKI (acute kidney injury) ([place])   Acute metabolic encephalopathy   . apixaban  5 mg Oral BID  . Chlorhexidine Gluconate Cloth  6 each Topical Daily  . mouth rinse  15 mL Mouth Rinse BID  . mupirocin ointment  1 application Nasal BID    SUBJECTIVE:  Afebrile overnight with no acute events.  Most recent white blood cell count of 4.8.  Previous blood cultures from outside hospital finalized negative from 4/22.  Feeling better today.  Seated in chair watching television.  Allergies  Allergen Reactions  . Aspirin Nausea And Vomiting  . Penicillins Hives    Has patient had a PCN reaction causing immediate rash, facial/tongue/throat swelling, SOB or lightheadedness with hypotension: Yes Has patient had a PCN reaction causing severe rash  involving mucus membranes or skin necrosis: Yes Has patient had a PCN reaction that required hospitalization: Yes Has patient had a PCN reaction occurring within the last 10 years: No If all of the above answers are "NO", then may proceed with Cephalosporin use.   . Shellfish Allergy Other (See Comments)  . Sulfa Antibiotics Rash     Review of Systems: Review of Systems  Constitutional: Negative for chills, fever and weight loss.  Respiratory: Negative for cough, shortness of breath and wheezing.   Cardiovascular: Negative for chest pain and leg swelling.  Gastrointestinal: Negative for abdominal pain, constipation, diarrhea, nausea and vomiting.  Skin: Negative for rash.      OBJECTIVE: Vitals:   08/16/18 1959 08/16/18 2328 08/17/18 0600 08/17/18 0758  BP: (!) 129/46 131/64  (!) 113/47  Pulse: (!) 59 66  64  Resp: 18 (!) 21  14  Temp: 98.8 F (37.1 C) 98.6 F (37 C)  98.9 F (37.2 C)  TempSrc: Oral   Oral  SpO2: 100% 98%  98%  Weight:   105.7 kg   Height:       Body mass index is 44.03 kg/m.  Physical Exam Constitutional:      General: She is awake. She is not in acute distress.    Appearance: She is well-developed. She is not ill-appearing.     Comments: Seated in the chair; pleasant  Cardiovascular:     Rate and Rhythm: Normal rate and regular rhythm.     Heart sounds: Normal heart sounds.  Pulmonary:     Effort: Pulmonary effort is  normal.     Breath sounds: Normal breath sounds. No wheezing, rhonchi or rales.  Chest:     Chest wall: No tenderness.  Skin:    General: Skin is warm and dry.  Neurological:     Mental Status: She is alert and oriented to person, place, and time.  Psychiatric:        Mood and Affect: Mood normal.        Behavior: Behavior is cooperative.     Lab Results Lab Results  Component Value Date   WBC 4.8 08/17/2018   HGB 11.2 (L) 08/17/2018   HCT 37.7 08/17/2018   MCV 95.2 08/17/2018   PLT 109 (L) 08/17/2018    Lab  Results  Component Value Date   CREATININE 2.89 (H) 08/17/2018   BUN 44 (H) 08/17/2018   NA 140 08/17/2018   K 4.5 08/17/2018   CL 98 08/17/2018   CO2 34 (H) 08/17/2018    Lab Results  Component Value Date   ALT 23 08/15/2018   AST 36 08/15/2018   ALKPHOS 76 08/15/2018   BILITOT 1.0 08/15/2018     Microbiology: Recent Results (from the past 240 hour(s))  MRSA PCR Screening     Status: Abnormal   Collection Time: 08/12/18  3:31 PM   Specimen: Nasopharyngeal  Result Value Ref Range Status   MRSA by PCR POSITIVE (A) NEGATIVE Final    Comment:        The GeneXpert MRSA Assay (FDA approved for NASAL specimens only), is one component of a comprehensive MRSA colonization surveillance program. It is not intended to diagnose MRSA infection nor to guide or monitor treatment for MRSA infections. RESULT CALLED TO, READ BACK BY AND VERIFIED WITH: J.BEABRAUT RN AT 7253 08/12/2018 BY A.DAVIS Performed at [facility], [place] [address], [place], [place] [address]   Culture, blood (Routine X 2) w Reflex to ID Panel     Status: None   Collection Time: 08/12/18  4:30 PM   Specimen: BLOOD LEFT ARM  Result Value Ref Range Status   Specimen Description BLOOD LEFT ARM  Final   Special Requests   Final    BOTTLES DRAWN AEROBIC ONLY Blood Culture results may not be optimal due to an inadequate volume of blood received in culture bottles   Culture   Final    NO GROWTH 5 DAYS Performed at [facility], [place] [address] [place], [place] [address]    Report Status 08/17/2018 FINAL  Final  Culture, blood (Routine X 2) w Reflex to ID Panel     Status: Abnormal   Collection Time: 08/12/18  4:43 PM   Specimen: BLOOD LEFT HAND  Result Value Ref Range Status   Specimen Description BLOOD LEFT HAND  Final   Special Requests   Final    BOTTLES DRAWN AEROBIC ONLY Blood Culture results may not be optimal due to an inadequate volume of blood received in culture bottles   Culture  Setup Time    Final    AEROBIC BOTTLE ONLY GRAM POSITIVE COCCI CRITICAL RESULT CALLED TO, READ BACK BY AND VERIFIED WITH: Tillman Sers PHARMD [id number] 08/14/18 A BROWNING    Culture (A)  Final    STAPHYLOCOCCUS SPECIES (COAGULASE NEGATIVE) THE SIGNIFICANCE OF ISOLATING THIS ORGANISM FROM A SINGLE SET OF BLOOD CULTURES WHEN MULTIPLE SETS ARE DRAWN IS UNCERTAIN. PLEASE NOTIFY THE MICROBIOLOGY DEPARTMENT WITHIN ONE WEEK IF SPECIATION AND SENSITIVITIES ARE REQUIRED. Performed at [facility], [place]  [address], [place], [place] [address]    Report Status 08/16/2018 FINAL  Final  Blood Culture ID Panel (Reflexed)     Status: Abnormal   Collection Time: 08/12/18  4:43 PM  Result Value Ref Range Status   Enterococcus species NOT DETECTED NOT DETECTED Final   Listeria monocytogenes NOT DETECTED NOT DETECTED Final   Staphylococcus species DETECTED (A) NOT DETECTED Final    Comment: Methicillin (oxacillin) susceptible coagulase negative staphylococcus. Possible blood culture contaminant (unless isolated from more than one blood culture draw or clinical case suggests pathogenicity). No antibiotic treatment is indicated for blood  culture contaminants. CRITICAL RESULT CALLED TO, READ BACK BY AND VERIFIED WITH: Tillman Sers PHARMD [id number] 08/14/18 A BROWNING    Staphylococcus aureus (BCID) NOT DETECTED NOT DETECTED Final   Methicillin resistance NOT DETECTED NOT DETECTED Final   Streptococcus species NOT DETECTED NOT DETECTED Final   Streptococcus agalactiae NOT DETECTED NOT DETECTED Final   Streptococcus pneumoniae NOT DETECTED NOT DETECTED Final   Streptococcus pyogenes NOT DETECTED NOT DETECTED Final   Acinetobacter baumannii NOT DETECTED NOT DETECTED Final   Enterobacteriaceae species NOT DETECTED NOT DETECTED Final   Enterobacter cloacae complex NOT DETECTED NOT DETECTED Final   Escherichia coli NOT DETECTED NOT DETECTED Final   Klebsiella oxytoca NOT DETECTED NOT DETECTED Final   Klebsiella pneumoniae NOT DETECTED NOT  DETECTED Final   Proteus species NOT DETECTED NOT DETECTED Final   Serratia marcescens NOT DETECTED NOT DETECTED Final   Haemophilus influenzae NOT DETECTED NOT DETECTED Final   Neisseria meningitidis NOT DETECTED NOT DETECTED Final   Pseudomonas aeruginosa NOT DETECTED NOT DETECTED Final   Candida albicans NOT DETECTED NOT DETECTED Final   Candida glabrata NOT DETECTED NOT DETECTED Final   Candida krusei NOT DETECTED NOT DETECTED Final   Candida parapsilosis NOT DETECTED NOT DETECTED Final   Candida tropicalis NOT DETECTED NOT DETECTED Final    Comment: Performed at [facility], [place]. [address] [place], [place] [address]  Culture, respiratory (non-expectorated)     Status: None   Collection Time: 08/12/18 11:47 PM   Specimen: Endotracheal; Respiratory  Result Value Ref Range Status   Specimen Description ENDOTRACHEAL  Final   Special Requests NONE  Final   Gram Stain   Final    ABUNDANT WBC PRESENT,BOTH PMN AND MONONUCLEAR FEW GRAM POSITIVE COCCI RARE GRAM NEGATIVE RODS    Culture   Final    Consistent with normal respiratory flora. Performed at [facility], [place] [address] [place], [place] [address]    Report Status 08/15/2018 FINAL  Final     Terri Piedra, NP [facility] [phone] Pager  08/17/2018  11:20 AM

## 2018-08-17 NOTE — Progress Notes (Signed)
Pharmacy Antibiotic Note  Kelsey Leonard is a 63 y.o. female admitted on 08/12/2018 with bacteremia.  Pharmacy has been consulted for Daptomycin dosing.  ID: Dapto for MRSA bacteremia (Bcx positive at [facility] on 07/20).  TTE completed with no evidence of vegetations/endocarditis. Scr 2.89 down. - 7/28 CK 74  Vancomycin 07/20 >> 7/27 - 7/26 VR 37  - 7/27 VR 27  Dapto 7/28 >>  [place]: 7/20 BCx (RH) >> MRSA (called RH to confirm) 7//22 BCx (RH) >> ngtd  [place]: 7/25 BCx: 1/2 CoNS 7/25 MRSA PCR: Positive 7/25 RCx >> GS with GPC/GNR >> NF  Plan: - Daptomycin 8 mg/kg IV q48h - CK q Tues TEE 7/31   Height: 5\' 1"  (154.9 cm) Weight: 233 lb 0.4 oz (105.7 kg) IBW/kg (Calculated) : 47.8  Temp (24hrs), Avg:98.7 F (37.1 C), Min:98.3 F (36.8 C), Max:98.9 F (37.2 C)  Recent Labs  Lab 08/13/18 0347 08/14/18 0400 08/14/18 1051 08/15/18 0352 08/16/18 0917 08/17/18 0601 08/17/18 0714  WBC 7.2  --  7.7 5.5  --  4.8  --   CREATININE 3.16* 3.20*  --  3.08* 3.02*  --  2.89*  VANCORANDOM 37 27  --   --   --   --   --     Estimated Creatinine Clearance: 23.2 mL/min (A) (by C-G formula based on SCr of 2.89 mg/dL (H)).    Allergies  Allergen Reactions  . Aspirin Nausea And Vomiting  . Penicillins Hives    Has patient had a PCN reaction causing immediate rash, facial/tongue/throat swelling, SOB or lightheadedness with hypotension: Yes Has patient had a PCN reaction causing severe rash involving mucus membranes or skin necrosis: Yes Has patient had a PCN reaction that required hospitalization: Yes Has patient had a PCN reaction occurring within the last 10 years: No If all of the above answers are "NO", then may proceed with Cephalosporin use.   . Shellfish Allergy Other (See Comments)  . Sulfa Antibiotics Rash    Kelsey Leonard, PharmD, [place] Clinical Staff Pharmacist Kelsey Leonard 08/17/2018 12:33 PM

## 2018-08-17 NOTE — Progress Notes (Signed)
PROGRESS NOTE    Kelsey Leonard  WUJ:[id number] DOB: 06-Aug-1955 DOA: 08/12/2018 PCP: System, Pcp Not In    Brief Narrative:  63 year old who presented with altered mental status.  Patient had several weeks of dyspnea, lower extremity edema, and weight gain.  Was admitted to [facility] with acute hypoxic respiratory failure 07-20.  He was diagnosed with staph aureus bacteremia.  She had rapid worsening of her mentation with worsening hypoxic and hypercapnic respiratory failure that required intubation and mechanical ventilation started on vasopressors (July 25) and transferred to [facility] for further management.  AT the time of her transfer her blood pressure was 126/102, pulse rate 66, temperature 98.8, respiratory rate 18, oxygen saturation 98% on assist control mechanical ventilation.  Sodium 138, potassium 3.9, chloride 92, bicarb 36, glucose 137, BUN 38, creatinine 3.1, white count 7.2, hemoglobin 13.2, hematocrit 42.1, platelets 94.  Her chest radiograph had a right base atelectasis, mild cardiomegaly, right subclavian central venous catheter, nasogastric tube with tip below the diaphragm.  Endotracheal tube above the carina.  Patient was admitted to the hospital with a working diagnosis of acute on chronic hypoxic, hypercapnic in the setting of acute metabolic encephalopathy.\  Patient's mentation and hemodynamics improved, received antibiotic therapy with vancomycin and now with daptomycin.  She was successfully liberated from mechanical ventilation July 27.   Transferred to [facility] July 29.    Assessment & Plan:   Principal Problem:   Bacteremia due to Staphylococcus aureus Active Problems:   Respiratory failure (HCC)   CKD (chronic kidney disease), stage IV (HCC)   Asthma   AKI (acute kidney injury) ([place])   Acute metabolic encephalopathy   1. Acute on chronic hypoxic and hypercapnic respiratory failure. Dyspnea continue to improve, oxygenating 98% on 3 LPM per [place], will  continue to get patient out of bed and physical therapy. Continue as needed duoneb.   2. Metabolic encephalopathy. Seems to be resolved, patient is at her baseline and tolerating po well.  3. MRSA bacteremia. Last blood culture from 0725 only one bottle positive for coagulase negative staph, (likely a contamination). Patient has remained afebrile with no leukocytosis. Cardiology has been consulted for TEE. Will continue antibiotic therapy with Daptomycin.   4. AKI on CKD stage IV with metabolic alkalosis. Continue to improve renal function with serum cr down to 2,89 with K at 4,5 and serum bicarbonate at 34. Patient now off IV fluids and tolerating po well, continue to avoid nephrotoxic medications and hypotension.      5. Obesity. Her calculated BMI is 43.3  6. Left lower extremity DVT (2018).  On apixaban for anticoagulation with good toleration.   DVT prophylaxis: apixaban   Code Status:  full Family Communication: no family at the bedside  Disposition Plan/ discharge barriers: Pending TEE for placement, possible home with home health services.     Body mass index is 44.03 kg/m. Malnutrition Type:  Nutrition Problem: Inadequate oral intake Etiology: inability to eat   Malnutrition Characteristics:  Signs/Symptoms: NPO status   Nutrition Interventions:  Interventions: MVI, Tube feeding, Prostat  RN Pressure Injury Documentation:     Consultants:    ID   Procedures:     Antimicrobials:       Subjective: Patient is feeling well, dyspnea has been improving, but not yet back to baseline, no nausea or vomiting, no chest pain. Continue to be weak and deconditioned.   Objective: Vitals:   08/16/18 1959 08/16/18 2328 08/17/18 0600 08/17/18 0758  BP: (!) 129/46 131/64  Marland Kitchen)  113/47  Pulse: (!) 59 66  64  Resp: 18 (!) 21  14  Temp: 98.8 F (37.1 C) 98.6 F (37 C)  98.9 F (37.2 C)  TempSrc: Oral   Oral  SpO2: 100% 98%  98%  Weight:   105.7 kg   Height:         Intake/Output Summary (Last 24 hours) at 08/17/2018 1202 Last data filed at 08/17/2018 1047 Gross per 24 hour  Intake 116 ml  Output 550 ml  Net -434 ml   Filed Weights   08/15/18 0420 08/16/18 0500 08/17/18 0600  Weight: 101.7 kg 104 kg 105.7 kg    Examination:   General: deconditioned  Neurology: Awake and alert, non focal  E ENT: mild pallor, no icterus, oral mucosa moist Cardiovascular: No JVD. S1-S2 present, rhythmic, no gallops, rubs, or murmurs. No lower extremity edema. Pulmonary: positive breath sounds bilaterally, adequate air movement, no wheezing, rhonchi or rales. Gastrointestinal. Abdomen with no organomegaly, non tender, no rebound or guarding Skin. No rashes Musculoskeletal: no joint deformities     Data Reviewed: I have personally reviewed following labs and imaging studies  CBC: Recent Labs  Lab 08/12/18 1609 08/13/18 0347 08/14/18 1051 08/15/18 0352 08/17/18 0601  WBC  --  7.2 7.7 5.5 4.8  HGB 16.3* 13.2 12.0 11.8* 11.2*  HCT 48.0* 42.1 38.9 40.1 37.7  MCV  --  90.3 91.7 95.2 95.2  PLT  --  94* 76* 75* 623*   Basic Metabolic Panel: Recent Labs  Lab 08/12/18 1643 08/13/18 0347 08/13/18 1725 08/14/18 0400 08/15/18 0352 08/16/18 0917 08/17/18 0714  NA  --  138  --  140 141 143 140  K  --  3.9  --  3.7 3.6 4.5 4.5  CL  --  92*  --  96* 96* 98 98  CO2  --  36*  --  35* 34* 35* 34*  GLUCOSE  --  137*  --  128* 104* 94 87  BUN  --  38*  --  50* 50* 46* 44*  CREATININE  --  3.16*  --  3.20* 3.08* 3.02* 2.89*  CALCIUM  --  9.3  --  8.9 9.1 9.6 9.6  MG 2.1 2.1 2.2 2.2  --   --   --   PHOS 1.9* 2.6 2.1* 1.9*  --   --   --    GFR: Estimated Creatinine Clearance: 23.2 mL/min (A) (by C-G formula based on SCr of 2.89 mg/dL (H)). Liver Function Tests: Recent Labs  Lab 08/13/18 0347 08/14/18 0400 08/15/18 0352  AST 111* 49* 36  ALT 42 29 23  ALKPHOS 110 88 76  BILITOT 2.3* 1.4* 1.0  PROT 6.3* 6.0* 6.2*  ALBUMIN 3.1* 2.8* 2.9*   No  results for input(s): LIPASE, AMYLASE in the last 168 hours. Recent Labs  Lab 08/13/18 0347  AMMONIA 19   Coagulation Profile: No results for input(s): INR, PROTIME in the last 168 hours. Cardiac Enzymes: Recent Labs  Lab 08/15/18 0352  CKTOTAL 74   BNP (last 3 results) No results for input(s): PROBNP in the last 8760 hours. HbA1C: No results for input(s): HGBA1C in the last 72 hours. CBG: Recent Labs  Lab 08/16/18 1742 08/16/18 1941 08/17/18 0559 08/17/18 0800 08/17/18 1149  GLUCAP 121* 110* 87 82 93   Lipid Profile: No results for input(s): CHOL, HDL, LDLCALC, TRIG, CHOLHDL, LDLDIRECT in the last 72 hours. Thyroid Function Tests: No results for input(s): TSH, T4TOTAL, FREET4, T3FREE, THYROIDAB  in the last 72 hours. Anemia Panel: No results for input(s): VITAMINB12, FOLATE, FERRITIN, TIBC, IRON, RETICCTPCT in the last 72 hours.    Radiology Studies: I have reviewed all of the imaging during this hospital visit personally     Scheduled Meds: . apixaban  5 mg Oral BID  . Chlorhexidine Gluconate Cloth  6 each Topical Daily  . mouth rinse  15 mL Mouth Rinse BID  . mupirocin ointment  1 application Nasal BID   Continuous Infusions: . DAPTOmycin (CUBICIN)  IV 800 mg (08/16/18 2040)     LOS: 5 days        Tilia Faso Gerome Apley, MD

## 2018-08-18 ENCOUNTER — Other Ambulatory Visit (HOSPITAL_COMMUNITY): Payer: Medicaid Other

## 2018-08-18 ENCOUNTER — Encounter (HOSPITAL_COMMUNITY)
Admission: AD | Disposition: A | Discharge status: Home / Self Care | Payer: Self-pay | Source: Other Acute Inpatient Hospital | Attending: Internal Medicine

## 2018-08-18 ENCOUNTER — Ambulatory Visit (HOSPITAL_COMMUNITY): Admit: 2018-08-18 | Payer: Medicaid Other | Admitting: Internal Medicine

## 2018-08-18 DIAGNOSIS — J441 Chronic obstructive pulmonary disease with (acute) exacerbation: Secondary | ICD-10-CM

## 2018-08-18 LAB — GLUCOSE, CAPILLARY
Glucose-Capillary: 82 mg/dL (ref 70–99)
Glucose-Capillary: 74 mg/dL (ref 70–99)
Glucose-Capillary: 124 mg/dL — ABNORMAL HIGH (ref 70–99)
Glucose-Capillary: 109 mg/dL — ABNORMAL HIGH (ref 70–99)
Glucose-Capillary: 135 mg/dL — ABNORMAL HIGH (ref 70–99)
Glucose-Capillary: 98 mg/dL (ref 70–99)

## 2018-08-18 LAB — CBC
HCT: 42.2 % (ref 36.0–46.0)
Hemoglobin: 12.5 g/dL (ref 12.0–15.0)
MCH: 28.2 pg (ref 26.0–34.0)
MCHC: 29.6 g/dL — ABNORMAL LOW (ref 30.0–36.0)
MCV: 95.3 fL (ref 80.0–100.0)
Platelets: 89 10*3/uL — ABNORMAL LOW (ref 150–400)
RBC: 4.43 MIL/uL (ref 3.87–5.11)
RDW: 17.4 % — ABNORMAL HIGH (ref 11.5–15.5)
WBC: 4.9 10*3/uL (ref 4.0–10.5)
nRBC: 0 % (ref 0.0–0.2)

## 2018-08-18 SURGERY — ECHOCARDIOGRAM, TRANSESOPHAGEAL
Anesthesia: Moderate Sedation

## 2018-08-18 MED ORDER — IPRATROPIUM-ALBUTEROL 0.5-2.5 (3) MG/3ML IN SOLN
3.0000 mL | Freq: Two times a day (BID) | RESPIRATORY_TRACT | Status: DC
  Administered 2018-08-18 – 2018-08-24 (×12): 3 mL via RESPIRATORY_TRACT
  Filled 2018-08-18 (×12): qty 3

## 2018-08-18 MED ORDER — MOMETASONE FURO-FORMOTEROL FUM 200-5 MCG/ACT IN AERO
2.0000 | INHALATION_SPRAY | Freq: Two times a day (BID) | RESPIRATORY_TRACT | Status: DC
  Filled 2018-08-18: qty 8.8

## 2018-08-18 MED ORDER — IPRATROPIUM-ALBUTEROL 0.5-2.5 (3) MG/3ML IN SOLN
3.0000 mL | Freq: Four times a day (QID) | RESPIRATORY_TRACT | Status: DC
  Administered 2018-08-18: 3 mL via RESPIRATORY_TRACT
  Filled 2018-08-18: qty 3

## 2018-08-18 MED ORDER — MOMETASONE FURO-FORMOTEROL FUM 200-5 MCG/ACT IN AERO
2.0000 | INHALATION_SPRAY | Freq: Two times a day (BID) | RESPIRATORY_TRACT | Status: DC
  Administered 2018-08-18 – 2018-08-24 (×12): 2 via RESPIRATORY_TRACT
  Filled 2018-08-18: qty 8.8

## 2018-08-18 NOTE — TOC Progression Note (Signed)
Transition of Care [facility]) - Progression Note    Patient Details  Name: Kelsey Leonard MRN: [id number] Date of Birth: 07-24-1955  Transition of [facility]) CM/SW Contact  Pollie Friar, RN Phone Number: 08/18/2018, 2:24 PM  Clinical Narrative:    Spoke to patients sister and mother over the phone. Pts sister has experience with IV abx at home and is willing to assist the patient at home with the IV abx. Sister: [name] [phone]. Pam with IV therapy notified and is following for potential home IV abx.  TOC following.        Expected Discharge Plan and Services                                                 Social Determinants of Health (SDOH) Interventions    Readmission Risk Interventions No flowsheet data found.

## 2018-08-18 NOTE — Progress Notes (Signed)
NPO order not placed for patient for TEE. She received her lunch tray and began eating just prior to schedule procedure time. She ate a few pieces of broccoli and a few sips of iced tea. Notified endoscopy staff. Endo will call back to reschedule.

## 2018-08-18 NOTE — Progress Notes (Signed)
Physical Therapy Treatment Patient Details Name: Kelsey Leonard MRN: [id number] DOB: 08-15-1955 Today's Date: 08/18/2018    History of Present Illness Pt adm from [place] with acute on chronic respiratory failure and MRSA bacteremia. Intubated 7/25-7/27. PMH - presumed Sarcoidosis (never biopsied), HTN, Asthma, Lt leg DVT May 2018, CKD 4, Schizophrenia, Depression, Hypothyroidism    PT Comments    Pt continues to progress with mobility. Continues to require supplemental O2.    Follow Up Recommendations  Home health PT;Supervision - Intermittent     Equipment Recommendations  Rolling walker with 5" wheels    Recommendations for Other Services       Precautions / Restrictions Precautions Precautions: Fall Precaution Comments: watch O2 Sats    Mobility  Bed Mobility               General bed mobility comments: OOB in chair  Transfers Overall transfer level: Needs assistance Equipment used: Rolling walker (2 wheeled) Transfers: Sit to/from Stand Sit to Stand: Supervision         General transfer comment: supervision for lines  Ambulation/Gait Ambulation/Gait assistance: Supervision Gait Distance (Feet): 150 Feet Assistive device: Rolling walker (2 wheeled) Gait Pattern/deviations: Step-through pattern;Trunk flexed;Decreased stride length Gait velocity: decreased Gait velocity interpretation: 1.31 - 2.62 ft/sec, indicative of limited community ambulator General Gait Details: Assist for lines/safety. Amb on 3L with SpO2 85% after amb   Stairs             Wheelchair Mobility    Modified Rankin (Stroke Patients Only)       Balance Overall balance assessment: Needs assistance Sitting-balance support: No upper extremity supported Sitting balance-Leahy Scale: Good     Standing balance support: No upper extremity supported Standing balance-Leahy Scale: Fair                              Cognition Arousal/Alertness:  Awake/alert Behavior During Therapy: WFL for tasks assessed/performed Overall Cognitive Status: History of cognitive impairments - at baseline                                 General Comments: suspect baseline delay      Exercises      General Comments        Pertinent Vitals/Pain      Home Living                      Prior Function            PT Goals (current goals can now be found in the care plan section) Acute Rehab PT Goals Patient Stated Goal: return home Progress towards PT goals: Progressing toward goals    Frequency    Min 3X/week      PT Plan Current plan remains appropriate    Co-evaluation              AM-PAC PT "6 Clicks" Mobility   Outcome Measure  Help needed turning from your back to your side while in a flat bed without using bedrails?: None Help needed moving from lying on your back to sitting on the side of a flat bed without using bedrails?: A Little Help needed moving to and from a bed to a chair (including a wheelchair)?: A Little Help needed standing up from a chair using your arms (e.g., wheelchair or bedside chair)?: None Help needed  to walk in hospital room?: A Little Help needed climbing 3-5 steps with a railing? : A Lot 6 Click Score: 19    End of Session Equipment Utilized During Treatment: Oxygen Activity Tolerance: Patient tolerated treatment well Patient left: in chair;with call bell/phone within reach Nurse Communication: Mobility status PT Visit Diagnosis: Unsteadiness on feet (R26.81);Muscle weakness (generalized) (M62.81)     Time: 4834-7583 PT Time Calculation (min) (ACUTE ONLY): 20 min  Charges:  $Gait Training: 8-22 mins                     [place] Pager [phone] Office [place] 08/18/2018, 5:24 PM

## 2018-08-18 NOTE — Progress Notes (Signed)
[facility] for Infectious Disease   Reason for visit: Follow up on MRSA bacteremia  Interval History: TEE canceled today.  No fever, wbc wnl.  No new events.    Physical Exam: Constitutional:  Vitals:   08/18/18 1320 08/18/18 1635  BP:  (!) 145/85  Pulse:  (!) 54  Resp:    Temp:  98.8 F (37.1 C)  SpO2: 98% 100%   patient appears in NAD Respiratory: Normal respiratory effort; CTA B Cardiovascular: RRR GI: soft, nt, nd  Review of Systems: Constitutional: negative for fevers and chills Gastrointestinal: negative for nausea and diarrhea  Lab Results  Component Value Date   WBC 4.9 08/18/2018   HGB 12.5 08/18/2018   HCT 42.2 08/18/2018   MCV 95.3 08/18/2018   PLT 89 (L) 08/18/2018    Lab Results  Component Value Date   CREATININE 2.89 (H) 08/17/2018   BUN 44 (H) 08/17/2018   NA 140 08/17/2018   K 4.5 08/17/2018   CL 98 08/17/2018   CO2 34 (H) 08/17/2018    Lab Results  Component Value Date   ALT 23 08/15/2018   AST 36 08/15/2018   ALKPHOS 76 08/15/2018     Microbiology: Recent Results (from the past 240 hour(s))  MRSA PCR Screening     Status: Abnormal   Collection Time: 08/12/18  3:31 PM   Specimen: Nasopharyngeal  Result Value Ref Range Status   MRSA by PCR POSITIVE (A) NEGATIVE Final    Comment:        The GeneXpert MRSA Assay (FDA approved for NASAL specimens only), is one component of a comprehensive MRSA colonization surveillance program. It is not intended to diagnose MRSA infection nor to guide or monitor treatment for MRSA infections. RESULT CALLED TO, READ BACK BY AND VERIFIED WITH: J.BEABRAUT RN AT 3086 08/12/2018 BY A.DAVIS Performed at [facility], [place] [address], [place], [place] [address]   Culture, blood (Routine X 2) w Reflex to ID Panel     Status: None   Collection Time: 08/12/18  4:30 PM   Specimen: BLOOD LEFT ARM  Result Value Ref Range Status   Specimen Description BLOOD LEFT ARM  Final   Special Requests    Final    BOTTLES DRAWN AEROBIC ONLY Blood Culture results may not be optimal due to an inadequate volume of blood received in culture bottles   Culture   Final    NO GROWTH 5 DAYS Performed at [facility], [place] [address] [place], [place] [address]    Report Status 08/17/2018 FINAL  Final  Culture, blood (Routine X 2) w Reflex to ID Panel     Status: Abnormal   Collection Time: 08/12/18  4:43 PM   Specimen: BLOOD LEFT HAND  Result Value Ref Range Status   Specimen Description BLOOD LEFT HAND  Final   Special Requests   Final    BOTTLES DRAWN AEROBIC ONLY Blood Culture results may not be optimal due to an inadequate volume of blood received in culture bottles   Culture  Setup Time   Final    AEROBIC BOTTLE ONLY GRAM POSITIVE COCCI CRITICAL RESULT CALLED TO, READ BACK BY AND VERIFIED WITH: Tillman Sers PHARMD 2841 08/14/18 A BROWNING    Culture (A)  Final    STAPHYLOCOCCUS SPECIES (COAGULASE NEGATIVE) THE SIGNIFICANCE OF ISOLATING THIS ORGANISM FROM A SINGLE SET OF BLOOD CULTURES WHEN MULTIPLE SETS ARE DRAWN IS UNCERTAIN. PLEASE NOTIFY THE MICROBIOLOGY DEPARTMENT WITHIN ONE WEEK  IF SPECIATION AND SENSITIVITIES ARE REQUIRED. Performed at [facility], [place] [address], [place], [place] [address]    Report Status 08/16/2018 FINAL  Final  Blood Culture ID Panel (Reflexed)     Status: Abnormal   Collection Time: 08/12/18  4:43 PM  Result Value Ref Range Status   Enterococcus species NOT DETECTED NOT DETECTED Final   Listeria monocytogenes NOT DETECTED NOT DETECTED Final   Staphylococcus species DETECTED (A) NOT DETECTED Final    Comment: Methicillin (oxacillin) susceptible coagulase negative staphylococcus. Possible blood culture contaminant (unless isolated from more than one blood culture draw or clinical case suggests pathogenicity). No antibiotic treatment is indicated for blood  culture contaminants. CRITICAL RESULT CALLED TO, READ BACK BY AND VERIFIED WITH: Tillman Sers PHARMD  2956 08/14/18 A BROWNING    Staphylococcus aureus (BCID) NOT DETECTED NOT DETECTED Final   Methicillin resistance NOT DETECTED NOT DETECTED Final   Streptococcus species NOT DETECTED NOT DETECTED Final   Streptococcus agalactiae NOT DETECTED NOT DETECTED Final   Streptococcus pneumoniae NOT DETECTED NOT DETECTED Final   Streptococcus pyogenes NOT DETECTED NOT DETECTED Final   Acinetobacter baumannii NOT DETECTED NOT DETECTED Final   Enterobacteriaceae species NOT DETECTED NOT DETECTED Final   Enterobacter cloacae complex NOT DETECTED NOT DETECTED Final   Escherichia coli NOT DETECTED NOT DETECTED Final   Klebsiella oxytoca NOT DETECTED NOT DETECTED Final   Klebsiella pneumoniae NOT DETECTED NOT DETECTED Final   Proteus species NOT DETECTED NOT DETECTED Final   Serratia marcescens NOT DETECTED NOT DETECTED Final   Haemophilus influenzae NOT DETECTED NOT DETECTED Final   Neisseria meningitidis NOT DETECTED NOT DETECTED Final   Pseudomonas aeruginosa NOT DETECTED NOT DETECTED Final   Candida albicans NOT DETECTED NOT DETECTED Final   Candida glabrata NOT DETECTED NOT DETECTED Final   Candida krusei NOT DETECTED NOT DETECTED Final   Candida parapsilosis NOT DETECTED NOT DETECTED Final   Candida tropicalis NOT DETECTED NOT DETECTED Final    Comment: Performed at [facility], [place] [address], [place], [place] [address]  Culture, respiratory (non-expectorated)     Status: None   Collection Time: 08/12/18 11:47 PM   Specimen: Endotracheal; Respiratory  Result Value Ref Range Status   Specimen Description ENDOTRACHEAL  Final   Special Requests NONE  Final   Gram Stain   Final    ABUNDANT WBC PRESENT,BOTH PMN AND MONONUCLEAR FEW GRAM POSITIVE COCCI RARE GRAM NEGATIVE RODS    Culture   Final    Consistent with normal respiratory flora. Performed at [facility], [place] [address] [place], [place] [address]    Report Status 08/15/2018 FINAL  Final    Impression/Plan:  1. MRSA  bacteremia - TEE not yet done. Will wait for results.   2.  Medication monitoring - on daptomycin and tolerating.   Dr. Tommy Medal will follow up on Monday

## 2018-08-18 NOTE — Progress Notes (Signed)
Was informed that the patient's orders for NPO status for procedure were never released, therefore patient got lunch tray and ate all of her broccoli. I notified MD Harrington Challenger and she said that we would have to cancel that case. Floor nurse [place] notified.

## 2018-08-18 NOTE — Progress Notes (Signed)
Occupational Therapy Treatment Patient Details Name: Kelsey Leonard MRN: [id number] DOB: 21-Sep-1955 Today's Date: 08/18/2018    History of present illness Pt adm from [place] with acute on chronic respiratory failure and MRSA bacteremia. Intubated 7/25-7/27. PMH - presumed Sarcoidosis (never biopsied), HTN, Asthma, Lt leg DVT May 2018, CKD 4, Schizophrenia, Depression, Hypothyroidism   OT comments  Pt progressing toward established goals. Pt currently requires S-minguard for ADL completion while standing and mingaurd for functional mobility at RW level. Continued to reinforce energy conservation strategies and pursed lip breathing during session. Pt on 3Lnc SpO2 89%-91% throughout session with good pleth reading. Pt will continue to benefit from skilled OT services to maximize safety and independence with ADL/IADL and functional mobility. Will continue to follow acutely and progress as tolerated.     Follow Up Recommendations  Home health OT;Supervision/Assistance - 24 hour    Equipment Recommendations  3 in 1 bedside commode    Recommendations for Other Services      Precautions / Restrictions Precautions Precautions: Fall Precaution Comments: watch O2 Sats Restrictions Weight Bearing Restrictions: No       Mobility Bed Mobility               General bed mobility comments: on BSC upon arrival  Transfers Overall transfer level: Needs assistance Equipment used: Rolling walker (2 wheeled) Transfers: Sit to/from Stand Sit to Stand: Supervision         General transfer comment: supervision for lines    Balance Overall balance assessment: Needs assistance Sitting-balance support: No upper extremity supported Sitting balance-Leahy Scale: Good     Standing balance support: No upper extremity supported;During functional activity Standing balance-Leahy Scale: Fair Standing balance comment: walker and Supervision for static standing                            ADL either performed or assessed with clinical judgement   ADL Overall ADL's : Needs assistance/impaired Eating/Feeding: Set up;Sitting   Grooming: Wash/dry hands;Standing;Supervision/safety                   Toilet Transfer: Min Lobbyist Details (indicate cue type and reason): minguard for safety during functional transfer from [facility] to sink and return to recliner;drop in SpO2 unsure accurate pleth reading Toileting- Clothing Manipulation and Hygiene: Supervision/safety;Sit to/from stand       Functional mobility during ADLs: Supervision/safety;Min guard;Rolling walker General ADL Comments: S-minguard for functional mobiltiy based on demands of balance     Vision       Perception     Praxis      Cognition Arousal/Alertness: Awake/alert Behavior During Therapy: WFL for tasks assessed/performed Overall Cognitive Status: History of cognitive impairments - at baseline                                 General Comments: suspect baseline delay        Exercises     Shoulder Instructions       General Comments Pt on 3Lnc SpO2 89%-91% throughout session with good pleth reading;poor pleth wave when pt was washing her hands, SpO2 read in 70s, pt no reports of dizziness or lightheadedness;pleth return to good wave, SpO2 89%    Pertinent Vitals/ Pain       Pain Assessment: No/denies pain  Home Living  Prior Functioning/Environment              Frequency  Min 2X/week        Progress Toward Goals  OT Goals(current goals can now be found in the care plan section)  Progress towards OT goals: Progressing toward goals  Acute Rehab OT Goals Patient Stated Goal: return home OT Goal Formulation: With patient Time For Goal Achievement: 08/30/18 Potential to Achieve Goals: Good ADL Goals Pt Will Perform Lower Body Bathing: with modified independence;sit to/from  stand Pt Will Perform Lower Body Dressing: with modified independence;sit to/from stand Pt Will Transfer to Toilet: with modified independence;ambulating;bedside commode Pt Will Perform Toileting - Clothing Manipulation and hygiene: with modified independence Additional ADL Goal #1: Pt will complete ADL task at sink level with SpO2 sats remaining greater than 88 on RA.  Plan Discharge plan remains appropriate    Co-evaluation                 AM-PAC OT "6 Clicks" Daily Activity     Outcome Measure   Help from another person eating meals?: A Little Help from another person taking care of personal grooming?: A Little Help from another person toileting, which includes using toliet, bedpan, or urinal?: A Little Help from another person bathing (including washing, rinsing, drying)?: A Little Help from another person to put on and taking off regular upper body clothing?: A Little Help from another person to put on and taking off regular lower body clothing?: A Little 6 Click Score: 18    End of Session Equipment Utilized During Treatment: Gait belt;Rolling walker;Oxygen(3L)  OT Visit Diagnosis: Unsteadiness on feet (R26.81);Muscle weakness (generalized) (M62.81)   Activity Tolerance Patient tolerated treatment well   Patient Left in chair;with call bell/phone within reach   Nurse Communication Mobility status        Time: 4599-7741 OT Time Calculation (min): 9 min  Charges: OT General Charges $OT Visit: 1 Visit OT Treatments $Self Care/Home Management : 8-22 mins  Dorinda Hill OTR/L [place] Office: [place] 08/18/2018, 1:14 PM

## 2018-08-18 NOTE — Progress Notes (Signed)
SATURATION QUALIFICATIONS: (This note is used to comply with regulatory documentation for home oxygen)  Patient Saturations on Room Air at Rest = 88%  Patient Saturations on Room Air while Ambulating = Did not attempt amb on room air due to low SpO2 on RA at rest  Patient Saturations on 3 Liters of oxygen while Ambulating = 85%  Please briefly explain why patient needs home oxygen:Pt unable to maintain adequate oxygenation at rest or with activity without supplemental O2.  [place] Pager [phone] Office [phone]

## 2018-08-18 NOTE — Progress Notes (Addendum)
PROGRESS NOTE    Kelsey Leonard  PVX:[id number] DOB: Feb 07, 1955 DOA: 08/12/2018 PCP: System, Pcp Not In    Brief Narrative:  63 year old who presented with altered mental status. Patient had several weeks of dyspnea, lower extremity edema, and weight gain. Was admitted to [facility] with acute hypoxic respiratory failure 07-20. He was diagnosed with staph aureusbacteremia. She had rapid worsening of her mentation with worsening hypoxic and hypercapnic respiratory failure that required intubation and mechanical ventilation. She was started onvasopressors (July 25) and transferred to [facility] further management. AT the time of her transferherblood pressure was 126/102, pulse rate 66, temperature 98.8, respiratory rate 18, oxygen saturation 98% on assist control mechanical ventilation. Sodium 138, potassium 3.9, chloride 92, bicarb 36, glucose 137, BUN 38, creatinine 3.1,white count 7.2, hemoglobin 13.2, hematocrit 42.1, platelets 94.Her chest radiograph had a right base atelectasis, mild cardiomegaly, right subclavian central venous catheter, nasogastric tube with tip below the diaphragm. Endotracheal tube above the carina.  Patientwas admitted to the hospital with a working diagnosis of acute on chronic hypoxic, hypercapnic in the setting of acute metabolic encephalopathy.\  Patient's mentation and hemodynamics improved, received antibiotic therapy with vancomycin and now with daptomycin. She was successfully liberated from mechanical ventilation July 27.  Transferred to [facility] July 29.   Assessment & Plan:   Principal Problem:   Bacteremia due to Staphylococcus aureus Active Problems:   Respiratory failure (HCC)   CKD (chronic kidney disease), stage IV (HCC)   Asthma   AKI (acute kidney injury) ([place])   Acute metabolic encephalopathy   1. Acute on chronic hypoxic and hypercapnic respiratory failure/ COPD exacerbation. Today patient with worsening wheezing, will  add scheduled albuterol-ipratropium, inhaled corticosteroid and long acting b agonist. Now on 2 LPM with oxygenation 91 to 98%. Continue physical therapy.   2. Metabolic encephalopathy. No confusion or agitation.   3. MRSA bacteremia.Last blood culture from 07/25 only one bottle positive for coagulase negative staph, (likely a contamination). Tolerating well daptomycin. Plan for today TEE. Follow with further ID recommendations.   4. AKI on CKD stage IV with metabolic alkalosis. Patient tolerating po well, will follow on renal panel in am.   5. Obesity.  BMI is 43.3  6. Left lower extremity DVT (2018).  Continue anticoagulation with apixaban.   DVT prophylaxis:apixaban Code Status:full Family Communication:no family at the bedside Disposition Plan/ discharge barriers:Pending TEE, for duration of antibiotic therapy. Question patient's ability to use home IV antibiotic therapy.    Body mass index is 43.9 kg/m. Malnutrition Type:  Nutrition Problem: Inadequate oral intake Etiology: inability to eat   Malnutrition Characteristics:  Signs/Symptoms: NPO status   Nutrition Interventions:  Interventions: MVI, Tube feeding, Prostat  RN Pressure Injury Documentation:     Consultants:   ID   Procedures:     Antimicrobials:   Daptomycin.     Subjective: Patient with dyspnea, moderate in intensity, worse with exertion, and associated with oxygen desaturation. Placed on supplemental 02 per [place].   Objective: Vitals:   08/17/18 1608 08/17/18 2314 08/18/18 0348 08/18/18 0733  BP: (!) 125/48 (!) 141/85  137/65  Pulse: 65 69  64  Resp: 14     Temp: 98.9 F (37.2 C) 98.5 F (36.9 C)  98.4 F (36.9 C)  TempSrc: Oral Oral    SpO2: 98% 99%  91%  Weight:   105.4 kg   Height:        Intake/Output Summary (Last 24 hours) at 08/18/2018 1254 Last data filed at 08/18/2018  0600 Gross per 24 hour  Intake -  Output 125 ml  Net -125 ml   Filed Weights    08/16/18 0500 08/17/18 0600 08/18/18 0348  Weight: 104 kg 105.7 kg 105.4 kg    Examination:   General: deconditioned  Neurology: Awake and alert, non focal  E ENT: no pallor, no icterus, oral mucosa moist Cardiovascular: No JVD. S1-S2 present, rhythmic, no gallops, rubs, or murmurs. No lower extremity edema. Pulmonary: positive breath sounds bilaterally, positive bilateral expiratory wheezing, with no rhonchi or rales. Gastrointestinal. Abdomen with no organomegaly, non tender, no rebound or guarding Skin. No rashes Musculoskeletal: no joint deformities     Data Reviewed: I have personally reviewed following labs and imaging studies  CBC: Recent Labs  Lab 08/13/18 0347 08/14/18 1051 08/15/18 0352 08/17/18 0601 08/18/18 0615  WBC 7.2 7.7 5.5 4.8 4.9  HGB 13.2 12.0 11.8* 11.2* 12.5  HCT 42.1 38.9 40.1 37.7 42.2  MCV 90.3 91.7 95.2 95.2 95.3  PLT 94* 76* 75* 109* 89*   Basic Metabolic Panel: Recent Labs  Lab 08/12/18 1643 08/13/18 0347 08/13/18 1725 08/14/18 0400 08/15/18 0352 08/16/18 0917 08/17/18 0714  NA  --  138  --  140 141 143 140  K  --  3.9  --  3.7 3.6 4.5 4.5  CL  --  92*  --  96* 96* 98 98  CO2  --  36*  --  35* 34* 35* 34*  GLUCOSE  --  137*  --  128* 104* 94 87  BUN  --  38*  --  50* 50* 46* 44*  CREATININE  --  3.16*  --  3.20* 3.08* 3.02* 2.89*  CALCIUM  --  9.3  --  8.9 9.1 9.6 9.6  MG 2.1 2.1 2.2 2.2  --   --   --   PHOS 1.9* 2.6 2.1* 1.9*  --   --   --    GFR: Estimated Creatinine Clearance: 23.1 mL/min (A) (by C-G formula based on SCr of 2.89 mg/dL (H)). Liver Function Tests: Recent Labs  Lab 08/13/18 0347 08/14/18 0400 08/15/18 0352  AST 111* 49* 36  ALT 42 29 23  ALKPHOS 110 88 76  BILITOT 2.3* 1.4* 1.0  PROT 6.3* 6.0* 6.2*  ALBUMIN 3.1* 2.8* 2.9*   No results for input(s): LIPASE, AMYLASE in the last 168 hours. Recent Labs  Lab 08/13/18 0347  AMMONIA 19   Coagulation Profile: No results for input(s): INR, PROTIME in the  last 168 hours. Cardiac Enzymes: Recent Labs  Lab 08/15/18 0352  CKTOTAL 74   BNP (last 3 results) No results for input(s): PROBNP in the last 8760 hours. HbA1C: No results for input(s): HGBA1C in the last 72 hours. CBG: Recent Labs  Lab 08/17/18 1944 08/17/18 2320 08/18/18 0313 08/18/18 0733 08/18/18 1125  GLUCAP 130* 90 82 74 124*   Lipid Profile: No results for input(s): CHOL, HDL, LDLCALC, TRIG, CHOLHDL, LDLDIRECT in the last 72 hours. Thyroid Function Tests: No results for input(s): TSH, T4TOTAL, FREET4, T3FREE, THYROIDAB in the last 72 hours. Anemia Panel: No results for input(s): VITAMINB12, FOLATE, FERRITIN, TIBC, IRON, RETICCTPCT in the last 72 hours.    Radiology Studies: I have reviewed all of the imaging during this hospital visit personally     Scheduled Meds: . apixaban  5 mg Oral BID  . Chlorhexidine Gluconate Cloth  6 each Topical Daily  . ipratropium-albuterol  3 mL Nebulization Q6H  . mouth rinse  15 mL  Mouth Rinse BID  . mometasone-formoterol  2 puff Inhalation BID  . mupirocin ointment  1 application Nasal BID   Continuous Infusions: . DAPTOmycin (CUBICIN)  IV 800 mg (08/16/18 2040)     LOS: 6 days        Cong Hightower Gerome Apley, MD

## 2018-08-19 ENCOUNTER — Inpatient Hospital Stay (HOSPITAL_COMMUNITY): Payer: Medicaid Other

## 2018-08-19 DIAGNOSIS — J45901 Unspecified asthma with (acute) exacerbation: Secondary | ICD-10-CM

## 2018-08-19 LAB — BASIC METABOLIC PANEL
Anion gap: 9 (ref 5–15)
BUN: 40 mg/dL — ABNORMAL HIGH (ref 6–20)
CO2: 31 mmol/L (ref 22–32)
Calcium: 9.4 mg/dL (ref 8.9–10.3)
Chloride: 102 mmol/L (ref 98–111)
Creatinine, Ser: 2.71 mg/dL — ABNORMAL HIGH (ref 0.44–1.00)
GFR calc Af Amer: 21 mL/min — ABNORMAL LOW (ref >60)
GFR calc non Af Amer: 18 mL/min — ABNORMAL LOW (ref >60)
Glucose, Bld: 90 mg/dL (ref 70–99)
Potassium: 5 mmol/L (ref 3.5–5.1)
Sodium: 142 mmol/L (ref 135–145)

## 2018-08-19 LAB — GLUCOSE, CAPILLARY
Glucose-Capillary: 85 mg/dL (ref 70–99)
Glucose-Capillary: 92 mg/dL (ref 70–99)
Glucose-Capillary: 92 mg/dL (ref 70–99)
Glucose-Capillary: 89 mg/dL (ref 70–99)
Glucose-Capillary: 100 mg/dL — ABNORMAL HIGH (ref 70–99)
Glucose-Capillary: 90 mg/dL (ref 70–99)

## 2018-08-19 LAB — CBC
HCT: 38.9 % (ref 36.0–46.0)
Hemoglobin: 11.5 g/dL — ABNORMAL LOW (ref 12.0–15.0)
MCH: 28 pg (ref 26.0–34.0)
MCHC: 29.6 g/dL — ABNORMAL LOW (ref 30.0–36.0)
MCV: 94.9 fL (ref 80.0–100.0)
Platelets: 85 10*3/uL — ABNORMAL LOW (ref 150–400)
RBC: 4.1 MIL/uL (ref 3.87–5.11)
RDW: 17.2 % — ABNORMAL HIGH (ref 11.5–15.5)
WBC: 4.6 10*3/uL (ref 4.0–10.5)
nRBC: 0 % (ref 0.0–0.2)

## 2018-08-19 NOTE — Progress Notes (Addendum)
PROGRESS NOTE    Kelsey Leonard  ZOX:[id number] DOB: 04/25/1955 DOA: 08/12/2018 PCP: System, Pcp Not In    Brief Narrative:  63 year old who presented with altered mental status. Patient had several weeks of dyspnea, lower extremity edema, and weight gain. Was admitted to [facility] with acute hypoxic respiratory failure 07-20. He was diagnosed with staph aureusbacteremia. She had rapid worsening of her mentation with worsening hypoxic and hypercapnic respiratory failure that required intubation and mechanical ventilation. She was started onvasopressors (July 25) and transferred to [facility] further management. AT the time of her transferherblood pressure was 126/102, pulse rate 66, temperature 98.8, respiratory rate 18, oxygen saturation 98% on assist control mechanical ventilation. Sodium 138, potassium 3.9, chloride 92, bicarb 36, glucose 137, BUN 38, creatinine 3.1,white count 7.2, hemoglobin 13.2, hematocrit 42.1, platelets 94.Her chest radiograph had a right base atelectasis, mild cardiomegaly, right subclavian central venous catheter, nasogastric tube with tip below the diaphragm. Endotracheal tube above the carina.  Patientwas admitted to the hospital with a working diagnosis of acute on chronic hypoxic, hypercapnic in the setting of acute metabolic encephalopathy.\  Patient's mentation and hemodynamics improved, received antibiotic therapy with vancomycin and now with daptomycin. She was successfully liberated from mechanical ventilation July 27.  Transferred to [facility] July 29.  Patient now pending TEE for further work up for bacteremia.  Assessment & Plan:   Principal Problem:   Bacteremia due to Staphylococcus aureus Active Problems:   Respiratory failure (HCC)   CKD (chronic kidney disease), stage IV (HCC)   Asthma   AKI (acute kidney injury) ([place])   Acute metabolic encephalopathy   1. Acute on chronic hypoxic and hypercapnic respiratory failure/  COPD exacerbation.Patient with positive desaturation on room air at reset (88%) and on ambulation with supplemental 02 (85% on 3 LPM), Follow chest film personally reviewed noted right base atelectasis and enlarged pulmonary vasculature. No wheezing today on auscultation. Will continue with albuterol-ipratropium, inhaled corticosteroid and long acting b agonist. Hold on systemic steroids for now. Add incentive spirometer.   2. Metabolic encephalopathy.Clinically resolved, no confusion or agitation.   3. MRSA bacteremia.Last blood culture from 07/25 only one bottle positive for coagulase negative staph,(likely a contamination).Pending TEE to define duration of antibiotic therapy.   4. AKI on CKD stage IVwith metabolic alkalosis. Renal function has continue to improve, serum cr today down to 2,7 with K at 5,0 and serum bicarbonate at 31.   5. Obesity.her calculated BMIis43.3  6. Left lower extremity DVT (2018).On apixaban for anticoagulation.   DVT prophylaxis:apixaban Code Status:full Family Communication:no family at the bedside Disposition Plan/ discharge barriers:Pending TEE, for duration of antibiotic therapy. Question patient's ability to use home IV antibiotic therapy.    Body mass index is 43.9 kg/m. Malnutrition Type:  Nutrition Problem: Inadequate oral intake Etiology: inability to eat   Malnutrition Characteristics:  Signs/Symptoms: NPO status   Nutrition Interventions:  Interventions: MVI, Tube feeding, Prostat  RN Pressure Injury Documentation:     Consultants:   Cardiology for TEE  ID  Procedures:     Antimicrobials:   daptomycin     Subjective: Patient with improved dyspnea, no chest pain, no nausea or vomiting. Patient with positive hx of smoking about 1 pack per day for 10 years. Not on oxygen at home.   Objective: Vitals:   08/18/18 1944 08/18/18 1956 08/19/18 0809 08/19/18 0819  BP:  133/61  (!) 144/82  Pulse: 60  (!) 59  (!) 55  Resp: 20 20  16   Temp:  98.5 F (36.9 C)  98.6 F (37 C)  TempSrc:  Oral  Oral  SpO2: 98% 95% 98% 99%  Weight:      Height:        Intake/Output Summary (Last 24 hours) at 08/19/2018 0831 Last data filed at 08/19/2018 0820 Gross per 24 hour  Intake -  Output 500 ml  Net -500 ml   Filed Weights   08/16/18 0500 08/17/18 0600 08/18/18 0348  Weight: 104 kg 105.7 kg 105.4 kg    Examination:   General: Not in pain or dyspnea. Deconditioned  Neurology: Awake and alert, non focal  E ENT: mild pallor, no icterus, oral mucosa moist Cardiovascular: No JVD. S1-S2 present, rhythmic, no gallops, rubs, or murmurs. ++ non pitting lower extremity edema. Pulmonary: positive breath sounds bilaterally, with no wheezing, rhonchi or rales. Gastrointestinal. Abdomen with no organomegaly, non tender, no rebound or guarding Skin. No rashes Musculoskeletal: no joint deformities     Data Reviewed: I have personally reviewed following labs and imaging studies  CBC: Recent Labs  Lab 08/14/18 1051 08/15/18 0352 08/17/18 0601 08/18/18 0615 08/19/18 0518  WBC 7.7 5.5 4.8 4.9 4.6  HGB 12.0 11.8* 11.2* 12.5 11.5*  HCT 38.9 40.1 37.7 42.2 38.9  MCV 91.7 95.2 95.2 95.3 94.9  PLT 76* 75* 109* 89* 85*   Basic Metabolic Panel: Recent Labs  Lab 08/12/18 1643  08/13/18 0347 08/13/18 1725 08/14/18 0400 08/15/18 0352 08/16/18 0917 08/17/18 0714 08/19/18 0518  NA  --   --  138  --  140 141 143 140 142  K  --   --  3.9  --  3.7 3.6 4.5 4.5 5.0  CL  --    < > 92*  --  96* 96* 98 98 102  CO2  --    < > 36*  --  35* 34* 35* 34* 31  GLUCOSE  --    < > 137*  --  128* 104* 94 87 90  BUN  --    < > 38*  --  50* 50* 46* 44* 40*  CREATININE  --    < > 3.16*  --  3.20* 3.08* 3.02* 2.89* 2.71*  CALCIUM  --    < > 9.3  --  8.9 9.1 9.6 9.6 9.4  MG 2.1  --  2.1 2.2 2.2  --   --   --   --   PHOS 1.9*  --  2.6 2.1* 1.9*  --   --   --   --    < > = values in this interval not displayed.    GFR: Estimated Creatinine Clearance: 24.7 mL/min (A) (by C-G formula based on SCr of 2.71 mg/dL (H)). Liver Function Tests: Recent Labs  Lab 08/13/18 0347 08/14/18 0400 08/15/18 0352  AST 111* 49* 36  ALT 42 29 23  ALKPHOS 110 88 76  BILITOT 2.3* 1.4* 1.0  PROT 6.3* 6.0* 6.2*  ALBUMIN 3.1* 2.8* 2.9*   No results for input(s): LIPASE, AMYLASE in the last 168 hours. Recent Labs  Lab 08/13/18 0347  AMMONIA 19   Coagulation Profile: No results for input(s): INR, PROTIME in the last 168 hours. Cardiac Enzymes: Recent Labs  Lab 08/15/18 0352  CKTOTAL 74   BNP (last 3 results) No results for input(s): PROBNP in the last 8760 hours. HbA1C: No results for input(s): HGBA1C in the last 72 hours. CBG: Recent Labs  Lab 08/18/18 1633 08/18/18 1915 08/18/18 2258 08/19/18 0333 08/19/18  [place] 85 92   Lipid Profile: No results for input(s): CHOL, HDL, LDLCALC, TRIG, CHOLHDL, LDLDIRECT in the last 72 hours. Thyroid Function Tests: No results for input(s): TSH, T4TOTAL, FREET4, T3FREE, THYROIDAB in the last 72 hours. Anemia Panel: No results for input(s): VITAMINB12, FOLATE, FERRITIN, TIBC, IRON, RETICCTPCT in the last 72 hours.    Radiology Studies: I have reviewed all of the imaging during this hospital visit personally     Scheduled Meds: . apixaban  5 mg Oral BID  . Chlorhexidine Gluconate Cloth  6 each Topical Daily  . ipratropium-albuterol  3 mL Nebulization BID  . mouth rinse  15 mL Mouth Rinse BID  . mometasone-formoterol  2 puff Inhalation BID  . mupirocin ointment  1 application Nasal BID   Continuous Infusions: . DAPTOmycin (CUBICIN)  IV 800 mg (08/18/18 2209)     LOS: 7 days        Zaleah Ternes Gerome Apley, MD

## 2018-08-19 NOTE — Plan of Care (Signed)
  Problem: Education: Goal: Ability to verbalize understanding of medication therapies will improve Outcome: Progressing   Problem: Activity: Goal: Capacity to carry out activities will improve Outcome: Progressing   

## 2018-08-20 DIAGNOSIS — J9600 Acute respiratory failure, unspecified whether with hypoxia or hypercapnia: Secondary | ICD-10-CM

## 2018-08-20 LAB — CBC
HCT: 37.1 % (ref 36.0–46.0)
Hemoglobin: 11.3 g/dL — ABNORMAL LOW (ref 12.0–15.0)
MCH: 28.5 pg (ref 26.0–34.0)
MCHC: 30.5 g/dL (ref 30.0–36.0)
MCV: 93.5 fL (ref 80.0–100.0)
Platelets: 92 10*3/uL — ABNORMAL LOW (ref 150–400)
RBC: 3.97 MIL/uL (ref 3.87–5.11)
RDW: 17.2 % — ABNORMAL HIGH (ref 11.5–15.5)
WBC: 4.6 10*3/uL (ref 4.0–10.5)
nRBC: 0 % (ref 0.0–0.2)

## 2018-08-20 LAB — GLUCOSE, CAPILLARY
Glucose-Capillary: 80 mg/dL (ref 70–99)
Glucose-Capillary: 39 mg/dL — CL (ref 70–99)
Glucose-Capillary: 91 mg/dL (ref 70–99)
Glucose-Capillary: 113 mg/dL — ABNORMAL HIGH (ref 70–99)
Glucose-Capillary: 99 mg/dL (ref 70–99)
Glucose-Capillary: 106 mg/dL — ABNORMAL HIGH (ref 70–99)
Glucose-Capillary: 119 mg/dL — ABNORMAL HIGH (ref 70–99)

## 2018-08-20 NOTE — Progress Notes (Signed)
Urine cloudy in purewick cannister, but no gross blood visible. Patient shifted in bed and caused a leak in her purewick, soiling her bed pad. She also had a small incontinent episode with her bowels. A maroon ring was noted on her bed pad. Hemoccult was negative on the stool. Reported to Dr. Cathlean Sauer via epic messaging. Patient placed up out of bed to chair. Will continue to monitor.

## 2018-08-20 NOTE — Progress Notes (Signed)
Pharmacy Antibiotic Note  Kelsey Leonard is a 63 y.o. female admitted on 08/12/2018 with MRSA bacteremia.  Pharmacy has been consulted for Daptomycin dosing.  She is on Day #14 of antibiotics for MRSA bacteremia, currently on Daptomycin. Her daptomycin regimen has been adjusted for renal dysfunction. TEE pending to determine duration of therapy.  Plan: Continue Daptomycin 8 mg/kg IV q48h CK q Tues Check SCr on Tuesday    Height: 5\' 1"  (154.9 cm) Weight: 225 lb 1.4 oz (102.1 kg) IBW/kg (Calculated) : 47.8  Temp (24hrs), Avg:98.6 F (37 C), Min:98.2 F (36.8 C), Max:98.9 F (37.2 C)  Recent Labs  Lab 08/14/18 0400  08/15/18 0352 08/16/18 0917 08/17/18 0601 08/17/18 0714 08/18/18 0615 08/19/18 0518 08/20/18 0711  WBC  --    < > 5.5  --  4.8  --  4.9 4.6 4.6  CREATININE 3.20*  --  3.08* 3.02*  --  2.89*  --  2.71*  --   VANCORANDOM 27  --   --   --   --   --   --   --   --    < > = values in this interval not displayed.    Estimated Creatinine Clearance: 24.2 mL/min (A) (by C-G formula based on SCr of 2.71 mg/dL (H)).    Allergies  Allergen Reactions  . Aspirin Nausea And Vomiting  . Penicillins Hives    Has patient had a PCN reaction causing immediate rash, facial/tongue/throat swelling, SOB or lightheadedness with hypotension: Yes Has patient had a PCN reaction causing severe rash involving mucus membranes or skin necrosis: Yes Has patient had a PCN reaction that required hospitalization: Yes Has patient had a PCN reaction occurring within the last 10 years: No If all of the above answers are "NO", then may proceed with Cephalosporin use.   . Shellfish Allergy Other (See Comments)  . Sulfa Antibiotics Rash    Legrand Como, Pharm.D., BCPS, BCIDP Clinical Pharmacist Phone: [phone] Please check AMION for all [place] numbers 08/20/2018, 10:48 AM

## 2018-08-20 NOTE — Progress Notes (Addendum)
PROGRESS NOTE    Kelsey Leonard  YFV:[id number] DOB: November 10, 1955 DOA: 08/12/2018 PCP: System, Pcp Not In    Brief Narrative:  63 year old who presented with altered mental status. Patient had several weeks of dyspnea, lower extremity edema, and weight gain. Was admitted to [facility] with acute hypoxic respiratory failure 07-20. He was diagnosed with staph aureusbacteremia. She had rapid worsening of her mentation with worsening hypoxic and hypercapnic respiratory failure that required intubation and mechanical ventilation. She wasstarted onvasopressors (July 25) and transferred to [facility] further management. AT the time of her transferherblood pressure was 126/102, pulse rate 66, temperature 98.8, respiratory rate 18, oxygen saturation 98% on assist control mechanical ventilation. Sodium 138, potassium 3.9, chloride 92, bicarb 36, glucose 137, BUN 38, creatinine 3.1,white count 7.2, hemoglobin 13.2, hematocrit 42.1, platelets 94.Her chest radiograph had a right base atelectasis, mild cardiomegaly, right subclavian central venous catheter, nasogastric tube with tip below the diaphragm. Endotracheal tube above the carina.  Patientwas admitted to the hospital with a working diagnosis of acute on chronic hypoxic, hypercapnic in the setting of acute metabolic encephalopathy.\  Patient's mentation and hemodynamics improved, received antibiotic therapy with vancomycin and now with daptomycin. She was successfully liberated from mechanical ventilation July 27.  Transferred to [facility] July 29.  Patient now pending TEE for further work up for bacteremia.    Assessment & Plan:   Principal Problem:   Bacteremia due to Staphylococcus aureus Active Problems:   Respiratory failure (HCC)   CKD (chronic kidney disease), stage IV (HCC)   Asthma   AKI (acute kidney injury) ([place])   Acute metabolic encephalopathy   1. Acute on chronic hypoxic and hypercapnic respiratory  failure/ COPD exacerbation.Patient with positive desaturation on room air at reset (88%) and on ambulation with supplemental 02 (85% on 3 LPM). Her dyspnea continue to improve, continue bronchodilator therapy, inhaled corticosteroids and supplemental 02 per [place]. Will need home 02 at discharge.   2. Metabolic encephalopathy.Resolved.  3. MRSA bacteremia.Last blood culture from 07/25 only one bottle positive for coagulase negative staph,(likely a contamination).Waiting TEE for further antibiotic duration recommendations. Continue with IV daptomycin.   4. AKI on CKD stage IVwith metabolic alkalosis. Clinically resolved, patient is tolerating po well.   5. Obesity. BMIis43.3  6. Left lower extremity DVT (2018).Continue with apixaban for anticoagulation.   DVT prophylaxis:apixaban Code Status:full Family Communication:no family at the bedside Disposition Plan/ discharge barriers:Pending TEE, for duration of antibiotic therapy. Question patient's ability to use home IV antibiotic therapy.    Body mass index is 42.53 kg/m. Malnutrition Type:  Nutrition Problem: Inadequate oral intake Etiology: inability to eat   Malnutrition Characteristics:  Signs/Symptoms: NPO status   Nutrition Interventions:  Interventions: MVI, Tube feeding, Prostat  RN Pressure Injury Documentation:     Consultants:   Cardiology for TEE  ID  Procedures:     Antimicrobials:       Subjective: Dyspnea continue to improve, not yet back to baseline, no nausea or vomiting, no chest pain. Has been out of bed.   Objective: Vitals:   08/19/18 2333 08/20/18 0500 08/20/18 0813 08/20/18 0856  BP: 138/65  (!) 110/48   Pulse: (!) 57  78   Resp:   12   Temp: 98.2 F (36.8 C)  98.9 F (37.2 C)   TempSrc: Oral  Oral   SpO2: 96%  100% 98%  Weight:  102.1 kg    Height:        Intake/Output Summary (Last 24 hours) at 08/20/2018  1020 Last data filed at 08/20/2018 0819 Gross per  24 hour  Intake -  Output 900 ml  Net -900 ml   Filed Weights   08/17/18 0600 08/18/18 0348 08/20/18 0500  Weight: 105.7 kg 105.4 kg 102.1 kg    Examination:   General: deconditioned  Neurology: Awake and alert, non focal  E ENT: no pallor, no icterus, oral mucosa moist Cardiovascular: No JVD. S1-S2 present, rhythmic, no gallops, rubs, or murmurs. Trace non pitting lower extremity edema. Pulmonary: positive breath sounds bilaterally, decreased air movement, no wheezing, rhonchi or rales. Gastrointestinal. Abdomen with no organomegaly, non tender, no rebound or guarding Skin. No rashes Musculoskeletal: no joint deformities     Data Reviewed: I have personally reviewed following labs and imaging studies  CBC: Recent Labs  Lab 08/15/18 0352 08/17/18 0601 08/18/18 0615 08/19/18 0518 08/20/18 0711  WBC 5.5 4.8 4.9 4.6 4.6  HGB 11.8* 11.2* 12.5 11.5* 11.3*  HCT 40.1 37.7 42.2 38.9 37.1  MCV 95.2 95.2 95.3 94.9 93.5  PLT 75* 109* 89* 85* 92*   Basic Metabolic Panel: Recent Labs  Lab 08/13/18 1725 08/14/18 0400 08/15/18 0352 08/16/18 0917 08/17/18 0714 08/19/18 0518  NA  --  140 141 143 140 142  K  --  3.7 3.6 4.5 4.5 5.0  CL  --  96* 96* 98 98 102  CO2  --  35* 34* 35* 34* 31  GLUCOSE  --  128* 104* 94 87 90  BUN  --  50* 50* 46* 44* 40*  CREATININE  --  3.20* 3.08* 3.02* 2.89* 2.71*  CALCIUM  --  8.9 9.1 9.6 9.6 9.4  MG 2.2 2.2  --   --   --   --   PHOS 2.1* 1.9*  --   --   --   --    GFR: Estimated Creatinine Clearance: 24.2 mL/min (A) (by C-G formula based on SCr of 2.71 mg/dL (H)). Liver Function Tests: Recent Labs  Lab 08/14/18 0400 08/15/18 0352  AST 49* 36  ALT 29 23  ALKPHOS 88 76  BILITOT 1.4* 1.0  PROT 6.0* 6.2*  ALBUMIN 2.8* 2.9*   No results for input(s): LIPASE, AMYLASE in the last 168 hours. No results for input(s): AMMONIA in the last 168 hours. Coagulation Profile: No results for input(s): INR, PROTIME in the last 168 hours.  Cardiac Enzymes: Recent Labs  Lab 08/15/18 0352  CKTOTAL 74   BNP (last 3 results) No results for input(s): PROBNP in the last 8760 hours. HbA1C: No results for input(s): HGBA1C in the last 72 hours. CBG: Recent Labs  Lab 08/19/18 2329 08/20/18 0357 08/20/18 0511 08/20/18 0523 08/20/18 0821  GLUCAP 90 80 39* 91 113*   Lipid Profile: No results for input(s): CHOL, HDL, LDLCALC, TRIG, CHOLHDL, LDLDIRECT in the last 72 hours. Thyroid Function Tests: No results for input(s): TSH, T4TOTAL, FREET4, T3FREE, THYROIDAB in the last 72 hours. Anemia Panel: No results for input(s): VITAMINB12, FOLATE, FERRITIN, TIBC, IRON, RETICCTPCT in the last 72 hours.    Radiology Studies: I have reviewed all of the imaging during this hospital visit personally     Scheduled Meds: . apixaban  5 mg Oral BID  . Chlorhexidine Gluconate Cloth  6 each Topical Daily  . ipratropium-albuterol  3 mL Nebulization BID  . mouth rinse  15 mL Mouth Rinse BID  . mometasone-formoterol  2 puff Inhalation BID  . mupirocin ointment  1 application Nasal BID   Continuous Infusions: . DAPTOmycin (  CUBICIN)  IV 800 mg (08/18/18 2209)     LOS: 8 days        Maeli Spacek Gerome Apley, MD

## 2018-08-21 LAB — GLUCOSE, CAPILLARY
Glucose-Capillary: 100 mg/dL — ABNORMAL HIGH (ref 70–99)
Glucose-Capillary: 101 mg/dL — ABNORMAL HIGH (ref 70–99)
Glucose-Capillary: 117 mg/dL — ABNORMAL HIGH (ref 70–99)
Glucose-Capillary: 86 mg/dL (ref 70–99)
Glucose-Capillary: 97 mg/dL (ref 70–99)

## 2018-08-21 LAB — CBC
HCT: 38.5 % (ref 36.0–46.0)
Hemoglobin: 11.4 g/dL — ABNORMAL LOW (ref 12.0–15.0)
MCH: 27.7 pg (ref 26.0–34.0)
MCHC: 29.6 g/dL — ABNORMAL LOW (ref 30.0–36.0)
MCV: 93.7 fL (ref 80.0–100.0)
Platelets: 95 10*3/uL — ABNORMAL LOW (ref 150–400)
RBC: 4.11 MIL/uL (ref 3.87–5.11)
RDW: 17.2 % — ABNORMAL HIGH (ref 11.5–15.5)
WBC: 4.6 10*3/uL (ref 4.0–10.5)
nRBC: 0 % (ref 0.0–0.2)

## 2018-08-21 NOTE — Progress Notes (Signed)
Occupational Therapy Treatment Patient Details Name: Kelsey Leonard MRN: [id number] DOB: 11-28-1955 Today's Date: 08/21/2018    History of present illness Pt adm from [facility] with acute on chronic respiratory failure and MRSA bacteremia. Intubated 7/25-7/27. PMH - presumed Sarcoidosis (never biopsied), HTN, Asthma, Lt leg DVT May 2018, CKD 4, Schizophrenia, Depression, Hypothyroidism   OT comments  Pt limited this session secondary to HR and O2 saturation. Pt required minguard for bed mobility, minA to stand at EOB and total A for posterior pericare. Deferred mobility secondary to increase HR;HR 60s at rest, unsure reading with sitting EOB;with standing HR 230s reading on pulse oximeter. Pt returned to sitting HR 51bpm;pt returned to supine HR remained in 50s;RN notified of pt's vitals. Pt on 2lnc SpO2 95% at rest and 88% with functional mobility. Based on today's session pt may benefit from sub-acute therapy at SNF, pending progress pt may continue to benefit from [facility] with 24/7 supervision/physical assistance. Will continue to follow acutely.   Follow Up Recommendations  Home health OT;Supervision/Assistance - 24 hour;SNF    Equipment Recommendations  3 in 1 bedside commode    Recommendations for Other Services      Precautions / Restrictions Precautions Precautions: Fall Precaution Comments: watch O2 Sats Restrictions Weight Bearing Restrictions: No       Mobility Bed Mobility Overal bed mobility: Needs Assistance Bed Mobility: Supine to Sit;Sit to Supine     Supine to sit: Min guard Sit to supine: Min guard      Transfers Overall transfer level: Needs assistance Equipment used: Rolling walker (2 wheeled);1 person hand held assist Transfers: Sit to/from Stand Sit to Stand: Min assist         General transfer comment: mina to powerup into standing    Balance Overall balance assessment: Needs assistance Sitting-balance support: No upper extremity  supported Sitting balance-Leahy Scale: Good     Standing balance support: Bilateral upper extremity supported Standing balance-Leahy Scale: Fair Standing balance comment: walker and minguard for static standing                           ADL either performed or assessed with clinical judgement   ADL Overall ADL's : Needs assistance/impaired Eating/Feeding: NPO   Grooming: Wash/dry face;Set up;Sitting           Upper Body Dressing : Set up;Sitting Upper Body Dressing Details (indicate cue type and reason): donned clean gown         Toileting- Clothing Manipulation and Hygiene: Minimal assistance;Sit to/from stand Toileting - Clothing Manipulation Details (indicate cue type and reason): sit<>stand from bed;totalA for posterior pericare       General ADL Comments: minA for powerup from surface, deferred mobility secondary to increase HR;HR 60s at rest, unsure reading with sitting EOB;with standing HR 230s reading on pulse oximeter. pt returned to sitting HR 51bpm;pt returned to supine HR remained in 50s;RN notified of pt's vitals     Vision       Perception     Praxis      Cognition Arousal/Alertness: Awake/alert Behavior During Therapy: WFL for tasks assessed/performed Overall Cognitive Status: History of cognitive impairments - at baseline                                 General Comments: suspect baseline delay        Exercises     Shoulder Instructions  General Comments Pt on 2lnc, spO2 88%-95% throughout session, dropped with functional mobility;HR 50s with spike to 230bpm;RN notified    Pertinent Vitals/ Pain       Pain Assessment: No/denies pain  Home Living                                          Prior Functioning/Environment              Frequency  Min 2X/week        Progress Toward Goals  OT Goals(current goals can now be found in the care plan section)  Progress towards OT goals:  Not progressing toward goals - comment(limited by HR and O2)  Acute Rehab OT Goals Patient Stated Goal: get stronger OT Goal Formulation: With patient Time For Goal Achievement: 08/30/18 Potential to Achieve Goals: Good ADL Goals Pt Will Perform Lower Body Bathing: with modified independence;sit to/from stand Pt Will Perform Lower Body Dressing: with modified independence;sit to/from stand Pt Will Transfer to Toilet: with modified independence;ambulating;bedside commode Pt Will Perform Toileting - Clothing Manipulation and hygiene: with modified independence Additional ADL Goal #1: Pt will complete ADL task at sink level with SpO2 sats remaining greater than 88 on RA.  Plan Discharge plan needs to be updated    Co-evaluation                 AM-PAC OT "6 Clicks" Daily Activity     Outcome Measure   Help from another person eating meals?: A Little Help from another person taking care of personal grooming?: A Little Help from another person toileting, which includes using toliet, bedpan, or urinal?: A Little Help from another person bathing (including washing, rinsing, drying)?: A Little Help from another person to put on and taking off regular upper body clothing?: A Little Help from another person to put on and taking off regular lower body clothing?: A Little 6 Click Score: 18    End of Session Equipment Utilized During Treatment: Gait belt;Rolling walker;Oxygen  OT Visit Diagnosis: Unsteadiness on feet (R26.81);Muscle weakness (generalized) (M62.81)   Activity Tolerance Treatment limited secondary to medical complications (Comment)(SpO2 and HR)   Patient Left in bed;with call bell/phone within reach;with bed alarm set   Nurse Communication Mobility status(pt's vitals)        Time: 4081-4481 OT Time Calculation (min): 26 min  Charges: OT General Charges $OT Visit: 1 Visit OT Treatments $Self Care/Home Management : 23-37 mins  [place] Office: [place] 08/21/2018, 1:53 PM

## 2018-08-21 NOTE — Progress Notes (Signed)
Subjective: No new complaints   Antibiotics:  Anti-infectives (From admission, onward)   Start     Dose/Rate Route Frequency Ordered Stop   08/16/18 2000  DAPTOmycin (CUBICIN) 817 mg in sodium chloride 0.9 % IVPB  Status:  Discontinued     8 mg/kg  102.1 kg 232.7 mL/hr over 30 Minutes Intravenous Every 48 hours 08/14/18 1027 08/16/18 0744   08/16/18 2000  DAPTOmycin (CUBICIN) 800 mg in sodium chloride 0.9 % IVPB     800 mg 232 mL/hr over 30 Minutes Intravenous Every 48 hours 08/16/18 0744     08/15/18 0800  DAPTOmycin (CUBICIN) 817 mg in sodium chloride 0.9 % IVPB     8 mg/kg  102.1 kg 232.7 mL/hr over 30 Minutes Intravenous Daily 08/14/18 1027 08/15/18 0822   08/12/18 1709  vancomycin variable dose per unstable renal function (pharmacist dosing)  Status:  Discontinued      Does not apply See admin instructions 08/12/18 1717 08/13/18 1332      Medications: Scheduled Meds: . apixaban  5 mg Oral BID  . Chlorhexidine Gluconate Cloth  6 each Topical Daily  . ipratropium-albuterol  3 mL Nebulization BID  . mouth rinse  15 mL Mouth Rinse BID  . mometasone-formoterol  2 puff Inhalation BID  . mupirocin ointment  1 application Nasal BID   Continuous Infusions: . DAPTOmycin (CUBICIN)  IV 800 mg (08/20/18 2013)   PRN Meds:.acetaminophen, bisacodyl, ipratropium-albuterol, phenol    Objective: Weight change:   Intake/Output Summary (Last 24 hours) at 08/21/2018 1026 Last data filed at 08/21/2018 0012 Gross per 24 hour  Intake 480 ml  Output 700 ml  Net -220 ml   Blood pressure 129/64, pulse (!) 55, temperature 98.5 F (36.9 C), temperature source Oral, resp. rate (!) 30, height 5\' 1"  (1.549 m), weight 102.1 kg, SpO2 97 %. Temp:  [98.5 F (36.9 C)-98.7 F (37.1 C)] 98.5 F (36.9 C) (08/03 0823) Pulse Rate:  [49-59] 55 (08/03 0823) Resp:  [18-30] 30 (08/03 0823) BP: (129-137)/(64-77) 129/64 (08/03 0823) SpO2:  [97 %-100 %] 97 % (08/03 0823)  Physical Exam:  General: Alert and awake, oriented x person and place but slightly confused HEENT: anicteric sclera, EOMI CVS regular rate, normal  Chest: , no wheezing, no respiratory distress Abdomen: soft non-distended,  Extremities: no edema or deformity noted bilaterally Skin: no rashes no DFU Neuro: nonfocal  CBC:    BMET Recent Labs    08/19/18 0518  NA 142  K 5.0  CL 102  CO2 31  GLUCOSE 90  BUN 40*  CREATININE 2.71*  CALCIUM 9.4     Liver Panel  No results for input(s): PROT, ALBUMIN, AST, ALT, ALKPHOS, BILITOT, BILIDIR, IBILI in the last 72 hours.     Sedimentation Rate No results for input(s): ESRSEDRATE in the last 72 hours. C-Reactive Protein No results for input(s): CRP in the last 72 hours.  Micro Results: Recent Results (from the past 720 hour(s))  MRSA PCR Screening     Status: Abnormal   Collection Time: 08/12/18  3:31 PM   Specimen: Nasopharyngeal  Result Value Ref Range Status   MRSA by PCR POSITIVE (A) NEGATIVE Final    Comment:        The GeneXpert MRSA Assay (FDA approved for NASAL specimens only), is one component of a comprehensive MRSA colonization surveillance program. It is not intended to diagnose MRSA infection nor to guide or monitor treatment for MRSA infections. RESULT CALLED  TO, READ BACK BY AND VERIFIED WITH: J.BEABRAUT RN AT [id number] 08/12/2018 BY A.DAVIS Performed at [facility], [place] [address], [place], [place] [address]   Culture, blood (Routine X 2) w Reflex to ID Panel     Status: None   Collection Time: 08/12/18  4:30 PM   Specimen: BLOOD LEFT ARM  Result Value Ref Range Status   Specimen Description BLOOD LEFT ARM  Final   Special Requests   Final    BOTTLES DRAWN AEROBIC ONLY Blood Culture results may not be optimal due to an inadequate volume of blood received in culture bottles   Culture   Final    NO GROWTH 5 DAYS Performed at [facility], [place] [address] [place], [place] [address]    Report Status  08/17/2018 FINAL  Final  Culture, blood (Routine X 2) w Reflex to ID Panel     Status: Abnormal   Collection Time: 08/12/18  4:43 PM   Specimen: BLOOD LEFT HAND  Result Value Ref Range Status   Specimen Description BLOOD LEFT HAND  Final   Special Requests   Final    BOTTLES DRAWN AEROBIC ONLY Blood Culture results may not be optimal due to an inadequate volume of blood received in culture bottles   Culture  Setup Time   Final    AEROBIC BOTTLE ONLY GRAM POSITIVE COCCI CRITICAL RESULT CALLED TO, READ BACK BY AND VERIFIED WITH: Tillman Sers PHARMD [id number] 08/14/18 A BROWNING    Culture (A)  Final    STAPHYLOCOCCUS SPECIES (COAGULASE NEGATIVE) THE SIGNIFICANCE OF ISOLATING THIS ORGANISM FROM A SINGLE SET OF BLOOD CULTURES WHEN MULTIPLE SETS ARE DRAWN IS UNCERTAIN. PLEASE NOTIFY THE MICROBIOLOGY DEPARTMENT WITHIN ONE WEEK IF SPECIATION AND SENSITIVITIES ARE REQUIRED. Performed at [facility], [place] [address], [place], [place] [address]    Report Status 08/16/2018 FINAL  Final  Blood Culture ID Panel (Reflexed)     Status: Abnormal   Collection Time: 08/12/18  4:43 PM  Result Value Ref Range Status   Enterococcus species NOT DETECTED NOT DETECTED Final   Listeria monocytogenes NOT DETECTED NOT DETECTED Final   Staphylococcus species DETECTED (A) NOT DETECTED Final    Comment: Methicillin (oxacillin) susceptible coagulase negative staphylococcus. Possible blood culture contaminant (unless isolated from more than one blood culture draw or clinical case suggests pathogenicity). No antibiotic treatment is indicated for blood  culture contaminants. CRITICAL RESULT CALLED TO, READ BACK BY AND VERIFIED WITH: Tillman Sers PHARMD [id number] 08/14/18 A BROWNING    Staphylococcus aureus (BCID) NOT DETECTED NOT DETECTED Final   Methicillin resistance NOT DETECTED NOT DETECTED Final   Streptococcus species NOT DETECTED NOT DETECTED Final   Streptococcus agalactiae NOT DETECTED NOT DETECTED Final   Streptococcus  pneumoniae NOT DETECTED NOT DETECTED Final   Streptococcus pyogenes NOT DETECTED NOT DETECTED Final   Acinetobacter baumannii NOT DETECTED NOT DETECTED Final   Enterobacteriaceae species NOT DETECTED NOT DETECTED Final   Enterobacter cloacae complex NOT DETECTED NOT DETECTED Final   Escherichia coli NOT DETECTED NOT DETECTED Final   Klebsiella oxytoca NOT DETECTED NOT DETECTED Final   Klebsiella pneumoniae NOT DETECTED NOT DETECTED Final   Proteus species NOT DETECTED NOT DETECTED Final   Serratia marcescens NOT DETECTED NOT DETECTED Final   Haemophilus influenzae NOT DETECTED NOT DETECTED Final   Neisseria meningitidis NOT DETECTED NOT DETECTED Final   Pseudomonas aeruginosa NOT DETECTED NOT DETECTED Final   Candida albicans NOT DETECTED NOT DETECTED Final  Candida glabrata NOT DETECTED NOT DETECTED Final   Candida krusei NOT DETECTED NOT DETECTED Final   Candida parapsilosis NOT DETECTED NOT DETECTED Final   Candida tropicalis NOT DETECTED NOT DETECTED Final    Comment: Performed at [facility], [place] [address], [place],  [place] [address]  Culture, respiratory (non-expectorated)     Status: None   Collection Time: 08/12/18 11:47 PM   Specimen: Endotracheal; Respiratory  Result Value Ref Range Status   Specimen Description ENDOTRACHEAL  Final   Special Requests NONE  Final   Gram Stain   Final    ABUNDANT WBC PRESENT,BOTH PMN AND MONONUCLEAR FEW GRAM POSITIVE COCCI RARE GRAM NEGATIVE RODS    Culture   Final    Consistent with normal respiratory flora. Performed at [facility], [place] [address], [place], [place] [address]    Report Status 08/15/2018 FINAL  Final    Studies/Results: No results found.    Assessment/Plan:  INTERVAL HISTORY:    Principal Problem:   Bacteremia due to Staphylococcus aureus Active Problems:   Respiratory failure (HCC)   CKD (chronic kidney disease), stage IV (HCC)   Asthma   AKI (acute kidney injury) ([place])   Acute metabolic  encephalopathy    Kelsey Leonard is a 63 y.o. female with  MRSA bacteremia , respiratory failure requiring intubation.  #1       [place] Antimicrobial Management Team Staphylococcus aureus bacteremia   Staphylococcus aureus bacteremia (SAB) is associated with a high rate of complications and mortality.  Specific aspects of clinical management are critical to optimizing the outcome of patients with SAB.  Therefore, the [facility] Health Antimicrobial Management Team [facility]) has initiated an intervention aimed at improving the management of SAB at [facility].  To do so, Infectious Diseases physicians are providing an evidence-based consult for the management of all patients with SAB.     Yes No Comments  Perform follow-up blood cultures (even if the patient is afebrile) to ensure clearance of bacteremia [x]  []   1 over repeat blood cultures is growing a coag negative staph which is likely contaminant) to repeat the blood cultures just for thoroughness sake  Remove vascular catheter and obtain follow-up blood cultures after the removal of the catheter [x]  []   do not place central line to be sure we have cleared her bacteremia  Perform echocardiography to evaluate for endocarditis (transthoracic ECHO is 40-50% sensitive, TEE is > 90% sensitive) [x]  []  Please keep in mind, that neither test can definitively EXCLUDE endocarditis, and that should clinical suspicion remain high for endocarditis the patient should then still be treated with an "endocarditis" duration of therapy = 6 weeks  Consult electrophysiologist to evaluate implanted cardiac device (pacemaker, ICD) []  []    Ensure source control [x]  []  Have all abscesses been drained effectively? Have deep seeded infections (septic joints or osteomyelitis) had appropriate surgical debridement?  Source remains unclear  Investigate for "metastatic" sites of infection [x]  []  Does the patient have ANY symptom or physical exam finding that would suggest a  deeper infection (back or neck pain that may be suggestive of vertebral osteomyelitis or epidural abscess, muscle pain that could be a symptom of pyomyositis)?  Keep in mind that for deep seeded infections MRI imaging with contrast is preferred rather than other often insensitive tests such as plain x-rays, especially early in a patient's presentation.  Change antibiotic therapy to daptomycin []  []  Beta-lactam antibiotics are preferred for MSSA due to higher cure rates.  If on Vancomycin, goal trough should be 15 - 20 mcg/mL  Estimated duration of IV antibiotic therapy:  4-6 weeks []  []  Consult case management for probably prolonged outpatient IV antibiotic therapy      LOS: 9 days   Alcide Evener 08/21/2018, 10:26 AM

## 2018-08-21 NOTE — Progress Notes (Signed)
Pt noted to have fine motor tremors of BUE. Tremors more pronounced when pt is doing something with that extremity. Pt also periodically has nonpurposeful movement of mouth from side to side.

## 2018-08-21 NOTE — Progress Notes (Signed)
Questions for doctor and phone numbers to call to give update on condition and to answer the questions per family and pt. They want to know 1. Why pt is still at the hospital 2. Why is this TEE being done] 3. When is this TEE going to be done 4. Is pt going to be on antibiotics when she goes home 5. If so are they going to go thru her arm or is she going to take them by mouth.  6. When is pt going to be coming home.

## 2018-08-21 NOTE — Progress Notes (Signed)
PROGRESS NOTE    Kelsey Leonard  ZTI:[id number] DOB: [birth year] DOA: 08/12/2018 PCP: [place], [name], MD    Brief Narrative:  63 year old who presented with altered mental status. Patient had several weeks of dyspnea, lower extremity edema, and weight gain. Was admitted to [facility] with acute hypoxic respiratory failure 07-20. He was diagnosed with staph aureusbacteremia. She had rapid worsening of her mentation with worsening hypoxic and hypercapnic respiratory failure that required intubation and mechanical ventilation. She wasstarted onvasopressors (July 25) and transferred to [facility] further management. AT the time of her transferherblood pressure was 126/102, pulse rate 66, temperature 98.8, respiratory rate 18, oxygen saturation 98% on assist control mechanical ventilation. Sodium 138, potassium 3.9, chloride 92, bicarb 36, glucose 137, BUN 38, creatinine 3.1,white count 7.2, hemoglobin 13.2, hematocrit 42.1, platelets 94.Her chest radiograph had a right base atelectasis, mild cardiomegaly, right subclavian central venous catheter, nasogastric tube with tip below the diaphragm. Endotracheal tube above the carina.  Patientwas admitted to the hospital with a working diagnosis of acute on chronic hypoxic, hypercapnic in the setting of acute metabolic encephalopathy.\  Patient's mentation and hemodynamics improved, received antibiotic therapy with vancomycin and now with daptomycin. She was successfully liberated from mechanical ventilation July 27.  Transferred to [facility] July 29.  Patient now pending TEE for further work up for bacteremia.   Assessment & Plan:   Principal Problem:   Bacteremia due to Staphylococcus aureus Active Problems:   Respiratory failure (HCC)   CKD (chronic kidney disease), stage IV (HCC)   Asthma   AKI (acute kidney injury) ([place])   Acute metabolic encephalopathy   1. Acute on chronic hypoxic and hypercapnic respiratory failure/  COPD exacerbation.Patient with positive desaturation on room air at reset (88%) and on ambulation with supplemental 02 (85% on 3 LPM). On bronchodilator therapy and inhaled corticosteroids. Oxymetry is 97% on 2 LPM per nasal cannula.   2. Metabolic encephalopathy.Resolved.  3. MRSA bacteremia.Last blood culture from 07/25 only one bottle positive for coagulase negative staph,(likely a contamination).Pending TEE to define duration of antibiotic therapy. Tolerating well IV daptomycin.   4. AKI on CKD stage IVwith metabolic alkalosis. Patient tolerating po well.  5. Obesity.Calculated BMIis43.3  6. Left lower extremity DVT (2018).Onapixabanfor anticoagulation, with good toleration.   DVT prophylaxis:apixaban Code Status:full Family Communication:no family at the bedside Disposition Plan/ discharge barriers:Pending TEE, for duration of antibiotic therapy.    Body mass index is 42.53 kg/m. Malnutrition Type:  Nutrition Problem: Inadequate oral intake Etiology: inability to eat   Malnutrition Characteristics:  Signs/Symptoms: NPO status   Nutrition Interventions:  Interventions: MVI, Tube feeding, Prostat  RN Pressure Injury Documentation:     Consultants:   ID  Cardiology   Procedures:     Antimicrobials:   Daptomycin.     Subjective: Patient is feeling well, her dyspnea continue to improve, no wheezing, no chest pain, no nausea or vomiting.   Objective: Vitals:   08/20/18 2002 08/20/18 2321 08/21/18 0809 08/21/18 0823  BP:  137/77 137/77 129/64  Pulse:  (!) 59 (!) 49 (!) 55  Resp:  20 18 (!) 30  Temp:  98.7 F (37.1 C)  98.5 F (36.9 C)  TempSrc:  Oral  Oral  SpO2: 97% 100% 98% 97%  Weight:      Height:        Intake/Output Summary (Last 24 hours) at 08/21/2018 1356 Last data filed at 08/21/2018 0012 Gross per 24 hour  Intake 240 ml  Output 600 ml  Net -  360 ml   Filed Weights   08/17/18 0600 08/18/18 0348 08/20/18  0500  Weight: 105.7 kg 105.4 kg 102.1 kg    Examination:   General: deconditioned  Neurology: Awake and alert, non focal  E ENT: mild pallor, no icterus, oral mucosa moist Cardiovascular: No JVD. S1-S2 present, rhythmic, no gallops, rubs, or murmurs. No lower extremity edema. Pulmonary: positive breath sounds bilaterally, adequate air movement, no wheezing, rhonchi or rales. Gastrointestinal. Abdomen with no organomegaly, non tender, no rebound or guarding Skin. No rashes Musculoskeletal: no joint deformities     Data Reviewed: I have personally reviewed following labs and imaging studies  CBC: Recent Labs  Lab 08/17/18 0601 08/18/18 0615 08/19/18 0518 08/20/18 0711 08/21/18 0557  WBC 4.8 4.9 4.6 4.6 4.6  HGB 11.2* 12.5 11.5* 11.3* 11.4*  HCT 37.7 42.2 38.9 37.1 38.5  MCV 95.2 95.3 94.9 93.5 93.7  PLT 109* 89* 85* 92* 95*   Basic Metabolic Panel: Recent Labs  Lab 08/15/18 0352 08/16/18 0917 08/17/18 0714 08/19/18 0518  NA 141 143 140 142  K 3.6 4.5 4.5 5.0  CL 96* 98 98 102  CO2 34* 35* 34* 31  GLUCOSE 104* 94 87 90  BUN 50* 46* 44* 40*  CREATININE 3.08* 3.02* 2.89* 2.71*  CALCIUM 9.1 9.6 9.6 9.4   GFR: Estimated Creatinine Clearance: 24.2 mL/min (A) (by C-G formula based on SCr of 2.71 mg/dL (H)). Liver Function Tests: Recent Labs  Lab 08/15/18 0352  AST 36  ALT 23  ALKPHOS 76  BILITOT 1.0  PROT 6.2*  ALBUMIN 2.9*   No results for input(s): LIPASE, AMYLASE in the last 168 hours. No results for input(s): AMMONIA in the last 168 hours. Coagulation Profile: No results for input(s): INR, PROTIME in the last 168 hours. Cardiac Enzymes: Recent Labs  Lab 08/15/18 0352  CKTOTAL 74   BNP (last 3 results) No results for input(s): PROBNP in the last 8760 hours. HbA1C: No results for input(s): HGBA1C in the last 72 hours. CBG: Recent Labs  Lab 08/20/18 1954 08/20/18 2352 08/21/18 0406 08/21/18 0821 08/21/18 1230  GLUCAP 119* 117* 86 100* 101*    Lipid Profile: No results for input(s): CHOL, HDL, LDLCALC, TRIG, CHOLHDL, LDLDIRECT in the last 72 hours. Thyroid Function Tests: No results for input(s): TSH, T4TOTAL, FREET4, T3FREE, THYROIDAB in the last 72 hours. Anemia Panel: No results for input(s): VITAMINB12, FOLATE, FERRITIN, TIBC, IRON, RETICCTPCT in the last 72 hours.    Radiology Studies: I have reviewed all of the imaging during this hospital visit personally     Scheduled Meds: . apixaban  5 mg Oral BID  . Chlorhexidine Gluconate Cloth  6 each Topical Daily  . ipratropium-albuterol  3 mL Nebulization BID  . mouth rinse  15 mL Mouth Rinse BID  . mometasone-formoterol  2 puff Inhalation BID  . mupirocin ointment  1 application Nasal BID   Continuous Infusions: . DAPTOmycin (CUBICIN)  IV 800 mg (08/20/18 2013)     LOS: 9 days        Ashrith Sagan Gerome Apley, MD

## 2018-08-21 NOTE — Progress Notes (Signed)
Pt is NPO for TEE later today. Sign on the door and all liquids taken off table. Pt informed earlier in the shift that she was going to be NPO after midnight so not to eat her breakfast in the morning if it comes into rm. Pt voiced understanding.

## 2018-08-22 ENCOUNTER — Encounter (HOSPITAL_COMMUNITY): Payer: Self-pay | Admitting: *Deleted

## 2018-08-22 ENCOUNTER — Inpatient Hospital Stay (HOSPITAL_COMMUNITY): Payer: Medicaid Other

## 2018-08-22 ENCOUNTER — Encounter (HOSPITAL_COMMUNITY)
Admission: AD | Disposition: A | Discharge status: Home / Self Care | Payer: Self-pay | Source: Other Acute Inpatient Hospital | Attending: Internal Medicine

## 2018-08-22 DIAGNOSIS — R7881 Bacteremia: Secondary | ICD-10-CM

## 2018-08-22 DIAGNOSIS — I340 Nonrheumatic mitral (valve) insufficiency: Secondary | ICD-10-CM

## 2018-08-22 HISTORY — PX: TEE WITHOUT CARDIOVERSION: SHX5443

## 2018-08-22 LAB — CK: Total CK: 278 U/L — ABNORMAL HIGH (ref 38–234)

## 2018-08-22 LAB — CBC
HCT: 38.1 % (ref 36.0–46.0)
Hemoglobin: 11.8 g/dL — ABNORMAL LOW (ref 12.0–15.0)
MCH: 28.4 pg (ref 26.0–34.0)
MCHC: 31 g/dL (ref 30.0–36.0)
MCV: 91.6 fL (ref 80.0–100.0)
Platelets: 101 10*3/uL — ABNORMAL LOW (ref 150–400)
RBC: 4.16 MIL/uL (ref 3.87–5.11)
RDW: 16.8 % — ABNORMAL HIGH (ref 11.5–15.5)
WBC: 4.7 10*3/uL (ref 4.0–10.5)
nRBC: 0 % (ref 0.0–0.2)

## 2018-08-22 LAB — CREATININE, SERUM
Creatinine, Ser: 2.86 mg/dL — ABNORMAL HIGH (ref 0.44–1.00)
GFR calc Af Amer: 20 mL/min — ABNORMAL LOW (ref >60)
GFR calc non Af Amer: 17 mL/min — ABNORMAL LOW (ref >60)

## 2018-08-22 LAB — GLUCOSE, CAPILLARY
Glucose-Capillary: 87 mg/dL (ref 70–99)
Glucose-Capillary: 91 mg/dL (ref 70–99)
Glucose-Capillary: 75 mg/dL (ref 70–99)

## 2018-08-22 SURGERY — ECHOCARDIOGRAM, TRANSESOPHAGEAL
Anesthesia: Moderate Sedation

## 2018-08-22 MED ORDER — MIDAZOLAM HCL (PF) 5 MG/ML IJ SOLN
INTRAMUSCULAR | Status: AC
  Filled 2018-08-22: qty 2

## 2018-08-22 MED ORDER — SODIUM CHLORIDE 0.9 % IV SOLN
INTRAVENOUS | Status: DC
  Administered 2018-08-22: 15:00:00 via INTRAVENOUS

## 2018-08-22 MED ORDER — SODIUM CHLORIDE 0.9 % IV SOLN: INTRAVENOUS | Status: DC

## 2018-08-22 MED ORDER — DIPHENHYDRAMINE HCL 50 MG/ML IJ SOLN
INTRAMUSCULAR | Status: AC
  Filled 2018-08-22: qty 1

## 2018-08-22 MED ORDER — BUTAMBEN-TETRACAINE-BENZOCAINE 2-2-14 % EX AERO
INHALATION_SPRAY | CUTANEOUS | Status: DC | PRN
  Administered 2018-08-22: 15:00:00 2 via TOPICAL

## 2018-08-22 MED ORDER — FENTANYL CITRATE (PF) 100 MCG/2ML IJ SOLN
INTRAMUSCULAR | Status: AC
  Filled 2018-08-22: qty 2

## 2018-08-22 MED ORDER — FENTANYL CITRATE (PF) 100 MCG/2ML IJ SOLN
INTRAMUSCULAR | Status: DC | PRN
  Administered 2018-08-22 (×2): 25 ug via INTRAVENOUS

## 2018-08-22 MED ORDER — MIDAZOLAM HCL (PF) 10 MG/2ML IJ SOLN
INTRAMUSCULAR | Status: DC | PRN
  Administered 2018-08-22 (×2): 2 mg via INTRAVENOUS

## 2018-08-22 NOTE — Interval H&P Note (Signed)
History and Physical Interval Note:  08/22/2018 2:35 PM  Kelsey Leonard  has presented today for surgery, with the diagnosis of Bacteremia.  The various methods of treatment have been discussed with the patient and family. After consideration of risks, benefits and other options for treatment, the patient has consented to  Procedure(s): TRANSESOPHAGEAL ECHOCARDIOGRAM (TEE) (N/A) as a surgical intervention.  The patient's history has been reviewed, patient examined, no change in status, stable for surgery.  I have reviewed the patient's chart and labs.  Questions were answered to the patient's satisfaction.     Kirk Ruths

## 2018-08-22 NOTE — Progress Notes (Signed)
SATURATION QUALIFICATIONS: (This note is used to comply with regulatory documentation for home oxygen)  Patient Saturations on Room Air at Rest = 84%  Patient Saturations on Room Air while Ambulating = 84%  Patient Saturations on 3 Liters of oxygen while Ambulating = 89%  Please briefly explain why patient needs home oxygen: Pt desaturates on RA at rest and during mobility.  She needs 3 L O2 [place] to maintain sats at 89% during gait.    Barbarann Ehlers Kemar Pandit, PT, DPT  Acute Rehabilitation [phone] pager [phone]) [phone] office  @ [name]: [phone]

## 2018-08-22 NOTE — CV Procedure (Signed)
    Transesophageal Echocardiogram Note  LEKISHA MCGHEE [id number] 1955/09/17  Procedure: Transesophageal Echocardiogram Indications: bacteremia  Procedure Details Consent: Obtained Time Out: Verified patient identification, verified procedure, site/side was marked, verified correct patient position, special equipment/implants available, Radiology Safety Procedures followed,  medications/allergies/relevent history reviewed, required imaging and test results available.  Performed  Medications:  During this procedure the patient is administered a total of Versed 4 mg and Fentanyl 50 mcg  to achieve and maintain moderate conscious sedation.  The patient's heart rate, blood pressure, and oxygen saturation are monitored continuously during the procedure. The period of conscious sedation is 30 minutes, of which I was present face-to-face 100% of this time.  Normal LV function; probable MV stranding but no vegetations; mild MR and TR.  Complications: No apparent complications Patient did tolerate procedure well.  Kirk Ruths, MD

## 2018-08-22 NOTE — Progress Notes (Signed)
Physical Therapy Treatment Patient Details Name: Kelsey Leonard MRN: [id number] DOB: 05/28/1955 Today's Date: 08/22/2018    History of Present Illness Pt adm from [place] with acute on chronic respiratory failure and MRSA bacteremia. Intubated 7/25-7/27. PMH - presumed Sarcoidosis (never biopsied), HTN, Asthma, Lt leg DVT May 2018, CKD 4, Schizophrenia, Depression, Hypothyroidism    PT Comments    Pt continues to desaturate on RA at rest and with mobility, requiring 3 L O2 [place] to maintain sats during gait at 89%.  HEP handout given and reviewed with pt and she would also benefit from a RW for home use.  PT will continue to follow acutely for safe mobility progression   Follow Up Recommendations  Other (comment)(pt does not qualify for HHPT/OT)     Equipment Recommendations  Rolling walker with 5" wheels;Other (comment)(home O2)    Recommendations for Other Services   NA     Precautions / Restrictions Precautions Precautions: Fall Precaution Comments: watch O2 Sats    Mobility  Bed Mobility Overal bed mobility: Modified Independent                Transfers Overall transfer level: Needs assistance Equipment used: Rolling walker (2 wheeled) Transfers: Sit to/from Stand Sit to Stand: Min guard Stand pivot transfers: Min guard       General transfer comment: Min guard assist for safety during transitions.  Much safer with RW.  Ambulation/Gait Ambulation/Gait assistance: Supervision Gait Distance (Feet): 85 Feet Assistive device: Rolling walker (2 wheeled) Gait Pattern/deviations: Step-through pattern;Shuffle;Trunk flexed Gait velocity: decreased Gait velocity interpretation: 1.31 - 2.62 ft/sec, indicative of limited community ambulator General Gait Details: Pt desaturates during gait on RA to 84%, needs 3 L O2 [place] to maintain at 89% during gait.  Pt is more steady with RW, but reports she does not have one at home.           Balance Overall balance  assessment: Needs assistance Sitting-balance support: No upper extremity supported;Feet supported Sitting balance-Leahy Scale: Good     Standing balance support: Single extremity supported Standing balance-Leahy Scale: Fair Standing balance comment: static standing close supervision, dynamic tasks such as peri care require one arm supported.                             Cognition Arousal/Alertness: Awake/alert Behavior During Therapy: WFL for tasks assessed/performed Overall Cognitive Status: History of cognitive impairments - at baseline                                 General Comments: suspect baseline delay      Exercises General Exercises - Upper Extremity Shoulder Flexion: AROM;Both;10 reps Elbow Flexion: AROM;Both;10 reps General Exercises - Lower Extremity Long Arc Quad: AROM;Both;10 reps Hip Flexion/Marching: AROM;Both;10 reps Toe Raises: AROM;Both;10 reps Heel Raises: AROM;Both;10 reps Other Exercises Other Exercises: HEP handout of above listed exercises given with instructions to preform elbow flexion with cans of food for weights.         Pertinent Vitals/Pain Pain Assessment: No/denies pain           PT Goals (current goals can now be found in the care plan section) Acute Rehab PT Goals Patient Stated Goal: get stronger Progress towards PT goals: Progressing toward goals    Frequency    Min 3X/week      PT Plan Current plan remains appropriate  AM-PAC PT "6 Clicks" Mobility   Outcome Measure  Help needed turning from your back to your side while in a flat bed without using bedrails?: None Help needed moving from lying on your back to sitting on the side of a flat bed without using bedrails?: None Help needed moving to and from a bed to a chair (including a wheelchair)?: A Little Help needed standing up from a chair using your arms (e.g., wheelchair or bedside chair)?: A Little Help needed to walk in hospital  room?: A Little Help needed climbing 3-5 steps with a railing? : A Little 6 Click Score: 20    End of Session Equipment Utilized During Treatment: Gait belt;Oxygen(3 L O2 [place]) Activity Tolerance: Patient limited by fatigue Patient left: in chair;with call bell/phone within reach;with chair alarm set Nurse Communication: Mobility status;Other (comment)(pt needs home O2 and RW) PT Visit Diagnosis: Unsteadiness on feet (R26.81);Muscle weakness (generalized) (M62.81)     Time: [id number] PT Time Calculation (min) (ACUTE ONLY): 49 min  Charges:  $Gait Training: 8-22 mins $Therapeutic Exercise: 8-22 mins $Therapeutic Activity: 8-22 mins                    Kelsey Leonard B. Kelsey Leonard, PT, DPT  Acute Rehabilitation [phone] pager [phone] office  @ Kelsey Leonard: [phone]   08/22/2018, 12:53 PM

## 2018-08-22 NOTE — Progress Notes (Signed)
PROGRESS NOTE    Kelsey Leonard  JQB:[id number] DOB: January 22, 1955 DOA: 08/12/2018 PCP: [place], [name], MD    Brief Narrative:  63 year old who presented with altered mental status. Patient had several weeks of dyspnea, lower extremity edema, and weight gain. Was admitted to [facility] with acute hypoxic respiratory failure 07-20. He was diagnosed with staph aureusbacteremia. She had rapid worsening of her mentation with worsening hypoxic and hypercapnic respiratory failure that required intubation and mechanical ventilation. She wasstarted onvasopressors (July 25) and transferred to [facility] further management. AT the time of her transferherblood pressure was 126/102, pulse rate 66, temperature 98.8, respiratory rate 18, oxygen saturation 98% on assist control mechanical ventilation. Sodium 138, potassium 3.9, chloride 92, bicarb 36, glucose 137, BUN 38, creatinine 3.1,white count 7.2, hemoglobin 13.2, hematocrit 42.1, platelets 94.Her chest radiograph had a right base atelectasis, mild cardiomegaly, right subclavian central venous catheter, nasogastric tube with tip below the diaphragm. Endotracheal tube above the carina.  Patientwas admitted to the hospital with a working diagnosis of acute on chronic hypoxic, hypercapnic in the setting of acute metabolic encephalopathy.\  Patient's mentation and hemodynamics improved, received antibiotic therapy with vancomycin and now with daptomycin. She was successfully liberated from mechanical ventilation July 27.  Transferred to [facility] July 29.  Patient now pending TEE for further work up for bacteremia.   Assessment & Plan:   Principal Problem:   Bacteremia due to Staphylococcus aureus Active Problems:   Respiratory failure (HCC)   CKD (chronic kidney disease), stage IV (HCC)   Asthma   AKI (acute kidney injury) ([place])   Acute metabolic encephalopathy   1. Acute on chronic hypoxic and hypercapnic respiratory failure/  COPD exacerbation.Patient with positive desaturation on room air at reset (88%) and on ambulation with supplemental 02 (85% on 3 LPM). Dyspnea now at baseline will continue with bronchodilator therapy and inhaled corticosteroids. Will arrange for home 02.    2. Metabolic encephalopathy.Likely multifactorial, clinically has resolved, will need home health and home physical therapy.   3. MRSA bacteremia.Blood culture from [facility] positive for MRSA, in [facility] 07/25 only one bottle positive for coagulase negative staph,(likely a contamination). Cultures from 08/03 have been no growth, patient will have TEE today, to define final duration of antibiotic therapy. Her mother and sister willing to help with home IV antibiotic therapy. Patient will need PICC line before discharge.   4. AKI on CKD stage IVwith metabolic alkalosis.  Stable renal function and electrolytes. Patient has been tolerating po well.   5. Obesity.Her calculated BMIis43.3  6. Left lower extremity DVT (2018).Continue withapixabanfor anticoagulation.   DVT prophylaxis:apixaban Code Status:full Family Communication:I spoke with patient's mother over the phone at the bedside and all questions were addressed.  Disposition Plan/ discharge barriers:Pending TEE, for duration of antibiotic therapy.      Body mass index is 42.49 kg/m. Malnutrition Type:  Nutrition Problem: Inadequate oral intake Etiology: inability to eat   Malnutrition Characteristics:  Signs/Symptoms: NPO status   Nutrition Interventions:  Interventions: MVI, Tube feeding, Prostat  RN Pressure Injury Documentation:     Consultants:   ID  Cardiology (TEE)  Procedures:   TEE  Antimicrobials:   Daptomycin     Subjective: Patient is feeling well, continue to be very weak and deconditioned, not yet back to baseline, no nausea or vomiting,. Dyspnea continue to improve.   Objective: Vitals:   08/22/18 0627  08/22/18 0722 08/22/18 0805 08/22/18 0809  BP:  131/62    Pulse:  (!) 53  Resp:      Temp:  98.4 F (36.9 C)    TempSrc:  Oral    SpO2:  99% 95% 96%  Weight: 102 kg     Height:        Intake/Output Summary (Last 24 hours) at 08/22/2018 1246 Last data filed at 08/21/2018 1600 Gross per 24 hour  Intake -  Output 700 ml  Net -700 ml   Filed Weights   08/18/18 0348 08/20/18 0500 08/22/18 0998  Weight: 105.4 kg 102.1 kg 102 kg    Examination:   General: deconditioned  Neurology: Awake and alert, non focal  E ENT: no pallor, no icterus, oral mucosa moist Cardiovascular: No JVD. S1-S2 present, rhythmic, no gallops, rubs, or murmurs. Trace lower extremity edema. Pulmonary: positive breath sounds bilaterally, no wheezing, rhonchi or rales. Gastrointestinal. Abdomen with no organomegaly, non tender, no rebound or guarding Skin. No rashes Musculoskeletal: no joint deformities     Data Reviewed: I have personally reviewed following labs and imaging studies  CBC: Recent Labs  Lab 08/18/18 0615 08/19/18 0518 08/20/18 0711 08/21/18 0557 08/22/18 0707  WBC 4.9 4.6 4.6 4.6 4.7  HGB 12.5 11.5* 11.3* 11.4* 11.8*  HCT 42.2 38.9 37.1 38.5 38.1  MCV 95.3 94.9 93.5 93.7 91.6  PLT 89* 85* 92* 95* 338*   Basic Metabolic Panel: Recent Labs  Lab 08/16/18 0917 08/17/18 0714 08/19/18 0518 08/22/18 0707  NA 143 140 142  --   K 4.5 4.5 5.0  --   CL 98 98 102  --   CO2 35* 34* 31  --   GLUCOSE 94 87 90  --   BUN 46* 44* 40*  --   CREATININE 3.02* 2.89* 2.71* 2.86*  CALCIUM 9.6 9.6 9.4  --    GFR: Estimated Creatinine Clearance: 23 mL/min (A) (by C-G formula based on SCr of 2.86 mg/dL (H)). Liver Function Tests: No results for input(s): AST, ALT, ALKPHOS, BILITOT, PROT, ALBUMIN in the last 168 hours. No results for input(s): LIPASE, AMYLASE in the last 168 hours. No results for input(s): AMMONIA in the last 168 hours. Coagulation Profile: No results for input(s): INR,  PROTIME in the last 168 hours. Cardiac Enzymes: Recent Labs  Lab 08/22/18 0707  CKTOTAL 278*   BNP (last 3 results) No results for input(s): PROBNP in the last 8760 hours. HbA1C: No results for input(s): HGBA1C in the last 72 hours. CBG: Recent Labs  Lab 08/21/18 0821 08/21/18 1230 08/21/18 1630 08/22/18 0720 08/22/18 1127  GLUCAP 100* 101* 97 87 91   Lipid Profile: No results for input(s): CHOL, HDL, LDLCALC, TRIG, CHOLHDL, LDLDIRECT in the last 72 hours. Thyroid Function Tests: No results for input(s): TSH, T4TOTAL, FREET4, T3FREE, THYROIDAB in the last 72 hours. Anemia Panel: No results for input(s): VITAMINB12, FOLATE, FERRITIN, TIBC, IRON, RETICCTPCT in the last 72 hours.    Radiology Studies: I have reviewed all of the imaging during this hospital visit personally     Scheduled Meds: . apixaban  5 mg Oral BID  . Chlorhexidine Gluconate Cloth  6 each Topical Daily  . ipratropium-albuterol  3 mL Nebulization BID  . mometasone-formoterol  2 puff Inhalation BID   Continuous Infusions: . DAPTOmycin (CUBICIN)  IV 800 mg (08/20/18 2013)     LOS: 10 days        Nigeria Lasseter Gerome Apley, MD

## 2018-08-22 NOTE — TOC Progression Note (Addendum)
Transition of Care [facility]) - Progression Note    Patient Details  Name: Kelsey Leonard MRN: [id number] Date of Birth: 1955-04-17  Transition of Care [facility]) CM/SW Contact  Donyea Beverlin, Abelino Derrick, RN Phone Number: 08/22/2018, 1:16 PM  Clinical Narrative:   Pt confirms she remains in agreement for DME company [employer] to provide IV antibiotics.  Pt also remains in agreement for [facility] to provided [facility] - agency is not able to provide other modalities (TOC unable to locate [facility] agencies that would accept insurance that could provide HHPT/OT as recommended).  CM made pt/therapy aware of barrier with HHPT and OT.  CM requested that [facility] therapy provide written instructions on home therapy exercises.   Pt stays with her parents and his sister serves as her support system - sister will transport pt home at discharge.  Pt is in agreement with recommended DME and chose [employer] for agency - agency contacted and referral accepted.       Expected Discharge Plan: [place] Barriers to Discharge: Continued Medical Work up(vs SNF)  Expected Discharge Plan and Services Expected Discharge Plan: [place]   Discharge Planning Services: CM Consult Post Acute Care Choice: [place] arrangements for the past 2 months: Single Family Home                 DME Arranged: 3-N-1, Oxygen, Walker rolling DME Agency: [employer] Date DME Agency Contacted: 08/22/18 Time DME Agency Contacted: 64 Representative spoke with at DME Agency: zack HH Arranged: RN [place] Agency: Other - See comment Date [place]: 08/22/18 Time [place]: 1035 Representative spoke with at [place]: Agency contacted by [employer] and accepted referral week of 7/31 - pt in agreement   Social Determinants of Health (SDOH) Interventions    Readmission Risk Interventions No flowsheet data found.

## 2018-08-22 NOTE — Plan of Care (Signed)
°  Problem: Education: °Goal: Ability to demonstrate management of disease process will improve °Outcome: Progressing °Goal: Ability to verbalize understanding of medication therapies will improve °Outcome: Progressing °Goal: Individualized Educational Video(s) °Outcome: Progressing °  °

## 2018-08-22 NOTE — Progress Notes (Signed)
Subjective: No new complaints   Antibiotics:  Anti-infectives (From admission, onward)   Start     Dose/Rate Route Frequency Ordered Stop   08/16/18 2000  DAPTOmycin (CUBICIN) 817 mg in sodium chloride 0.9 % IVPB  Status:  Discontinued     8 mg/kg  102.1 kg 232.7 mL/hr over 30 Minutes Intravenous Every 48 hours 08/14/18 1027 08/16/18 0744   08/16/18 2000  DAPTOmycin (CUBICIN) 800 mg in sodium chloride 0.9 % IVPB     800 mg 232 mL/hr over 30 Minutes Intravenous Every 48 hours 08/16/18 0744     08/15/18 0800  DAPTOmycin (CUBICIN) 817 mg in sodium chloride 0.9 % IVPB     8 mg/kg  102.1 kg 232.7 mL/hr over 30 Minutes Intravenous Daily 08/14/18 1027 08/15/18 0822   08/12/18 1709  vancomycin variable dose per unstable renal function (pharmacist dosing)  Status:  Discontinued      Does not apply See admin instructions 08/12/18 1717 08/13/18 1332      Medications: Scheduled Meds: . apixaban  5 mg Oral BID  . Chlorhexidine Gluconate Cloth  6 each Topical Daily  . ipratropium-albuterol  3 mL Nebulization BID  . mometasone-formoterol  2 puff Inhalation BID   Continuous Infusions: . DAPTOmycin (CUBICIN)  IV 800 mg (08/20/18 2013)   PRN Meds:.acetaminophen, bisacodyl, ipratropium-albuterol, phenol    Objective: Weight change:   Intake/Output Summary (Last 24 hours) at 08/22/2018 0917 Last data filed at 08/21/2018 1600 Gross per 24 hour  Intake -  Output 700 ml  Net -700 ml   Blood pressure 131/62, pulse (!) 53, temperature 98.4 F (36.9 C), temperature source Oral, resp. rate 18, height 5\' 1"  (1.549 m), weight 102 kg, SpO2 96 %. Temp:  [98.2 F (36.8 C)-98.9 F (37.2 C)] 98.4 F (36.9 C) (08/04 0722) Pulse Rate:  [50-53] 53 (08/04 0722) Resp:  [18-20] 18 (08/03 2300) BP: (127-151)/(62-77) 131/62 (08/04 0722) SpO2:  [95 %-99 %] 96 % (08/04 0809) Weight:  [102 kg] 102 kg (08/04 0627)  Physical Exam: General: Alert and awake, oriented x person and place but  slightly confused HEENT: anicteric sclera, EOMI CVS regular rate, normal no murmurs gallops or rubs heard Chest: , no wheezing no rhonchi no rales, no respiratory distress Abdomen: soft non-distended,  Extremities: no edema or deformity noted bilaterally Skin: no rashes no DFU Neuro: nonfocal  CBC:    BMET Recent Labs    08/22/18 0707  CREATININE 2.86*     Liver Panel  No results for input(s): PROT, ALBUMIN, AST, ALT, ALKPHOS, BILITOT, BILIDIR, IBILI in the last 72 hours.     Sedimentation Rate No results for input(s): ESRSEDRATE in the last 72 hours. C-Reactive Protein No results for input(s): CRP in the last 72 hours.  Micro Results: Recent Results (from the past 720 hour(s))  MRSA PCR Screening     Status: Abnormal   Collection Time: 08/12/18  3:31 PM   Specimen: Nasopharyngeal  Result Value Ref Range Status   MRSA by PCR POSITIVE (A) NEGATIVE Final    Comment:        The GeneXpert MRSA Assay (FDA approved for NASAL specimens only), is one component of a comprehensive MRSA colonization surveillance program. It is not intended to diagnose MRSA infection nor to guide or monitor treatment for MRSA infections. RESULT CALLED TO, READ BACK BY AND VERIFIED WITH: J.BEABRAUT RN AT 8115 08/12/2018 BY A.DAVIS Performed at [facility], [place],  [place] [address]   Culture, blood (Routine X 2) w Reflex to ID Panel     Status: None   Collection Time: 08/12/18  4:30 PM   Specimen: BLOOD LEFT ARM  Result Value Ref Range Status   Specimen Description BLOOD LEFT ARM  Final   Special Requests   Final    BOTTLES DRAWN AEROBIC ONLY Blood Culture results may not be optimal due to an inadequate volume of blood received in culture bottles   Culture   Final    NO GROWTH 5 DAYS Performed at [facility], [place] [address], [place], [place] [address]    Report Status 08/17/2018 FINAL  Final  Culture, blood (Routine X 2) w Reflex to ID Panel      Status: Abnormal   Collection Time: 08/12/18  4:43 PM   Specimen: BLOOD LEFT HAND  Result Value Ref Range Status   Specimen Description BLOOD LEFT HAND  Final   Special Requests   Final    BOTTLES DRAWN AEROBIC ONLY Blood Culture results may not be optimal due to an inadequate volume of blood received in culture bottles   Culture  Setup Time   Final    AEROBIC BOTTLE ONLY GRAM POSITIVE COCCI CRITICAL RESULT CALLED TO, READ BACK BY AND VERIFIED WITH: Tillman Sers PHARMD [id number] 08/14/18 A BROWNING    Culture (A)  Final    STAPHYLOCOCCUS SPECIES (COAGULASE NEGATIVE) THE SIGNIFICANCE OF ISOLATING THIS ORGANISM FROM A SINGLE SET OF BLOOD CULTURES WHEN MULTIPLE SETS ARE DRAWN IS UNCERTAIN. PLEASE NOTIFY THE MICROBIOLOGY DEPARTMENT WITHIN ONE WEEK IF SPECIATION AND SENSITIVITIES ARE REQUIRED. Performed at [facility], [place] [address], [place], [place] [address]    Report Status 08/16/2018 FINAL  Final  Blood Culture ID Panel (Reflexed)     Status: Abnormal   Collection Time: 08/12/18  4:43 PM  Result Value Ref Range Status   Enterococcus species NOT DETECTED NOT DETECTED Final   Listeria monocytogenes NOT DETECTED NOT DETECTED Final   Staphylococcus species DETECTED (A) NOT DETECTED Final    Comment: Methicillin (oxacillin) susceptible coagulase negative staphylococcus. Possible blood culture contaminant (unless isolated from more than one blood culture draw or clinical case suggests pathogenicity). No antibiotic treatment is indicated for blood  culture contaminants. CRITICAL RESULT CALLED TO, READ BACK BY AND VERIFIED WITH: Tillman Sers PHARMD [id number] 08/14/18 A BROWNING    Staphylococcus aureus (BCID) NOT DETECTED NOT DETECTED Final   Methicillin resistance NOT DETECTED NOT DETECTED Final   Streptococcus species NOT DETECTED NOT DETECTED Final   Streptococcus agalactiae NOT DETECTED NOT DETECTED Final   Streptococcus pneumoniae NOT DETECTED NOT DETECTED Final   Streptococcus pyogenes NOT  DETECTED NOT DETECTED Final   Acinetobacter baumannii NOT DETECTED NOT DETECTED Final   Enterobacteriaceae species NOT DETECTED NOT DETECTED Final   Enterobacter cloacae complex NOT DETECTED NOT DETECTED Final   Escherichia coli NOT DETECTED NOT DETECTED Final   Klebsiella oxytoca NOT DETECTED NOT DETECTED Final   Klebsiella pneumoniae NOT DETECTED NOT DETECTED Final   Proteus species NOT DETECTED NOT DETECTED Final   Serratia marcescens NOT DETECTED NOT DETECTED Final   Haemophilus influenzae NOT DETECTED NOT DETECTED Final   Neisseria meningitidis NOT DETECTED NOT DETECTED Final   Pseudomonas aeruginosa NOT DETECTED NOT DETECTED Final   Candida albicans NOT DETECTED NOT DETECTED Final   Candida glabrata NOT DETECTED NOT DETECTED Final   Candida krusei NOT DETECTED NOT DETECTED Final   Candida parapsilosis NOT DETECTED NOT DETECTED  Final   Candida tropicalis NOT DETECTED NOT DETECTED Final    Comment: Performed at [facility], [place] [address], [place], [place] [address]  Culture, respiratory (non-expectorated)     Status: None   Collection Time: 08/12/18 11:47 PM   Specimen: Endotracheal; Respiratory  Result Value Ref Range Status   Specimen Description ENDOTRACHEAL  Final   Special Requests NONE  Final   Gram Stain   Final    ABUNDANT WBC PRESENT,BOTH PMN AND MONONUCLEAR FEW GRAM POSITIVE COCCI RARE GRAM NEGATIVE RODS    Culture   Final    Consistent with normal respiratory flora. Performed at [facility], [place] [address], [place], [place] [address]    Report Status 08/15/2018 FINAL  Final    Studies/Results: No results found.    Assessment/Plan:  INTERVAL HISTORY:  Blood cultures here only grew a contaminant so far I repeated them as well yesterday  Principal Problem:   Bacteremia due to Staphylococcus aureus Active Problems:   Respiratory failure (HCC)   CKD (chronic kidney disease), stage IV (HCC)   Asthma   AKI (acute kidney injury) ([place])   Acute  metabolic encephalopathy    Kelsey Leonard is a 63 y.o. female with  MRSA bacteremia , respiratory failure requiring intubation.  #1       [place] [facility]icrobial Management Team Staphylococcus aureus bacteremia   Staphylococcus aureus bacteremia (SAB) is associated with a high rate of complications and mortality.  Specific aspects of clinical management are critical to optimizing the outcome of patients with SAB.  Therefore, the [facility] Antimicrobial Management Team [facility]) has initiated an intervention aimed at improving the management of SAB at [facility].  To do so, Infectious Diseases physicians are providing an evidence-based consult for the management of all patients with SAB.     Yes No Comments  Perform follow-up blood cultures (even if the patient is afebrile) to ensure clearance of bacteremia [x]  []   1 over repeat blood cultures is growing a coag negative staph which is likely contaminant) to repeat the blood cultures just for thoroughness sake  Remove vascular catheter and obtain follow-up blood cultures after the removal of the catheter [x]  []   do not place central line to be sure we have cleared her bacteremia  Perform echocardiography to evaluate for endocarditis (transthoracic ECHO is 40-50% sensitive, TEE is > 90% sensitive) [x]  []   I believe she is getting transesophageal echocardiogram today  Consult electrophysiologist to evaluate implanted cardiac device (pacemaker, ICD) []  []    Ensure source control [x]  []  Have all abscesses been drained effectively? Have deep seeded infections (septic joints or osteomyelitis) had appropriate surgical debridement?  Source remains unclear  Investigate for "metastatic" sites of infection [x]  []  Does the patient have ANY symptom or physical exam finding that would suggest a deeper infection (back or neck pain that may be suggestive of vertebral osteomyelitis or epidural abscess, muscle pain that could be a symptom of pyomyositis)?   Keep in mind that for deep seeded infections MRI imaging with contrast is preferred rather than other often insensitive tests such as plain x-rays, especially early in a patient's presentation.  Change antibiotic therapy to daptomycin []  []  Beta-lactam antibiotics are preferred for MSSA due to higher cure rates.   If on Vancomycin, goal trough should be 15 - 20 mcg/mL  Estimated duration of IV antibiotic therapy:  4-6 weeks []  []  Consult case management for probably prolonged outpatient IV antibiotic therapy  LOS: 10 days   Alcide Evener 08/22/2018, 9:17 AM

## 2018-08-22 NOTE — H&P (View-Only) (Signed)
Subjective: No new complaints   Antibiotics:  Anti-infectives (From admission, onward)   Start     Dose/Rate Route Frequency Ordered Stop   08/16/18 2000  DAPTOmycin (CUBICIN) 817 mg in sodium chloride 0.9 % IVPB  Status:  Discontinued     8 mg/kg  102.1 kg 232.7 mL/hr over 30 Minutes Intravenous Every 48 hours 08/14/18 1027 08/16/18 0744   08/16/18 2000  DAPTOmycin (CUBICIN) 800 mg in sodium chloride 0.9 % IVPB     800 mg 232 mL/hr over 30 Minutes Intravenous Every 48 hours 08/16/18 0744     08/15/18 0800  DAPTOmycin (CUBICIN) 817 mg in sodium chloride 0.9 % IVPB     8 mg/kg  102.1 kg 232.7 mL/hr over 30 Minutes Intravenous Daily 08/14/18 1027 08/15/18 0822   08/12/18 1709  vancomycin variable dose per unstable renal function (pharmacist dosing)  Status:  Discontinued      Does not apply See admin instructions 08/12/18 1717 08/13/18 1332      Medications: Scheduled Meds: . apixaban  5 mg Oral BID  . Chlorhexidine Gluconate Cloth  6 each Topical Daily  . ipratropium-albuterol  3 mL Nebulization BID  . mometasone-formoterol  2 puff Inhalation BID   Continuous Infusions: . DAPTOmycin (CUBICIN)  IV 800 mg (08/20/18 2013)   PRN Meds:.acetaminophen, bisacodyl, ipratropium-albuterol, phenol    Objective: Weight change:   Intake/Output Summary (Last 24 hours) at 08/22/2018 0917 Last data filed at 08/21/2018 1600 Gross per 24 hour  Intake -  Output 700 ml  Net -700 ml   Blood pressure 131/62, pulse (!) 53, temperature 98.4 F (36.9 C), temperature source Oral, resp. rate 18, height 5\' 1"  (1.549 m), weight 102 kg, SpO2 96 %. Temp:  [98.2 F (36.8 C)-98.9 F (37.2 C)] 98.4 F (36.9 C) (08/04 0722) Pulse Rate:  [50-53] 53 (08/04 0722) Resp:  [18-20] 18 (08/03 2300) BP: (127-151)/(62-77) 131/62 (08/04 0722) SpO2:  [95 %-99 %] 96 % (08/04 0809) Weight:  [102 kg] 102 kg (08/04 0627)  Physical Exam: General: Alert and awake, oriented x person and place but  slightly confused HEENT: anicteric sclera, EOMI CVS regular rate, normal no murmurs gallops or rubs heard Chest: , no wheezing no rhonchi no rales, no respiratory distress Abdomen: soft non-distended,  Extremities: no edema or deformity noted bilaterally Skin: no rashes no DFU Neuro: nonfocal  CBC:    BMET Recent Labs    08/22/18 0707  CREATININE 2.86*     Liver Panel  No results for input(s): PROT, ALBUMIN, AST, ALT, ALKPHOS, BILITOT, BILIDIR, IBILI in the last 72 hours.     Sedimentation Rate No results for input(s): ESRSEDRATE in the last 72 hours. C-Reactive Protein No results for input(s): CRP in the last 72 hours.  Micro Results: Recent Results (from the past 720 hour(s))  MRSA PCR Screening     Status: Abnormal   Collection Time: 08/12/18  3:31 PM   Specimen: Nasopharyngeal  Result Value Ref Range Status   MRSA by PCR POSITIVE (A) NEGATIVE Final    Comment:        The GeneXpert MRSA Assay (FDA approved for NASAL specimens only), is one component of a comprehensive MRSA colonization surveillance program. It is not intended to diagnose MRSA infection nor to guide or monitor treatment for MRSA infections. RESULT CALLED TO, READ BACK BY AND VERIFIED WITH: J.BEABRAUT RN AT 8341 08/12/2018 BY A.DAVIS Performed at [facility], [place],  [place] [address]   Culture, blood (Routine X 2) w Reflex to ID Panel     Status: None   Collection Time: 08/12/18  4:30 PM   Specimen: BLOOD LEFT ARM  Result Value Ref Range Status   Specimen Description BLOOD LEFT ARM  Final   Special Requests   Final    BOTTLES DRAWN AEROBIC ONLY Blood Culture results may not be optimal due to an inadequate volume of blood received in culture bottles   Culture   Final    NO GROWTH 5 DAYS Performed at [facility], [place] [address], [place], [place] [address]    Report Status 08/17/2018 FINAL  Final  Culture, blood (Routine X 2) w Reflex to ID Panel      Status: Abnormal   Collection Time: 08/12/18  4:43 PM   Specimen: BLOOD LEFT HAND  Result Value Ref Range Status   Specimen Description BLOOD LEFT HAND  Final   Special Requests   Final    BOTTLES DRAWN AEROBIC ONLY Blood Culture results may not be optimal due to an inadequate volume of blood received in culture bottles   Culture  Setup Time   Final    AEROBIC BOTTLE ONLY GRAM POSITIVE COCCI CRITICAL RESULT CALLED TO, READ BACK BY AND VERIFIED WITH: Tillman Sers PHARMD [id number] 08/14/18 A BROWNING    Culture (A)  Final    STAPHYLOCOCCUS SPECIES (COAGULASE NEGATIVE) THE SIGNIFICANCE OF ISOLATING THIS ORGANISM FROM A SINGLE SET OF BLOOD CULTURES WHEN MULTIPLE SETS ARE DRAWN IS UNCERTAIN. PLEASE NOTIFY THE MICROBIOLOGY DEPARTMENT WITHIN ONE WEEK IF SPECIATION AND SENSITIVITIES ARE REQUIRED. Performed at [facility], [place] [address] [place], [place] [address]    Report Status 08/16/2018 FINAL  Final  Blood Culture ID Panel (Reflexed)     Status: Abnormal   Collection Time: 08/12/18  4:43 PM  Result Value Ref Range Status   Enterococcus species NOT DETECTED NOT DETECTED Final   Listeria monocytogenes NOT DETECTED NOT DETECTED Final   Staphylococcus species DETECTED (A) NOT DETECTED Final    Comment: Methicillin (oxacillin) susceptible coagulase negative staphylococcus. Possible blood culture contaminant (unless isolated from more than one blood culture draw or clinical case suggests pathogenicity). No antibiotic treatment is indicated for blood  culture contaminants. CRITICAL RESULT CALLED TO, READ BACK BY AND VERIFIED WITH: Tillman Sers PHARMD [id number] 08/14/18 A BROWNING    Staphylococcus aureus (BCID) NOT DETECTED NOT DETECTED Final   Methicillin resistance NOT DETECTED NOT DETECTED Final   Streptococcus species NOT DETECTED NOT DETECTED Final   Streptococcus agalactiae NOT DETECTED NOT DETECTED Final   Streptococcus pneumoniae NOT DETECTED NOT DETECTED Final   Streptococcus pyogenes NOT  DETECTED NOT DETECTED Final   Acinetobacter baumannii NOT DETECTED NOT DETECTED Final   Enterobacteriaceae species NOT DETECTED NOT DETECTED Final   Enterobacter cloacae complex NOT DETECTED NOT DETECTED Final   Escherichia coli NOT DETECTED NOT DETECTED Final   Klebsiella oxytoca NOT DETECTED NOT DETECTED Final   Klebsiella pneumoniae NOT DETECTED NOT DETECTED Final   Proteus species NOT DETECTED NOT DETECTED Final   Serratia marcescens NOT DETECTED NOT DETECTED Final   Haemophilus influenzae NOT DETECTED NOT DETECTED Final   Neisseria meningitidis NOT DETECTED NOT DETECTED Final   Pseudomonas aeruginosa NOT DETECTED NOT DETECTED Final   Candida albicans NOT DETECTED NOT DETECTED Final   Candida glabrata NOT DETECTED NOT DETECTED Final   Candida krusei NOT DETECTED NOT DETECTED Final   Candida parapsilosis NOT DETECTED NOT DETECTED  Final   Candida tropicalis NOT DETECTED NOT DETECTED Final    Comment: Performed at [facility], [place] [address] [place], [place] [address]  Culture, respiratory (non-expectorated)     Status: None   Collection Time: 08/12/18 11:47 PM   Specimen: Endotracheal; Respiratory  Result Value Ref Range Status   Specimen Description ENDOTRACHEAL  Final   Special Requests NONE  Final   Gram Stain   Final    ABUNDANT WBC PRESENT,BOTH PMN AND MONONUCLEAR FEW GRAM POSITIVE COCCI RARE GRAM NEGATIVE RODS    Culture   Final    Consistent with normal respiratory flora. Performed at [facility], [place] [address] [place], [place] [address]    Report Status 08/15/2018 FINAL  Final    Studies/Results: No results found.    Assessment/Plan:  INTERVAL HISTORY:  Blood cultures here only grew a contaminant so far I repeated them as well yesterday  Principal Problem:   Bacteremia due to Staphylococcus aureus Active Problems:   Respiratory failure (HCC)   CKD (chronic kidney disease), stage IV (HCC)   Asthma   AKI (acute kidney injury) ([place])   Acute  metabolic encephalopathy    Kelsey Leonard is a 63 y.o. female with  MRSA bacteremia , respiratory failure requiring intubation.  #1       [place] Antimicrobial Management Team Staphylococcus aureus bacteremia   Staphylococcus aureus bacteremia (SAB) is associated with a high rate of complications and mortality.  Specific aspects of clinical management are critical to optimizing the outcome of patients with SAB.  Therefore, the [facility] Antimicrobial Management Team [facility]) has initiated an intervention aimed at improving the management of SAB at [facility].  To do so, Infectious Diseases physicians are providing an evidence-based consult for the management of all patients with SAB.     Yes No Comments  Perform follow-up blood cultures (even if the patient is afebrile) to ensure clearance of bacteremia [x]  []   1 over repeat blood cultures is growing a coag negative staph which is likely contaminant) to repeat the blood cultures just for thoroughness sake  Remove vascular catheter and obtain follow-up blood cultures after the removal of the catheter [x]  []   do not place central line to be sure we have cleared her bacteremia  Perform echocardiography to evaluate for endocarditis (transthoracic ECHO is 40-50% sensitive, TEE is > 90% sensitive) [x]  []   I believe she is getting transesophageal echocardiogram today  Consult electrophysiologist to evaluate implanted cardiac device (pacemaker, ICD) []  []    Ensure source control [x]  []  Have all abscesses been drained effectively? Have deep seeded infections (septic joints or osteomyelitis) had appropriate surgical debridement?  Source remains unclear  Investigate for "metastatic" sites of infection [x]  []  Does the patient have ANY symptom or physical exam finding that would suggest a deeper infection (back or neck pain that may be suggestive of vertebral osteomyelitis or epidural abscess, muscle pain that could be a symptom of pyomyositis)?   Keep in mind that for deep seeded infections MRI imaging with contrast is preferred rather than other often insensitive tests such as plain x-rays, especially early in a patient's presentation.  Change antibiotic therapy to daptomycin []  []  Beta-lactam antibiotics are preferred for MSSA due to higher cure rates.   If on Vancomycin, goal trough should be 15 - 20 mcg/mL  Estimated duration of IV antibiotic therapy:  4-6 weeks []  []  Consult case management for probably prolonged outpatient IV antibiotic therapy  LOS: 10 days   Alcide Evener 08/22/2018, 9:17 AM

## 2018-08-22 NOTE — Progress Notes (Signed)
  Echocardiogram Echocardiogram Transesophageal has been performed.  Kelsey Leonard 08/22/2018, 3:46 PM

## 2018-08-23 ENCOUNTER — Encounter (HOSPITAL_COMMUNITY): Payer: Self-pay | Admitting: Cardiology

## 2018-08-23 DIAGNOSIS — R251 Tremor, unspecified: Secondary | ICD-10-CM

## 2018-08-23 LAB — CBC
HCT: 37.1 % (ref 36.0–46.0)
Hemoglobin: 11.5 g/dL — ABNORMAL LOW (ref 12.0–15.0)
MCH: 28.6 pg (ref 26.0–34.0)
MCHC: 31 g/dL (ref 30.0–36.0)
MCV: 92.3 fL (ref 80.0–100.0)
Platelets: 102 10*3/uL — ABNORMAL LOW (ref 150–400)
RBC: 4.02 MIL/uL (ref 3.87–5.11)
RDW: 16.8 % — ABNORMAL HIGH (ref 11.5–15.5)
WBC: 4.9 10*3/uL (ref 4.0–10.5)
nRBC: 0 % (ref 0.0–0.2)

## 2018-08-23 LAB — BASIC METABOLIC PANEL
Anion gap: 9 (ref 5–15)
BUN: 44 mg/dL — ABNORMAL HIGH (ref 6–20)
CO2: 29 mmol/L (ref 22–32)
Calcium: 9.5 mg/dL (ref 8.9–10.3)
Chloride: 100 mmol/L (ref 98–111)
Creatinine, Ser: 2.79 mg/dL — ABNORMAL HIGH (ref 0.44–1.00)
GFR calc Af Amer: 21 mL/min — ABNORMAL LOW (ref >60)
GFR calc non Af Amer: 18 mL/min — ABNORMAL LOW (ref >60)
Glucose, Bld: 89 mg/dL (ref 70–99)
Potassium: 5.4 mmol/L — ABNORMAL HIGH (ref 3.5–5.1)
Sodium: 138 mmol/L (ref 135–145)

## 2018-08-23 LAB — GLUCOSE, CAPILLARY
Glucose-Capillary: 84 mg/dL (ref 70–99)
Glucose-Capillary: 120 mg/dL — ABNORMAL HIGH (ref 70–99)
Glucose-Capillary: 105 mg/dL — ABNORMAL HIGH (ref 70–99)
Glucose-Capillary: 127 mg/dL — ABNORMAL HIGH (ref 70–99)

## 2018-08-23 MED ORDER — SODIUM POLYSTYRENE SULFONATE 15 GM/60ML PO SUSP
15.0000 g | Freq: Once | ORAL | Status: AC
  Administered 2018-08-23: 15 g via ORAL
  Filled 2018-08-23: qty 60

## 2018-08-23 NOTE — Progress Notes (Signed)
Pharmacy Antibiotic Note  Kelsey Leonard is a 63 y.o. female admitted on 08/12/2018 with MRSA bacteremia.  Pharmacy has been consulted for Daptomycin dosing. TEE 8/4 showed no vegetations.  SCr relatively stable at 2.79.  Plan: Continue Daptomycin 8 mg/kg IV q48h CK at least weekly - re-ordered for tomorrow AM Monitor clinical progress, c/s, renal function F/u ID plans for LOT   Height: 5\' 1"  (154.9 cm) Weight: 228 lb 9.9 oz (103.7 kg) IBW/kg (Calculated) : 47.8  Temp (24hrs), Avg:98.1 F (36.7 C), Min:97.4 F (36.3 C), Max:98.5 F (36.9 C)  Recent Labs  Lab 08/17/18 0714  08/19/18 0518 08/20/18 0711 08/21/18 0557 08/22/18 0707 08/23/18 0551  WBC  --    < > 4.6 4.6 4.6 4.7 4.9  CREATININE 2.89*  --  2.71*  --   --  2.86* 2.79*   < > = values in this interval not displayed.    Estimated Creatinine Clearance: 23.8 mL/min (A) (by C-G formula based on SCr of 2.79 mg/dL (H)).    Allergies  Allergen Reactions  . Aspirin Nausea And Vomiting  . Penicillins Hives    Has patient had a PCN reaction causing immediate rash, facial/tongue/throat swelling, SOB or lightheadedness with hypotension: Yes Has patient had a PCN reaction causing severe rash involving mucus membranes or skin necrosis: Yes Has patient had a PCN reaction that required hospitalization: Yes Has patient had a PCN reaction occurring within the last 10 years: No If all of the above answers are "NO", then may proceed with Cephalosporin use.   . Shellfish Allergy Other (See Comments)  . Sulfa Antibiotics Rash    Elicia Lamp, PharmD, BCPS Please check AMION for all [place] contact numbers Clinical Pharmacist 08/23/2018 1:34 PM

## 2018-08-23 NOTE — Progress Notes (Signed)
PROGRESS NOTE    Kelsey Leonard  MEQ:[id number] DOB: December 14, 1955 DOA: 08/12/2018 PCP: [place], [name], MD   Brief Narrative: 63 year old female with history of COPD/asthma, stage IV CKD, schizophrenia, left leg DVT, depression, hypothyroidism was transferred from [facility] 7/25, she presented to [facility] ER 7/20 with lip swelling, found to have hypoxic respiratory failure from CHF. -She tested negative for COVID, during this hospitalization she was found to have MRSA bacteremia. -Hospitalization complicated by worsening mental status with myoclonic jerks, hypoxia/hypercapnia on 7/25 she was subsequently intubated and transferred to [facility] under PCCM service -She was extubated 7/27 -In terms of her bacteremia she was initially treated with vancomycin and then switched to daptomycin on 7/28 -Hospitalization was also complicated by AKI, ATN which is improving -Infectious disease following, repeat blood cultures are negative, completed TEE on 8/4 which was negative for endocarditis  Assessment & Plan:   1. Acute on chronic hypoxic and hypercapnic respiratory failure/ COPD exacerbation -Secondary to sepsis, MRSA bacteremia and COPD exacerbation -Status post VDRF, extubated 7/27 -Clinically improving, wean down O2 -Discharge planning  2. Metabolic encephalopathy. -Likely from sepsis, bacteremia and hypercarbia -Resolved -Was also seen by neurology earlier this admission, work-up including EEG was negative for seizures  3. MRSA bacteremia -Blood-cultures from [facility] were positive for MRSA on admission 7/20, repeat blood cultures here on 7/25 with 1 bottle growing coagulase-negative staph consistent with contamination, repeat cultures from 8/3 are negative -2D echocardiogram was negative for endocarditis -TEE 8/4 also negative for endocarditis -Infectious disease following, will determine duration of daptomycin, will need PICC line placed pending ID  recommendations  4. AKI on CKD stage IVwith metabolic alkalosis -Kidney function is stable at baseline -Hyperkalemia, will give Kayexalate x1, monitor bmet  5. Obesity.Her calculated BMIis43.3  6. Left lower extremity DVT (2018).Continue withapixabanfor anticoagulation.   DVT prophylaxis:apixaban Code Status:full Family Communication:No family at bedside  Disposition Plan/ discharge barriers:SNF likely tomorrow    Body mass index is 43.2 kg/m. Malnutrition Type:  Nutrition Problem: Inadequate oral intake Etiology: inability to eat   Malnutrition Characteristics:  Signs/Symptoms: NPO status   Nutrition Interventions:  Interventions: MVI, Tube feeding, Prostat  RN Pressure Injury Documentation:     Consultants:   ID  Cardiology (TEE)  Procedures:  TEE-normal EF, negative for endocarditis Antimicrobials:   Daptomycin     Subjective: -Feels okay, breathing overall better -Wondering how long she will need antibiotics  Objective: Vitals:   08/23/18 0414 08/23/18 0745 08/23/18 0759 08/23/18 0802  BP:  120/85    Pulse:  (!) 50    Resp:  12    Temp:  98.2 F (36.8 C)    TempSrc:  Oral    SpO2:  100% 99% 98%  Weight: 103.7 kg     Height:        Intake/Output Summary (Last 24 hours) at 08/23/2018 1218 Last data filed at 08/23/2018 1100 Gross per 24 hour  Intake 248.33 ml  Output 650 ml  Net -401.67 ml   Filed Weights   08/22/18 0627 08/22/18 1433 08/23/18 0414  Weight: 102 kg 102 kg 103.7 kg    Examination:   Gen: Obese chronically ill female, sitting up in bed, AAO x3 HEENT: PERRLA, Neck supple, no JVD Lungs: Poor air movement bilaterally otherwise clear  CVS: S1-S2/regular rate rhythm Abd: soft, Non tender, non distended, BS present Extremities: No edema skin: no new rashes     Data Reviewed: I have personally reviewed following labs and imaging studies  CBC: Recent Labs  Lab 08/19/18 0518 08/20/18 0711  08/21/18 0557 08/22/18 0707 08/23/18 0551  WBC 4.6 4.6 4.6 4.7 4.9  HGB 11.5* 11.3* 11.4* 11.8* 11.5*  HCT 38.9 37.1 38.5 38.1 37.1  MCV 94.9 93.5 93.7 91.6 92.3  PLT 85* 92* 95* 101* 128*   Basic Metabolic Panel: Recent Labs  Lab 08/17/18 0714 08/19/18 0518 08/22/18 0707 08/23/18 0551  NA 140 142  --  138  K 4.5 5.0  --  5.4*  CL 98 102  --  100  CO2 34* 31  --  29  GLUCOSE 87 90  --  89  BUN 44* 40*  --  44*  CREATININE 2.89* 2.71* 2.86* 2.79*  CALCIUM 9.6 9.4  --  9.5   GFR: Estimated Creatinine Clearance: 23.8 mL/min (A) (by C-G formula based on SCr of 2.79 mg/dL (H)). Liver Function Tests: No results for input(s): AST, ALT, ALKPHOS, BILITOT, PROT, ALBUMIN in the last 168 hours. No results for input(s): LIPASE, AMYLASE in the last 168 hours. No results for input(s): AMMONIA in the last 168 hours. Coagulation Profile: No results for input(s): INR, PROTIME in the last 168 hours. Cardiac Enzymes: Recent Labs  Lab 08/22/18 0707  CKTOTAL 278*   BNP (last 3 results) No results for input(s): PROBNP in the last 8760 hours. HbA1C: No results for input(s): HGBA1C in the last 72 hours. CBG: Recent Labs  Lab 08/22/18 0720 08/22/18 1127 08/22/18 1603 08/23/18 0743 08/23/18 1139  GLUCAP 87 91 75 84 120*   Lipid Profile: No results for input(s): CHOL, HDL, LDLCALC, TRIG, CHOLHDL, LDLDIRECT in the last 72 hours. Thyroid Function Tests: No results for input(s): TSH, T4TOTAL, FREET4, T3FREE, THYROIDAB in the last 72 hours. Anemia Panel: No results for input(s): VITAMINB12, FOLATE, FERRITIN, TIBC, IRON, RETICCTPCT in the last 72 hours.    Radiology Studies: I have reviewed all of the imaging during this hospital visit personally     Scheduled Meds: . apixaban  5 mg Oral BID  . Chlorhexidine Gluconate Cloth  6 each Topical Daily  . ipratropium-albuterol  3 mL Nebulization BID  . mometasone-formoterol  2 puff Inhalation BID   Continuous Infusions: .  DAPTOmycin (CUBICIN)  IV 800 mg (08/22/18 2223)     LOS: 11 days        Domenic Polite, MD

## 2018-08-23 NOTE — Progress Notes (Signed)
Subjective: No new complaints. Knee pain better.   Antibiotics:  Anti-infectives (From admission, onward)   Start     Dose/Rate Route Frequency Ordered Stop   08/16/18 2000  DAPTOmycin (CUBICIN) 817 mg in sodium chloride 0.9 % IVPB  Status:  Discontinued     8 mg/kg  102.1 kg 232.7 mL/hr over 30 Minutes Intravenous Every 48 hours 08/14/18 1027 08/16/18 0744   08/16/18 2000  DAPTOmycin (CUBICIN) 800 mg in sodium chloride 0.9 % IVPB     800 mg 232 mL/hr over 30 Minutes Intravenous Every 48 hours 08/16/18 0744     08/15/18 0800  DAPTOmycin (CUBICIN) 817 mg in sodium chloride 0.9 % IVPB     8 mg/kg  102.1 kg 232.7 mL/hr over 30 Minutes Intravenous Daily 08/14/18 1027 08/15/18 0822   08/12/18 1709  vancomycin variable dose per unstable renal function (pharmacist dosing)  Status:  Discontinued      Does not apply See admin instructions 08/12/18 1717 08/13/18 1332      Medications: Scheduled Meds: . apixaban  5 mg Oral BID  . Chlorhexidine Gluconate Cloth  6 each Topical Daily  . ipratropium-albuterol  3 mL Nebulization BID  . mometasone-formoterol  2 puff Inhalation BID   Continuous Infusions: . DAPTOmycin (CUBICIN)  IV 800 mg (08/22/18 2223)   PRN Meds:.acetaminophen, bisacodyl, ipratropium-albuterol, phenol    Objective: Weight change: 0 kg  Intake/Output Summary (Last 24 hours) at 08/23/2018 1344 Last data filed at 08/23/2018 1100 Gross per 24 hour  Intake 248.33 ml  Output 650 ml  Net -401.67 ml   Blood pressure 120/85, pulse (!) 50, temperature 98.2 F (36.8 C), temperature source Oral, resp. rate 12, height 5\' 1"  (1.549 m), weight 103.7 kg, SpO2 98 %. Temp:  [97.4 F (36.3 C)-98.5 F (36.9 C)] 98.2 F (36.8 C) (08/05 0745) Pulse Rate:  [49-97] 50 (08/05 0745) Resp:  [12-18] 12 (08/05 0745) BP: (113-154)/(51-90) 120/85 (08/05 0745) SpO2:  [93 %-100 %] 98 % (08/05 0802) FiO2 (%):  [1 %] 1 % (08/05 0011) Weight:  [102 kg-103.7 kg] 103.7 kg (08/05 0414)   Physical Exam: General: Alert and awake, oriented x person and place, seems better oriented today HEENT: anicteric sclera, EOMI CVS regular rate, normal no murmurs gallops or rubs heard Chest: , no wheezing no rhonchi no rales, no respiratory distress Abdomen: soft non-distended,  Extremities: no edema or deformity noted bilaterally Skin: no rashes no DFU Neuro: nonfocal, generalized tremors of upper extremeties  CBC: Lab Results  Component Value Date   WBC 4.9 08/23/2018   HGB 11.5 (L) 08/23/2018   HCT 37.1 08/23/2018   MCV 92.3 08/23/2018   PLT 102 (L) 08/23/2018      BMET Recent Labs    08/22/18 0707 08/23/18 0551  NA  --  138  K  --  5.4*  CL  --  100  CO2  --  29  GLUCOSE  --  89  BUN  --  44*  CREATININE 2.86* 2.79*  CALCIUM  --  9.5     Liver Panel  No results for input(s): PROT, ALBUMIN, AST, ALT, ALKPHOS, BILITOT, BILIDIR, IBILI in the last 72 hours.     Sedimentation Rate No results for input(s): ESRSEDRATE in the last 72 hours. C-Reactive Protein No results for input(s): CRP in the last 72 hours.  Micro Results: Recent Results (from the past 720 hour(s))  MRSA PCR Screening     Status: Abnormal  Collection Time: 08/12/18  3:31 PM   Specimen: Nasopharyngeal  Result Value Ref Range Status   MRSA by PCR POSITIVE (A) NEGATIVE Final    Comment:        The GeneXpert MRSA Assay (FDA approved for NASAL specimens only), is one component of a comprehensive MRSA colonization surveillance program. It is not intended to diagnose MRSA infection nor to guide or monitor treatment for MRSA infections. RESULT CALLED TO, READ BACK BY AND VERIFIED WITH: J.BEABRAUT RN AT 2671 08/12/2018 BY A.DAVIS Performed at [facility], [place] [address] [place], [place] [address]   Culture, blood (Routine X 2) w Reflex to ID Panel     Status: None   Collection Time: 08/12/18  4:30 PM   Specimen: BLOOD LEFT ARM  Result Value Ref Range Status   Specimen  Description BLOOD LEFT ARM  Final   Special Requests   Final    BOTTLES DRAWN AEROBIC ONLY Blood Culture results may not be optimal due to an inadequate volume of blood received in culture bottles   Culture   Final    NO GROWTH 5 DAYS Performed at [facility], [place] [address] [place], [place] [address]    Report Status 08/17/2018 FINAL  Final  Culture, blood (Routine X 2) w Reflex to ID Panel     Status: Abnormal   Collection Time: 08/12/18  4:43 PM   Specimen: BLOOD LEFT HAND  Result Value Ref Range Status   Specimen Description BLOOD LEFT HAND  Final   Special Requests   Final    BOTTLES DRAWN AEROBIC ONLY Blood Culture results may not be optimal due to an inadequate volume of blood received in culture bottles   Culture  Setup Time   Final    AEROBIC BOTTLE ONLY GRAM POSITIVE COCCI CRITICAL RESULT CALLED TO, READ BACK BY AND VERIFIED WITH: Tillman Sers PHARMD [id number] 08/14/18 A BROWNING    Culture (A)  Final    STAPHYLOCOCCUS SPECIES (COAGULASE NEGATIVE) THE SIGNIFICANCE OF ISOLATING THIS ORGANISM FROM A SINGLE SET OF BLOOD CULTURES WHEN MULTIPLE SETS ARE DRAWN IS UNCERTAIN. PLEASE NOTIFY THE MICROBIOLOGY DEPARTMENT WITHIN ONE WEEK IF SPECIATION AND SENSITIVITIES ARE REQUIRED. Performed at [facility], [place] [address] [place], [place] [address]    Report Status 08/16/2018 FINAL  Final  Blood Culture ID Panel (Reflexed)     Status: Abnormal   Collection Time: 08/12/18  4:43 PM  Result Value Ref Range Status   Enterococcus species NOT DETECTED NOT DETECTED Final   Listeria monocytogenes NOT DETECTED NOT DETECTED Final   Staphylococcus species DETECTED (A) NOT DETECTED Final    Comment: Methicillin (oxacillin) susceptible coagulase negative staphylococcus. Possible blood culture contaminant (unless isolated from more than one blood culture draw or clinical case suggests pathogenicity). No antibiotic treatment is indicated for blood  culture contaminants. CRITICAL RESULT CALLED  TO, READ BACK BY AND VERIFIED WITH: Tillman Sers PHARMD [id number] 08/14/18 A BROWNING    Staphylococcus aureus (BCID) NOT DETECTED NOT DETECTED Final   Methicillin resistance NOT DETECTED NOT DETECTED Final   Streptococcus species NOT DETECTED NOT DETECTED Final   Streptococcus agalactiae NOT DETECTED NOT DETECTED Final   Streptococcus pneumoniae NOT DETECTED NOT DETECTED Final   Streptococcus pyogenes NOT DETECTED NOT DETECTED Final   Acinetobacter baumannii NOT DETECTED NOT DETECTED Final   Enterobacteriaceae species NOT DETECTED NOT DETECTED Final   Enterobacter cloacae complex NOT DETECTED NOT DETECTED Final   Escherichia coli NOT DETECTED NOT  DETECTED Final   Klebsiella oxytoca NOT DETECTED NOT DETECTED Final   Klebsiella pneumoniae NOT DETECTED NOT DETECTED Final   Proteus species NOT DETECTED NOT DETECTED Final   Serratia marcescens NOT DETECTED NOT DETECTED Final   Haemophilus influenzae NOT DETECTED NOT DETECTED Final   Neisseria meningitidis NOT DETECTED NOT DETECTED Final   Pseudomonas aeruginosa NOT DETECTED NOT DETECTED Final   Candida albicans NOT DETECTED NOT DETECTED Final   Candida glabrata NOT DETECTED NOT DETECTED Final   Candida krusei NOT DETECTED NOT DETECTED Final   Candida parapsilosis NOT DETECTED NOT DETECTED Final   Candida tropicalis NOT DETECTED NOT DETECTED Final    Comment: Performed at [facility], [place] [address] [place], [place] [address]  Culture, respiratory (non-expectorated)     Status: None   Collection Time: 08/12/18 11:47 PM   Specimen: Endotracheal; Respiratory  Result Value Ref Range Status   Specimen Description ENDOTRACHEAL  Final   Special Requests NONE  Final   Gram Stain   Final    ABUNDANT WBC PRESENT,BOTH PMN AND MONONUCLEAR FEW GRAM POSITIVE COCCI RARE GRAM NEGATIVE RODS    Culture   Final    Consistent with normal respiratory flora. Performed at [facility], [place] [address] [place], [place] [address]    Report Status  08/15/2018 FINAL  Final  Culture, blood (Routine X 2) w Reflex to ID Panel     Status: None (Preliminary result)   Collection Time: 08/21/18  2:00 PM   Specimen: BLOOD RIGHT ARM  Result Value Ref Range Status   Specimen Description BLOOD RIGHT ARM  Final   Special Requests   Final    BOTTLES DRAWN AEROBIC AND ANAEROBIC Blood Culture adequate volume   Culture   Final    NO GROWTH 2 DAYS Performed at [facility], [place] [address], [place], [place] [address]    Report Status PENDING  Incomplete  Culture, blood (Routine X 2) w Reflex to ID Panel     Status: None (Preliminary result)   Collection Time: 08/21/18  2:20 PM   Specimen: BLOOD RIGHT HAND  Result Value Ref Range Status   Specimen Description BLOOD RIGHT HAND  Final   Special Requests   Final    BOTTLES DRAWN AEROBIC AND ANAEROBIC Blood Culture adequate volume   Culture   Final    NO GROWTH 2 DAYS Performed at [facility], [place] [address] [place], [place] [address]    Report Status PENDING  Incomplete    Studies/Results: No results found.    Assessment/Plan:  INTERVAL HISTORY:  Afebrile and doing well. BCx from 8/03 are no growth x 48 hours.   Principal Problem:   Bacteremia due to Staphylococcus aureus Active Problems:   Respiratory failure (HCC)   CKD (chronic kidney disease), stage IV (HCC)   Asthma   AKI (acute kidney injury) ([place])   Acute metabolic encephalopathy    Kelsey Leonard is a 63 y.o. female with  MRSA bacteremia , respiratory failure requiring intubation. TEE revealed no obvious vegetations however there is mention of "mitral valve stranding." It is possible that this is non-infectious in nature however concerning in the setting of bacteremia. She seems to be improving on daptomycin with improved mentation.  She had a bran MRI on 7/26 indicating no acute finding but more chronic small vessel ischemia and possible PRES.    MRSA bacteremia = would err on the side of caution and treat  as  complicated bacteremia with TEE findings with a plan for 6 weeks; may be able to stop at 4 weeks depending on how she is doing in follow up. Please continue to hold on PICC line for now.   Respiratory Failure = still wearing some oxygen but reports her breathing to be better   Medication Monitoring = CK elevation after initiating daptomycin - discussed with ID pharmacist Raquel Sarna and will monitor on twice weekly CKs for any changes.        [place] Antimicrobial Management Team Staphylococcus aureus bacteremia   Staphylococcus aureus bacteremia (SAB) is associated with a high rate of complications and mortality.  Specific aspects of clinical management are critical to optimizing the outcome of patients with SAB.  Therefore, the [facility] Antimicrobial Management Team [facility]) has initiated an intervention aimed at improving the management of SAB at [facility].  To do so, Infectious Diseases physicians are providing an evidence-based consult for the management of all patients with SAB.     Yes No Comments  Perform follow-up blood cultures (even if the patient is afebrile) to ensure clearance of bacteremia [x]  []   1 over repeat blood cultures is growing a coag negative staph which is likely contaminant) to repeat the blood cultures just for thoroughness sake  Remove vascular catheter and obtain follow-up blood cultures after the removal of the catheter [x]  []   do not place central line to be sure we have cleared her bacteremia  Perform echocardiography to evaluate for endocarditis (transthoracic ECHO is 40-50% sensitive, TEE is > 90% sensitive) [x]  []   I believe she is getting transesophageal echocardiogram today  Consult electrophysiologist to evaluate implanted cardiac device (pacemaker, ICD) []  []    Ensure source control [x]  []  Have all abscesses been drained effectively? Have deep seeded infections (septic joints or osteomyelitis) had appropriate surgical debridement?  Source remains unclear   Investigate for "metastatic" sites of infection [x]  []  Does the patient have ANY symptom or physical exam finding that would suggest a deeper infection (back or neck pain that may be suggestive of vertebral osteomyelitis or epidural abscess, muscle pain that could be a symptom of pyomyositis)?  Keep in mind that for deep seeded infections MRI imaging with contrast is preferred rather than other often insensitive tests such as plain x-rays, especially early in a patient's presentation.  Change antibiotic therapy to daptomycin []  []  Beta-lactam antibiotics are preferred for MSSA due to higher cure rates.   If on Vancomycin, goal trough should be 15 - 20 mcg/mL  Estimated duration of IV antibiotic therapy:  4-6 weeks []  []  Consult case management for probably prolonged outpatient IV antibiotic therapy      LOS: 11 days    Janene Madeira, MSN, NP-C [facility] [email or web address] Pager: [phone] Office: [phone] [place]: [phone]

## 2018-08-23 NOTE — Progress Notes (Signed)
PHARMACY CONSULT NOTE FOR:  OUTPATIENT  PARENTERAL ANTIBIOTIC THERAPY (OPAT)  Indication: MRSA bacteremia Regimen: Daptomycin 800mg  IV q48h End date: 09/23/2018  IV antibiotic discharge orders are pended. To discharging provider:  please sign these orders via discharge navigator,  Select New Orders & click on the button choice - Manage This Unsigned Work.    Elicia Lamp, PharmD, BCPS Please check AMION for all [place] contact numbers Clinical Pharmacist 08/23/2018 3:36 PM

## 2018-08-24 ENCOUNTER — Inpatient Hospital Stay (HOSPITAL_COMMUNITY): Payer: Medicaid Other

## 2018-08-24 ENCOUNTER — Encounter (HOSPITAL_COMMUNITY): Payer: Self-pay | Admitting: Interventional Radiology

## 2018-08-24 HISTORY — PX: IR FLUORO GUIDE CV LINE RIGHT: IMG2283

## 2018-08-24 HISTORY — PX: IR US GUIDE VASC ACCESS RIGHT: IMG2390

## 2018-08-24 LAB — CBC
HCT: 39.3 % (ref 36.0–46.0)
Hemoglobin: 12 g/dL (ref 12.0–15.0)
MCH: 28.4 pg (ref 26.0–34.0)
MCHC: 30.5 g/dL (ref 30.0–36.0)
MCV: 92.9 fL (ref 80.0–100.0)
Platelets: 102 10*3/uL — ABNORMAL LOW (ref 150–400)
RBC: 4.23 MIL/uL (ref 3.87–5.11)
RDW: 16.5 % — ABNORMAL HIGH (ref 11.5–15.5)
WBC: 5.4 10*3/uL (ref 4.0–10.5)
nRBC: 0 % (ref 0.0–0.2)

## 2018-08-24 LAB — BASIC METABOLIC PANEL
Anion gap: 11 (ref 5–15)
BUN: 45 mg/dL — ABNORMAL HIGH (ref 6–20)
CO2: 24 mmol/L (ref 22–32)
Calcium: 9.1 mg/dL (ref 8.9–10.3)
Chloride: 103 mmol/L (ref 98–111)
Creatinine, Ser: 3.06 mg/dL — ABNORMAL HIGH (ref 0.44–1.00)
GFR calc Af Amer: 18 mL/min — ABNORMAL LOW (ref >60)
GFR calc non Af Amer: 16 mL/min — ABNORMAL LOW (ref >60)
Glucose, Bld: 90 mg/dL (ref 70–99)
Potassium: 4.8 mmol/L (ref 3.5–5.1)
Sodium: 138 mmol/L (ref 135–145)

## 2018-08-24 LAB — GLUCOSE, CAPILLARY
Glucose-Capillary: 92 mg/dL (ref 70–99)
Glucose-Capillary: 105 mg/dL — ABNORMAL HIGH (ref 70–99)
Glucose-Capillary: 88 mg/dL (ref 70–99)

## 2018-08-24 LAB — CK: Total CK: 279 U/L — ABNORMAL HIGH (ref 38–234)

## 2018-08-24 MED ORDER — LIDOCAINE HCL 1 % IJ SOLN
INTRAMUSCULAR | Status: DC | PRN
  Administered 2018-08-24: 5 mL

## 2018-08-24 MED ORDER — DAPTOMYCIN IV (FOR PTA / DISCHARGE USE ONLY): 800.0000 mg | INTRAVENOUS | 0 refills | Status: AC

## 2018-08-24 MED ORDER — DIPHENHYDRAMINE HCL 12.5 MG/5ML PO ELIX
12.5000 mg | ORAL_SOLUTION | Freq: Once | ORAL | Status: DC
  Filled 2018-08-24: qty 5

## 2018-08-24 MED ORDER — LIDOCAINE HCL 1 % IJ SOLN
INTRAMUSCULAR | Status: AC
  Filled 2018-08-24: qty 20

## 2018-08-24 NOTE — TOC Transition Note (Addendum)
   Transition of Care [facility]) - CM/SW Discharge Note   Patient Details  Name: MAHLIA BAILER MRN: [id number] Date of Birth: 11/16/1955  Transition of Care [facility]) CM/SW Contact:  Maryclare Labrador, RN Phone Number: 08/24/2018, 9:52 AM   Clinical Narrative:   Pt to discharge home today - pt confirms that her family will provide 24 hour supervision at discharge as recommended.  CM requested attending to write order for [facility] and home oxygen.  CM informed both [facility] and [facility] that pt will discharge home today  Update:  CM informed that [facility] has now declined pt - agency reached out to [place] confirmed with that [facility] that they will now accept pt - will provide both HHRN and [place] can not be provided.  CM spoke with pt and pt is in agreement with [facility]    Final next level of care: Home w Hospice Care Barriers to Discharge: Barriers Resolved   Patient Goals and CMS Choice   CMS Medicare.gov Compare Post Acute Care list provided to:: Patient Choice offered to / list presented to : Patient  Discharge Placement                       Discharge Plan and Services   Discharge Planning Services: CM Consult Post Acute Care Choice: Home Health          DME Arranged: 3-N-1, Oxygen, [place] rolling DME Agency: [facility] Date DME Agency Contacted: 08/22/18 Time DME Agency Contacted: [phone] Representative spoke with at DME Agency: zack HH Arranged: RN, PT [place] Agency: [place] ([place]) Date [place]: 08/24/18 Time [place]: [phone] Representative spoke with at [place]: [place] ([place]) Interventions     Readmission Risk Interventions No flowsheet data found.

## 2018-08-24 NOTE — Procedures (Signed)
Interventional Radiology Procedure Note  Procedure: Image guided right IJ tunneled cuffed catheter placement. single lumen.  Complications: None  Recommendations:  - Ok to use - Do not submerge - Routine line care   Signed,  Dulcy Fanny. Earleen [place], DO

## 2018-08-24 NOTE — Progress Notes (Signed)
Occupational Therapy Treatment Patient Details Name: Kelsey Leonard MRN: [id number] DOB: 02-24-1955 Today's Date: 08/24/2018    History of present illness Pt adm from [place] with acute on chronic respiratory failure and MRSA bacteremia. Intubated 7/25-7/27. PMH - presumed Sarcoidosis (never biopsied), HTN, Asthma, Lt leg DVT May 2018, CKD 4, Schizophrenia, Depression, Hypothyroidism   OT comments  Pt progressing toward stated goals, focused session on planning for home (2/2 pt not able to have insurance coverage for [facility]), BADL progression, and ECS education. Pt stood at sink with min guard, leaning on sink to complete grooming tasks. Educated on ECS in relation to grooming and ADL tasks when at home. Helped pt problem solve use of BSC as commode and/or shower seat. Gave pt ECS h/o for review when returning home. Also encouraged pt to engage in BUE exercise using soup cans to maintain/progress strength. VSS throughout session, pt showing how to safely arrange O2 tubing to prevent falls. Would continue to recommend [place], but understand pt insurance does not cover. Will continue to follow while acute.   Follow Up Recommendations  Home health OT;Other (comment)(pt does not qualify due to insurance)    Equipment Recommendations  3 in 1 bedside commode    Recommendations for Other Services      Precautions / Restrictions Precautions Precautions: Fall Precaution Comments: watch O2 Sats Restrictions Weight Bearing Restrictions: No       Mobility Bed Mobility               General bed mobility comments: OOB in chair  Transfers Overall transfer level: Needs assistance Equipment used: Rolling walker (2 wheeled);None Transfers: Sit to/from Stand Sit to Stand: Supervision         General transfer comment: with/without RW, pt using sink to steady self prior to BADL engagement    Balance Overall balance assessment: Needs assistance Sitting-balance support: No upper extremity  supported;Feet supported Sitting balance-Leahy Scale: Good     Standing balance support: No upper extremity supported Standing balance-Leahy Scale: Fair Standing balance comment: static standing no UE support stable                           ADL either performed or assessed with clinical judgement   ADL Overall ADL's : Needs assistance/impaired Eating/Feeding: Set up;Sitting   Grooming: Wash/dry face;Oral care;Standing;Min guard Grooming Details (indicate cue type and reason): leans on sink for energy/balance       Lower Body Bathing Details (indicate cue type and reason): to fix socks prior to leaving chair     Lower Body Dressing: Min guard;Sit to/from stand Lower Body Dressing Details (indicate cue type and reason): to fix socks prior to leaving chair Toilet Transfer: Min Lobbyist Details (indicate cue type and reason): min guard for safety from [facility] to sink Toileting- Clothing Manipulation and Hygiene: Min guard;Sit to/from stand Toileting - Clothing Manipulation Details (indicate cue type and reason): stood to clean peri area with wash cloth     Functional mobility during ADLs: Supervision/safety;Min guard;Rolling walker General ADL Comments: focused session on BADL progression with ECS education     Vision Patient Visual Report: No change from baseline     Perception     Praxis      Cognition Arousal/Alertness: Awake/alert Behavior During Therapy: WFL for tasks assessed/performed Overall Cognitive Status: History of cognitive impairments - at baseline  Exercises     Shoulder Instructions       General Comments      Pertinent Vitals/ Pain       Pain Assessment: No/denies pain  Home Living                                          Prior Functioning/Environment              Frequency  Min 2X/week        Progress Toward Goals  OT  Goals(current goals can now be found in the care plan section)  Progress towards OT goals: Progressing toward goals  Acute Rehab OT Goals Patient Stated Goal: get stronger OT Goal Formulation: With patient Time For Goal Achievement: 08/30/18 Potential to Achieve Goals: Good  Plan Discharge plan needs to be updated;Frequency remains appropriate    Co-evaluation                 AM-PAC OT "6 Clicks" Daily Activity     Outcome Measure   Help from another person eating meals?: A Little Help from another person taking care of personal grooming?: A Little Help from another person toileting, which includes using toliet, bedpan, or urinal?: A Little Help from another person bathing (including washing, rinsing, drying)?: A Little Help from another person to put on and taking off regular upper body clothing?: A Little Help from another person to put on and taking off regular lower body clothing?: A Little 6 Click Score: 18    End of Session Equipment Utilized During Treatment: Gait belt;Rolling walker;Oxygen  OT Visit Diagnosis: Unsteadiness on feet (R26.81);Muscle weakness (generalized) (M62.81)   Activity Tolerance Patient tolerated treatment well   Patient Left with call bell/phone within reach;in chair;with chair alarm set   Nurse Communication          Time: [id number] OT Time Calculation (min): 18 min  Charges: OT General Charges $OT Visit: 1 Visit OT Treatments $Self Care/Home Management : 8-22 mins  Zenovia Jarred, MSOT, OTR/L [facility] Office: [place] 08/24/2018, 2:53 PM

## 2018-08-24 NOTE — Progress Notes (Signed)
Physical Therapy Treatment Patient Details Name: Kelsey Leonard MRN: [id number] DOB: [birth year] Today's Date: 08/24/2018    History of Present Illness Pt adm from [place] with acute on chronic respiratory failure and MRSA bacteremia. Intubated 7/25-7/27. PMH - presumed Sarcoidosis (never biopsied), HTN, Asthma, Lt leg DVT May 2018, CKD 4, Schizophrenia, Depression, Hypothyroidism    PT Comments    Patient moving better and able to return demonstrate previously provided HEP. Noted MD has ordered recommended DME for discharge home today (RW, 3n1). OT plans to see pt prior to discharge to address pt's questions with ADLs and use of DME.     Follow Up Recommendations  Other (comment);Home health PT(if pt qualifies for HHPT; pt aware she may not have this ben)     Equipment Recommendations  Rolling walker with 5" wheels;Other (comment)(home O2)    Recommendations for Other Services       Precautions / Restrictions Precautions Precautions: Fall Precaution Comments: watch O2 Sats Restrictions Weight Bearing Restrictions: No    Mobility  Bed Mobility Overal bed mobility: Modified Independent Bed Mobility: Supine to Sit     Supine to sit: Modified independent (Device/Increase time);HOB elevated        Transfers Overall transfer level: Needs assistance Equipment used: Rolling walker (2 wheeled) Transfers: Sit to/from Stand Sit to Stand: Supervision Stand pivot transfers: Modified independent (Device/Increase time)       General transfer comment: vc for initial hand placement for incr safety with RW  Ambulation/Gait             General Gait Details: Focus of session on HEP; PT did discuss and demonstrate safe use of RW and home O2, also turning sideways to go through narrow spaces (possibly BR door)   Stairs             Wheelchair Mobility    Modified Rankin (Stroke Patients Only)       Balance   Sitting-balance support: No upper extremity  supported;Feet supported Sitting balance-Leahy Scale: Good     Standing balance support: No upper extremity supported Standing balance-Leahy Scale: Fair Standing balance comment: static standing no UE support stable                            Cognition Arousal/Alertness: Awake/alert Behavior During Therapy: WFL for tasks assessed/performed Overall Cognitive Status: History of cognitive impairments - at baseline                                 General Comments: suspect baseline delay      Exercises General Exercises - Upper Extremity Shoulder Flexion: AROM;Both;5 reps Elbow Flexion: AROM;Both;5 reps General Exercises - Lower Extremity Long Arc Quad: AROM;Both;5 reps Hip Flexion/Marching: AROM;Both;5 reps Toe Raises: AROM;Both;5 reps Heel Raises: AROM;Both;5 reps Other Exercises Other Exercises: allowed pt to read handout previously provided and return demonstrate exercises; she needed min cues to further improve her technique, however overall performed adequately    General Comments        Pertinent Vitals/Pain Pain Assessment: No/denies pain    Home Living                      Prior Function            PT Goals (current goals can now be found in the care plan section) Acute Rehab PT Goals Patient Stated Goal: get  stronger Time For Goal Achievement: 08/29/18 Potential to Achieve Goals: Good Progress towards PT goals: Progressing toward goals    Frequency    Min 3X/week      PT Plan Discharge plan needs to be updated    Co-evaluation              AM-PAC PT "6 Clicks" Mobility   Outcome Measure  Help needed turning from your back to your side while in a flat bed without using bedrails?: None Help needed moving from lying on your back to sitting on the side of a flat bed without using bedrails?: None Help needed moving to and from a bed to a chair (including a wheelchair)?: A Little Help needed standing up from a  chair using your arms (e.g., wheelchair or bedside chair)?: None Help needed to walk in hospital room?: A Little Help needed climbing 3-5 steps with a railing? : A Little 6 Click Score: 21    End of Session Equipment Utilized During Treatment: Oxygen(3 L O2 [place]) Activity Tolerance: Patient tolerated treatment well Patient left: in chair;with call bell/phone within reach;with chair alarm set   PT Visit Diagnosis: Unsteadiness on feet (R26.81);Muscle weakness (generalized) (M62.81)     Time: [id number] PT Time Calculation (min) (ACUTE ONLY): 30 min  Charges:  $Therapeutic Exercise: 8-22 mins $Therapeutic Activity: 8-22 mins                       Barry Brunner, PT       Jeanie Cooks Sender Rueb 08/24/2018, 10:13 AM

## 2018-08-24 NOTE — Progress Notes (Signed)
Patient ID: Kelsey Leonard, female   DOB: [birth year], 63 y.o.   MRN: [id number] Request received on pt for IJ PICC placement for 6 week antibiotic therapy for MRSA bacteremia; she is on eliquis for LE DVT and has CKD as well. Details/risks of procedure, incl but not limited to, internal bleeding, infection, injury to adjacent structures d/w pt with her understanding and consent.

## 2018-08-24 NOTE — Progress Notes (Signed)
Subjective: No new complaints.   Antibiotics:  Anti-infectives (From admission, onward)   Start     Dose/Rate Route Frequency Ordered Stop   08/24/18 0000  daptomycin (CUBICIN) IVPB     800 mg Intravenous Every 48 hours 08/24/18 1028 09/24/18 2359   08/16/18 2000  DAPTOmycin (CUBICIN) 817 mg in sodium chloride 0.9 % IVPB  Status:  Discontinued     8 mg/kg  102.1 kg 232.7 mL/hr over 30 Minutes Intravenous Every 48 hours 08/14/18 1027 08/16/18 0744   08/16/18 2000  DAPTOmycin (CUBICIN) 800 mg in sodium chloride 0.9 % IVPB     800 mg 232 mL/hr over 30 Minutes Intravenous Every 48 hours 08/16/18 0744     08/15/18 0800  DAPTOmycin (CUBICIN) 817 mg in sodium chloride 0.9 % IVPB     8 mg/kg  102.1 kg 232.7 mL/hr over 30 Minutes Intravenous Daily 08/14/18 1027 08/15/18 0822   08/12/18 1709  vancomycin variable dose per unstable renal function (pharmacist dosing)  Status:  Discontinued      Does not apply See admin instructions 08/12/18 1717 08/13/18 1332      Medications: Scheduled Meds: . apixaban  5 mg Oral BID  . Chlorhexidine Gluconate Cloth  6 each Topical Daily  . diphenhydrAMINE  12.5 mg Oral Once  . ipratropium-albuterol  3 mL Nebulization BID  . lidocaine      . mometasone-formoterol  2 puff Inhalation BID   Continuous Infusions: . DAPTOmycin (CUBICIN)  IV 800 mg (08/22/18 2223)   PRN Meds:.acetaminophen, bisacodyl, ipratropium-albuterol, phenol    Objective: Weight change: -1.3 kg  Intake/Output Summary (Last 24 hours) at 08/24/2018 1350 Last data filed at 08/24/2018 0900 Gross per 24 hour  Intake 400 ml  Output 1550 ml  Net -1150 ml   Blood pressure 116/70, pulse (!) 53, temperature 98.4 F (36.9 C), temperature source Oral, resp. rate 16, height 5' 1" (1.549 m), weight 100.7 kg, SpO2 92 %. Temp:  [98.4 F (36.9 C)-98.9 F (37.2 C)] 98.4 F (36.9 C) (08/06 0747) Pulse Rate:  [50-54] 53 (08/06 0747) Resp:  [16] 16 (08/06 0747) BP:  (116-141)/(45-70) 116/70 (08/06 0747) SpO2:  [89 %-99 %] 92 % (08/06 0800) Weight:  [100.7 kg] 100.7 kg (08/06 0317)  Physical Exam: General: Alert and awake, oriented x person and place, seems better oriented today HEENT: anicteric sclera, EOMI CVS regular rate, normal no murmurs gallops or rubs heard Chest: , no wheezing no rhonchi no rales, no respiratory distress Abdomen: soft non-distended,  Extremities: no edema or deformity noted bilaterally Skin: no rashes no DFU Neuro: nonfocal, generalized tremors of upper extremeties  CBC: Lab Results  Component Value Date   WBC 5.4 08/24/2018   HGB 12.0 08/24/2018   HCT 39.3 08/24/2018   MCV 92.9 08/24/2018   PLT 102 (L) 08/24/2018      BMET Recent Labs    08/23/18 0551 08/24/18 0623  NA 138 138  K 5.4* 4.8  CL 100 103  CO2 29 24  GLUCOSE 89 90  BUN 44* 45*  CREATININE 2.79* 3.06*  CALCIUM 9.5 9.1     Liver Panel  No results for input(s): PROT, ALBUMIN, AST, ALT, ALKPHOS, BILITOT, BILIDIR, IBILI in the last 72 hours.     Sedimentation Rate No results for input(s): ESRSEDRATE in the last 72 hours. C-Reactive Protein No results for input(s): CRP in the last 72 hours.  Micro Results: Recent Results (from the past 720 hour(s))  MRSA  PCR Screening     Status: Abnormal   Collection Time: 08/12/18  3:31 PM   Specimen: Nasopharyngeal  Result Value Ref Range Status   MRSA by PCR POSITIVE (A) NEGATIVE Final    Comment:        The GeneXpert MRSA Assay (FDA approved for NASAL specimens only), is one component of a comprehensive MRSA colonization surveillance program. It is not intended to diagnose MRSA infection nor to guide or monitor treatment for MRSA infections. RESULT CALLED TO, READ BACK BY AND VERIFIED WITH: J.BEABRAUT RN AT 1610 08/12/2018 BY A.DAVIS Performed at [facility], [place] [address] [place], [place] [address]   Culture, blood (Routine X 2) w Reflex to ID Panel     Status: None    Collection Time: 08/12/18  4:30 PM   Specimen: BLOOD LEFT ARM  Result Value Ref Range Status   Specimen Description BLOOD LEFT ARM  Final   Special Requests   Final    BOTTLES DRAWN AEROBIC ONLY Blood Culture results may not be optimal due to an inadequate volume of blood received in culture bottles   Culture   Final    NO GROWTH 5 DAYS Performed at [facility], [place] [address] [place], [place] [address]    Report Status 08/17/2018 FINAL  Final  Culture, blood (Routine X 2) w Reflex to ID Panel     Status: Abnormal   Collection Time: 08/12/18  4:43 PM   Specimen: BLOOD LEFT HAND  Result Value Ref Range Status   Specimen Description BLOOD LEFT HAND  Final   Special Requests   Final    BOTTLES DRAWN AEROBIC ONLY Blood Culture results may not be optimal due to an inadequate volume of blood received in culture bottles   Culture  Setup Time   Final    AEROBIC BOTTLE ONLY GRAM POSITIVE COCCI CRITICAL RESULT CALLED TO, READ BACK BY AND VERIFIED WITH: Tillman Sers PHARMD 1914 08/14/18 A BROWNING    Culture (A)  Final    STAPHYLOCOCCUS SPECIES (COAGULASE NEGATIVE) THE SIGNIFICANCE OF ISOLATING THIS ORGANISM FROM A SINGLE SET OF BLOOD CULTURES WHEN MULTIPLE SETS ARE DRAWN IS UNCERTAIN. PLEASE NOTIFY THE MICROBIOLOGY DEPARTMENT WITHIN ONE WEEK IF SPECIATION AND SENSITIVITIES ARE REQUIRED. Performed at [facility], [place] [address] [place], [place] [address]    Report Status 08/16/2018 FINAL  Final  Blood Culture ID Panel (Reflexed)     Status: Abnormal   Collection Time: 08/12/18  4:43 PM  Result Value Ref Range Status   Enterococcus species NOT DETECTED NOT DETECTED Final   Listeria monocytogenes NOT DETECTED NOT DETECTED Final   Staphylococcus species DETECTED (A) NOT DETECTED Final    Comment: Methicillin (oxacillin) susceptible coagulase negative staphylococcus. Possible blood culture contaminant (unless isolated from more than one blood culture draw or clinical case suggests  pathogenicity). No antibiotic treatment is indicated for blood  culture contaminants. CRITICAL RESULT CALLED TO, READ BACK BY AND VERIFIED WITH: Tillman Sers PHARMD [id number] 08/14/18 A BROWNING    Staphylococcus aureus (BCID) NOT DETECTED NOT DETECTED Final   Methicillin resistance NOT DETECTED NOT DETECTED Final   Streptococcus species NOT DETECTED NOT DETECTED Final   Streptococcus agalactiae NOT DETECTED NOT DETECTED Final   Streptococcus pneumoniae NOT DETECTED NOT DETECTED Final   Streptococcus pyogenes NOT DETECTED NOT DETECTED Final   Acinetobacter baumannii NOT DETECTED NOT DETECTED Final   Enterobacteriaceae species NOT DETECTED NOT DETECTED Final   Enterobacter cloacae complex NOT DETECTED  NOT DETECTED Final   Escherichia coli NOT DETECTED NOT DETECTED Final   Klebsiella oxytoca NOT DETECTED NOT DETECTED Final   Klebsiella pneumoniae NOT DETECTED NOT DETECTED Final   Proteus species NOT DETECTED NOT DETECTED Final   Serratia marcescens NOT DETECTED NOT DETECTED Final   Haemophilus influenzae NOT DETECTED NOT DETECTED Final   Neisseria meningitidis NOT DETECTED NOT DETECTED Final   Pseudomonas aeruginosa NOT DETECTED NOT DETECTED Final   Candida albicans NOT DETECTED NOT DETECTED Final   Candida glabrata NOT DETECTED NOT DETECTED Final   Candida krusei NOT DETECTED NOT DETECTED Final   Candida parapsilosis NOT DETECTED NOT DETECTED Final   Candida tropicalis NOT DETECTED NOT DETECTED Final    Comment: Performed at [facility], [place] [address], [place], [place] [address]  Culture, respiratory (non-expectorated)     Status: None   Collection Time: 08/12/18 11:47 PM   Specimen: Endotracheal; Respiratory  Result Value Ref Range Status   Specimen Description ENDOTRACHEAL  Final   Special Requests NONE  Final   Gram Stain   Final    ABUNDANT WBC PRESENT,BOTH PMN AND MONONUCLEAR FEW GRAM POSITIVE COCCI RARE GRAM NEGATIVE RODS    Culture   Final    Consistent with normal  respiratory flora. Performed at [facility], [place] [address] [place], [place] [address]    Report Status 08/15/2018 FINAL  Final  Culture, blood (Routine X 2) w Reflex to ID Panel     Status: None (Preliminary result)   Collection Time: 08/21/18  2:00 PM   Specimen: BLOOD RIGHT ARM  Result Value Ref Range Status   Specimen Description BLOOD RIGHT ARM  Final   Special Requests   Final    BOTTLES DRAWN AEROBIC AND ANAEROBIC Blood Culture adequate volume   Culture   Final    NO GROWTH 2 DAYS Performed at [facility], [place] [address], [place], [place] [address]    Report Status PENDING  Incomplete  Culture, blood (Routine X 2) w Reflex to ID Panel     Status: None (Preliminary result)   Collection Time: 08/21/18  2:20 PM   Specimen: BLOOD RIGHT HAND  Result Value Ref Range Status   Specimen Description BLOOD RIGHT HAND  Final   Special Requests   Final    BOTTLES DRAWN AEROBIC AND ANAEROBIC Blood Culture adequate volume   Culture   Final    NO GROWTH 2 DAYS Performed at [facility], [place] [address] [place], [place] [address]    Report Status PENDING  Incomplete    Studies/Results: No results found.    Assessment/Plan:  INTERVAL HISTORY:  Afebrile and doing well. BCx from 8/03 are no growth x 48 hours. No PICC line yet  Principal Problem:   Bacteremia due to Staphylococcus aureus Active Problems:   Respiratory failure (HCC)   CKD (chronic kidney disease), stage IV (HCC)   Asthma   AKI (acute kidney injury) ([facility])   Acute metabolic encephalopathy    Kelsey Leonard is a 63 y.o. female with  MRSA bacteremia and respiratory failure requiring intubation. TEE revealed no obvious vegetations however there is mention of "mitral valve stranding." It is possible that this is non-infectious in nature however concerning in the setting of bacteremia. She seems to be improving on daptomycin with improved mentation. She had a bran MRI on 7/26 indicating no acute  finding that would be concerning for embolic sequelae of the stranding/infection but more chronic  small vessel ischemia and possible PRES.    MRSA bacteremia = would err on the side of caution and treat as complicated bacteremia with TEE findings with a plan for 6 weeks; may be able to stop at 4 weeks depending on how she is doing in follow up. Please continue to hold on PICC line for now. OPAT outlined below.   Respiratory Failure = Doing well on RA today.   Medication Monitoring = CK elevation after initiating daptomycin - discussed with ID pharmacist Raquel Sarna and will monitor on twice weekly CKs for any changes.    OPAT ORDERS:  Diagnosis: MSSA bacteremia   Culture Result: MSSA  Allergies  Allergen Reactions  . Aspirin Nausea And Vomiting  . Penicillins Hives    Has patient had a PCN reaction causing immediate rash, facial/tongue/throat swelling, SOB or lightheadedness with hypotension: Yes Has patient had a PCN reaction causing severe rash involving mucus membranes or skin necrosis: Yes Has patient had a PCN reaction that required hospitalization: Yes Has patient had a PCN reaction occurring within the last 10 years: No If all of the above answers are "NO", then may proceed with Cephalosporin use.   . Shellfish Allergy Other (See Comments)  . Sulfa Antibiotics Rash    Discharge antibiotics: daptomycin 108m/kg   Duration: 6 weeks   End Date: September 9th (@ time of follow up)  [facility] Protocol _x_ Please pull PIC at completion of IV antibiotics __ Please leave PIC in place until doctor has seen patient or been notified  Labs weekly while on IV antibiotics: _x_ CBC with differential _x_ BMP _x_ CK TWICE WEEKLY** __ CMP __ CRP __ ESR __ Vancomycin trough  Fax weekly labs to [phone] Clinic Follow Up Appt: September 9th @ 1:45 p.m. with Dr. VTommy Medal@ RBlue RidgeAntimicrobial Management Team Staphylococcus aureus  bacteremia   Staphylococcus aureus bacteremia (SAB) is associated with a high rate of complications and mortality.  Specific aspects of clinical management are critical to optimizing the outcome of patients with SAB.  Therefore, the [facility] ([facility] has initiated an intervention aimed at [facility]  To do so, Infectious Diseases physicians are providing an evidence-based consult for the management of all patients with SAB.     Yes No Comments  Perform follow-up blood cultures (even if the patient is afebrile) to ensure clearance of bacteremia [x] []  1 over repeat blood cultures is growing a coag negative staph which is likely contaminant) to repeat the blood cultures just for thoroughness sake  Remove vascular catheter and obtain follow-up blood cultures after the removal of the catheter [x] []  do not place central line to be sure we have cleared her bacteremia  Perform echocardiography to evaluate for endocarditis (transthoracic ECHO is 40-50% sensitive, TEE is > 90% sensitive) [x] []  I believe she is getting transesophageal echocardiogram today  Consult electrophysiologist to evaluate implanted cardiac device (pacemaker, ICD) [] []   Ensure source control [x] [] Have all abscesses been drained effectively? Have deep seeded infections (septic joints or osteomyelitis) had appropriate surgical debridement?  Source remains unclear  Investigate for "metastatic" sites of infection [x] [] Does the patient have ANY symptom or physical exam finding that would suggest a deeper infection (back or neck pain that may be suggestive of vertebral osteomyelitis or epidural abscess, muscle pain that could be  a symptom of pyomyositis)?  Keep in mind that for deep seeded infections MRI imaging with contrast is preferred rather than other often insensitive tests such as plain x-rays, especially early in a patient's presentation.  Change antibiotic  therapy to daptomycin [] [] Beta-lactam antibiotics are preferred for MSSA due to higher cure rates.   If on Vancomycin, goal trough should be 15 - 20 mcg/mL  Estimated duration of IV antibiotic therapy:  4-6 weeks [] [] Consult case management for probably prolonged outpatient IV antibiotic therapy      LOS: 12 days    Janene Madeira, MSN, NP-C [facility] Infectious Disease [email or web address] Pager: [phone] Office: [phone] [place]: [phone]

## 2018-08-24 NOTE — Discharge Summary (Signed)
Physician Discharge Summary  Kelsey Leonard MBT:[id number] DOB: 19-Jul-1955 DOA: 08/12/2018  PCP: [place], Kelsey Leonard  Admit date: 08/12/2018 Discharge date: 08/24/2018  Time spent: 35 minutes  Recommendations for Outpatient Follow-up:  1. Continue IV daptomycin until 09/23/2018 2. Infectious disease follow-up with Dr. Drucilla Leonard on 09/23/2018 3. Remove right IJ tunneled central line after antibiotics completed, could not place PICC line due to CKD 4 4. [facility] for stage IV kidney disease in 1 month, referral sent, please ensure follow-up 5. PCP Dr. Sarah Martinique in 1 week, consider discontinuing apixaban if anticoagulation duration is completed 6. Home health RN   Discharge Diagnoses:  Principal Problem:   Bacteremia due to MRSA Acute on chronic hypoxic and hypercarbic respiratory failure COPD Chronic respiratory failure (HCC)   CKD (chronic kidney disease), stage IV (HCC)   AKI (acute kidney injury) ([place])   Acute metabolic encephalopathy History of DVT on apixaban  Discharge Condition: Stable  Diet recommendation: Low-sodium, heart healthy Filed Weights   08/22/18 1433 08/23/18 0414 08/24/18 0317  Weight: 102 kg 103.7 kg 100.7 kg    History of present illness:  63 year old female with history of COPD/asthma, stage IV CKD, schizophrenia, left leg DVT, depression, hypothyroidism was transferred from [facility] 7/25, she presented to [facility] ER 7/20 with lip swelling, found to have hypoxic respiratory failure from CHF. -She tested negative for COVID, during this hospitalization she was found to have MRSA bacteremia. -Hospitalization complicated by worsening mental status with myoclonic jerks, hypoxia/hypercapnia on 7/25 she was subsequently intubated and transferred to [facility] under [facility] Course:   1. Acute on chronic hypoxic and hypercapnic respiratory failure/ COPD exacerbation -Transferred from [facility] with respiratory  failure which was multifactorial secondary to sepsis, MRSA bacteremia and COPD exacerbation -Was intubated upon transfer to [facility], extubated 7/27 -Clinically improving, weaned down O2 to 2 L -PT OT eval completed, home health recommended and set up  2. Metabolic encephalopathy. -Likely from sepsis, bacteremia and hypercarbia -Resolved -Was also seen by neurology earlier this admission, work-up including EEG was negative for seizures  3. MRSA bacteremia -Blood-cultures from [facility] were positive for MRSA on admission 7/20, repeat blood cultures here on 7/25 with 1 bottle growing coagulase-negative staph consistent with contamination, repeat cultures from 8/3 are negative -2D echocardiogram was negative for endocarditis -TEE 8/4 also negative for endocarditis however did note some stranding on the mitral valve -Infectious disease consulted, seen by Dr. Drucilla Leonard who recommended 6 weeks of IV daptomycin therapy, given CKD 4 she could not have a PICC line hence had a right IJ catheter placed in interventional radiology today  -She will be discharged home with home health nursing services to continue daptomycin until 9/5  -Infectious disease follow-up with Dr. Drucilla Leonard on 9/5  -Please remove right IJ tunneled catheter after antibiotic course is completed   4.  Stage IV chronic kidney disease -Kidney function is stable at baseline -Unfortunately does not see a nephrologist, has had CKD 3-4 for over 2 years now -Recommended importance of follow-up with nephrology, electronic referral sent  5. Obesity.Her calculatedBMIis43.3  6. Left lower extremity DVT (2018). -Continued onapixabanfor anticoagulation.   Discharge Exam: Vitals:   08/24/18 0759 08/24/18 0800  BP:    Pulse:    Resp:    Temp:    SpO2: (!) 89% 92%    General: AAOx3 Cardiovascular: S1S2/RRR Respiratory: CTAB  Discharge Instructions   Discharge Instructions    Ambulatory referral to  Nephrology   Complete by: As directed    Stage 4 Chronic kidney disease   Diet - low sodium heart healthy   Complete by: As directed    Home infusion instructions [facility] May follow [place] Dosing Protocol; May administer Cathflo as needed to maintain patency of vascular access device.; Flushing of vascular access device: per [facility] Protocol: 0.9% NaCl pre/post medica...   Complete by: As directed    Instructions: May follow [place] Dosing Protocol   Instructions: May administer Cathflo as needed to maintain patency of vascular access device.   Instructions: Flushing of vascular access device: per [facility] Protocol: 0.9% NaCl pre/post medication administration and prn patency; Heparin 100 u/ml, 67m for implanted ports and Heparin 10u/ml, 547mfor all other central venous catheters.   Instructions: May follow [facility] Anaphylaxis Protocol for First Dose Administration in the home: 0.9% NaCl at 25-50 ml/hr to maintain IV access for protocol meds. Epinephrine 0.3 ml IV/IM PRN and Benadryl 25-50 IV/IM PRN s/s of anaphylaxis.   Instructions: AdWallulanfusion Coordinator (RN) to assist per patient IV care needs in the home PRN.   Increase activity slowly   Complete by: As directed      Allergies as of 08/24/2018      Reactions   Aspirin Nausea And Vomiting   Penicillins Hives   Has patient had a PCN reaction causing immediate rash, facial/tongue/throat swelling, SOB or lightheadedness with hypotension: Yes Has patient had a PCN reaction causing severe rash involving mucus membranes or skin necrosis: Yes Has patient had a PCN reaction that required hospitalization: Yes Has patient had a PCN reaction occurring within the last 10 years: No If all of the above answers are "NO", then may proceed with Cephalosporin use.   Shellfish Allergy Other (See Comments)   Sulfa Antibiotics Rash      Medication List    TAKE these medications   benztropine 1 MG tablet Commonly known as:  COGENTIN Take 1 mg by mouth 2 (two) times daily.   Centrum Silver Adult 50+ Tabs Take 1 tablet by mouth daily.   daptomycin  IVPB Commonly known as: CUBICIN Inject 800 mg into the vein every other day. Indication:  MRSA bacteremia Last Day of Therapy:  09/23/2018 Labs - Twice weekly:  CBC/D, BMP, and CPK Labs - Every other week:  ESR and CRP   Eliquis 2.5 MG Tabs tablet Generic drug: apixaban Take 2.5 mg by mouth 2 (two) times daily.   Fanapt 6 MG Tabs Generic drug: Iloperidone Take 6 mg by mouth daily.   furosemide 40 MG tablet Commonly known as: LASIX Take 40 mg by mouth daily.   sertraline 50 MG tablet Commonly known as: ZOLOFT Take 50 mg by mouth daily.   solifenacin 5 MG tablet Commonly known as: VESICARE Take 5 mg by mouth daily.            Home Infusion Instuctions  (From admission, onward)         Start     Ordered   08/24/18 0000  Home infusion instructions [facility] May follow [facility] Protocol; May administer Cathflo as needed to maintain patency of vascular access device.; Flushing of vascular access device: [facility]: 0.9% NaCl pre/post medica...    Question Answer Comment  Instructions May follow [facility]iss[facility]osing Protocol   Instructions May administer Cathflo as needed to maintain patency of vascular access device.   Instructions Flushing of vascular access device: per  [facility] Protocol: 0.9% NaCl pre/post medication administration and prn patency; Heparin 100 u/ml, 48m for implanted ports and Heparin 10u/ml, 568mfor all other central venous catheters.   Instructions May follow [facility] Anaphylaxis Protocol for First Dose Administration in the home: 0.9% NaCl at 25-50 ml/hr to maintain IV access for protocol meds. Epinephrine 0.3 ml IV/IM PRN and Benadryl 25-50 IV/IM PRN s/s of anaphylaxis.   Instructions [facility] Infusion Coordinator (RN) to assist per patient IV care needs in the home PRN.      08/24/18 [place](From admission, onward)         Start     Ordered   08/24/18 1317  For home use only DME oxygen  Once    Question Answer Comment  Length of Need 6 Months   Mode or (Route) Nasal cannula   Liters per Minute 2   Frequency Continuous (stationary and portable oxygen unit needed)   Oxygen conserving device Yes   Oxygen delivery system Gas      08/24/18 1317   08/22/18 1115  For home use only DME Walker rolling  Once    Question:  Patient needs a walker to treat with the following condition  Answer:  Mobility impaired   08/22/18 1114   08/22/18 1114  For home use only DME 3 n 1  Once     08/22/18 1114         Allergies  Allergen Reactions  . Aspirin Nausea And Vomiting  . Penicillins Hives    Has patient had a PCN reaction causing immediate rash, facial/tongue/throat swelling, SOB or lightheadedness with hypotension: Yes Has patient had a PCN reaction causing severe rash involving mucus membranes or skin necrosis: Yes Has patient had a PCN reaction that required hospitalization: Yes Has patient had a PCN reaction occurring within the last 10 years: No If all of the above answers are "NO", then may proceed with Cephalosporin use.   . Shellfish Allergy Other (See Comments)  . Sulfa Antibiotics Rash   Follow-up Information    [facility] Follow up.   Why:  AdIngallsegistered Nurse and physical therapist       [facility] Follow up.   Why: Phone: [phone] information: [place]      JoMartiniqueSarah T, Leonard. Schedule an appointment as soon as possible for a visit in 1 week(s).   Specialty: Family Medicine Contact information: [phone]        VaTruman HaywardMD Follow up on 09/27/2018.   Specialty: Infectious Diseases Why: 1:45 p.m. appointment. Please arrive 15 minutes early to register.  Contact information: [address] [place] [phone]          The results of significant diagnostics from this hospitalization (including imaging, microbiology, ancillary and laboratory) are listed below for reference.    Significant Diagnostic Studies: Dg Chest 2 View  Result Date: 08/19/2018 CLINICAL DATA:  Dyspnea EXAM: CHEST - 2 VIEW COMPARISON:  August 14, 2018 FINDINGS: The heart size and mediastinal contours are stable. The heart size is enlarged. Central pulmonary vascular congestion with enlarged central pulmonary vessel are noted. There is no focal infiltrate or pleural effusion. The visualized skeletal structures are unremarkable. IMPRESSION: The heart size is enlarged. Central pulmonary vascular congestion with enlarged central pulmonary  vessel are noted. Electronically Signed   By: Abelardo Diesel M.D.   On: 08/19/2018 10:06   Mr Brain Wo Contrast  Result Date: 08/13/2018 CLINICAL DATA:  Encephalopathy with bacteremia and hypoxia EXAM: MRI HEAD WITHOUT CONTRAST TECHNIQUE: Multiplanar, multiecho pulse sequences of the brain and surrounding structures were obtained without intravenous contrast. COMPARISON:  None. FINDINGS: Brain: T2 hyperintensity in the bilateral globus pallidus as seen with mineralization, which is confirmed on other sequences. No superimposed diffusion abnormality or other deep gray nucleus signal abnormality as expected for toxic/metabolic insult. T2 hyperintensity in the pons that is indistinct and without expansion or diffusion abnormality. Appearance is most consistent with chronic microvascular ischemia. Would expect different shape, diffusion signal, or extra pontine findings if this were toxic/metabolic. Mild periventricular FLAIR hyperintensity attributed to the same. No focal cortical finding to implicate a seizure focus. Normal brain volume. No acute infarct, acute hemorrhage, hydrocephalus, or masslike finding Vascular: Major flow voids are preserved Skull and  upper cervical spine: Negative for focal marrow lesion Sinuses/Orbits: Bilateral mastoid opacification with mastoidectomy on the right. There may be associated restricted diffusion on the right. Negative nasopharynx. Retention cysts in the left maxillary sinus. IMPRESSION: 1. Likely no acute finding. 2. Signal abnormality in the pons is most likely from chronic small vessel ischemia. Posterior reversible encephalopathy syndrome is considered given history of hypertension but usually not limited to the pons. 3. Bilateral mastoid opacification with right mastoidectomy. A cholesteatoma could be present on the right given diffusion signal. Electronically Signed   By: Monte Fantasia M.D.   On: 08/13/2018 09:10   Dg Chest Port 1 View  Result Date: 08/14/2018 CLINICAL DATA:  Respiratory failure. EXAM: PORTABLE CHEST 1 VIEW COMPARISON:  08/12/2018. FINDINGS: Endotracheal tube, NG tube, right subclavian line stable position. Stable cardiomegaly. Stable pulmonary venous congestion. Mild bibasilar atelectasis. No pleural effusion or pneumothorax. IMPRESSION: 1.  Lines and tubes in stable position. 2.  Stable cardiomegaly and pulmonary venous congestion. 3.  Mild bibasilar atelectasis. Chest is unchanged from prior exam. Electronically Signed   By: Marcello Moores  Register   On: 08/14/2018 06:14   Dg Chest Port 1 View  Result Date: 08/12/2018 CLINICAL DATA:  Respiratory failure OG tube check PPE: I donned gloves and surgical mask Same for heather EXAM: PORTABLE CHEST 1 VIEW COMPARISON:  08/12/2018 FINDINGS: Endotracheal tube is in place with tip approximately 2 centimeters above the carina. A RIGHT central venous line tip overlies the superior vena cava. Nasogastric tube is in place, tip beyond the image beyond the gastroesophageal junction. The heart is enlarged. There is pulmonary vascular congestion, increased since the prior study. No focal consolidations. No pleural effusions. IMPRESSION: 1. Cardiomegaly and increased  vascular congestion. 2. Lines and tubes as described. Electronically Signed   By: Nolon Nations M.D.   On: 08/12/2018 18:23   Dg Abd Portable 1v  Result Date: 08/12/2018 CLINICAL DATA:  OG tube placement. EXAM: PORTABLE ABDOMEN - 1 VIEW COMPARISON:  None. FINDINGS: An OG tube is identified with tip overlying the mid stomach. Bowel gas pattern is unremarkable. IMPRESSION: OG tube with tip overlying the mid stomach. Electronically Signed   By: Margarette Canada M.D.   On: 08/12/2018 16:36    Microbiology: Recent Results (from the past 240 hour(s))  Culture, blood (Routine X 2) w Reflex to ID Panel     Status: None (Preliminary result)   Collection Time: 08/21/18  2:00 PM   Specimen: BLOOD RIGHT ARM  Result Value Ref Range Status  Specimen Description BLOOD RIGHT ARM  Final   Special Requests   Final    BOTTLES DRAWN AEROBIC AND ANAEROBIC Blood Culture adequate volume   Culture   Final    NO GROWTH 2 DAYS Performed at [facility], [address], [place], [place] [address]    Report Status PENDING  Incomplete  Culture, blood (Routine X 2) w Reflex to ID Panel     Status: None (Preliminary result)   Collection Time: 08/21/18  2:20 PM   Specimen: BLOOD RIGHT HAND  Result Value Ref Range Status   Specimen Description BLOOD RIGHT HAND  Final   Special Requests   Final    BOTTLES DRAWN AEROBIC AND ANAEROBIC Blood Culture adequate volume   Culture   Final    NO GROWTH 2 DAYS Performed at [facility], [place] [address], [place], [place] [address]    Report Status PENDING  Incomplete     Labs: Basic Metabolic Panel: Recent Labs  Lab 08/19/18 0518 08/22/18 0707 08/23/18 0551 08/24/18 0623  NA 142  --  138 138  K 5.0  --  5.4* 4.8  CL 102  --  100 103  CO2 31  --  29 24  GLUCOSE 90  --  89 90  BUN 40*  --  44* 45*  CREATININE 2.71* 2.86* 2.79* 3.06*  CALCIUM 9.4  --  9.5 9.1   Liver Function Tests: No results for input(s): AST, ALT, ALKPHOS, BILITOT, PROT, ALBUMIN in  the last 168 hours. No results for input(s): LIPASE, AMYLASE in the last 168 hours. No results for input(s): AMMONIA in the last 168 hours. CBC: Recent Labs  Lab 08/20/18 0711 08/21/18 0557 08/22/18 0707 08/23/18 0551 08/24/18 0623  WBC 4.6 4.6 4.7 4.9 5.4  HGB 11.3* 11.4* 11.8* 11.5* 12.0  HCT 37.1 38.5 38.1 37.1 39.3  MCV 93.5 93.7 91.6 92.3 92.9  PLT 92* 95* 101* 102* 102*   Cardiac Enzymes: Recent Labs  Lab 08/22/18 0707 08/24/18 0623  CKTOTAL 278* 279*   BNP: BNP (last 3 results) No results for input(s): BNP in the last 8760 hours.  ProBNP (last 3 results) No results for input(s): PROBNP in the last 8760 hours.  CBG: Recent Labs  Lab 08/23/18 1139 08/23/18 1619 08/23/18 2103 08/24/18 0746 08/24/18 1253  GLUCAP 120* 105* 127* 92 105*       Signed:  Domenic Polite Leonard.  [facility] 08/24/2018, 2:47 PM

## 2018-08-26 LAB — CULTURE, BLOOD (ROUTINE X 2)
Culture: NO GROWTH
Culture: NO GROWTH
Special Requests: ADEQUATE
Special Requests: ADEQUATE

## 2018-09-04 ENCOUNTER — Encounter: Payer: Self-pay | Admitting: Infectious Disease

## 2018-09-04 DIAGNOSIS — A4102 Sepsis due to Methicillin resistant Staphylococcus aureus: Secondary | ICD-10-CM | POA: Insufficient documentation

## 2018-09-27 ENCOUNTER — Emergency Department (HOSPITAL_COMMUNITY): Payer: Medicaid Other

## 2018-09-27 ENCOUNTER — Inpatient Hospital Stay (HOSPITAL_COMMUNITY)
Admission: EM | Admit: 2018-09-27 | Discharge: 2018-10-02 | DRG: 315 | Disposition: A | Discharge status: Home Health | Payer: Medicaid Other | Attending: Internal Medicine | Admitting: Internal Medicine

## 2018-09-27 ENCOUNTER — Other Ambulatory Visit: Payer: Self-pay

## 2018-09-27 ENCOUNTER — Encounter (HOSPITAL_COMMUNITY): Payer: Self-pay

## 2018-09-27 ENCOUNTER — Ambulatory Visit (INDEPENDENT_AMBULATORY_CARE_PROVIDER_SITE_OTHER): Payer: Medicaid Other | Admitting: Infectious Disease

## 2018-09-27 ENCOUNTER — Encounter: Payer: Self-pay | Admitting: Infectious Disease

## 2018-09-27 VITALS — BP 84/54 | HR 57 | Temp 98.4°F

## 2018-09-27 DIAGNOSIS — I080 Rheumatic disorders of both mitral and aortic valves: Secondary | ICD-10-CM | POA: Diagnosis present

## 2018-09-27 DIAGNOSIS — Z20828 Contact with and (suspected) exposure to other viral communicable diseases: Secondary | ICD-10-CM | POA: Diagnosis present

## 2018-09-27 DIAGNOSIS — Z886 Allergy status to analgesic agent status: Secondary | ICD-10-CM | POA: Diagnosis not present

## 2018-09-27 DIAGNOSIS — J9600 Acute respiratory failure, unspecified whether with hypoxia or hypercapnia: Secondary | ICD-10-CM

## 2018-09-27 DIAGNOSIS — Z880 Allergy status to penicillin: Secondary | ICD-10-CM

## 2018-09-27 DIAGNOSIS — I129 Hypertensive chronic kidney disease with stage 1 through stage 4 chronic kidney disease, or unspecified chronic kidney disease: Secondary | ICD-10-CM | POA: Diagnosis present

## 2018-09-27 DIAGNOSIS — K219 Gastro-esophageal reflux disease without esophagitis: Secondary | ICD-10-CM | POA: Diagnosis present

## 2018-09-27 DIAGNOSIS — Z86718 Personal history of other venous thrombosis and embolism: Secondary | ICD-10-CM | POA: Diagnosis not present

## 2018-09-27 DIAGNOSIS — Z888 Allergy status to other drugs, medicaments and biological substances status: Secondary | ICD-10-CM

## 2018-09-27 DIAGNOSIS — N184 Chronic kidney disease, stage 4 (severe): Secondary | ICD-10-CM

## 2018-09-27 DIAGNOSIS — J45909 Unspecified asthma, uncomplicated: Secondary | ICD-10-CM | POA: Diagnosis present

## 2018-09-27 DIAGNOSIS — R7881 Bacteremia: Secondary | ICD-10-CM | POA: Diagnosis present

## 2018-09-27 DIAGNOSIS — I959 Hypotension, unspecified: Secondary | ICD-10-CM

## 2018-09-27 DIAGNOSIS — Z8659 Personal history of other mental and behavioral disorders: Secondary | ICD-10-CM | POA: Diagnosis not present

## 2018-09-27 DIAGNOSIS — F209 Schizophrenia, unspecified: Secondary | ICD-10-CM | POA: Diagnosis present

## 2018-09-27 DIAGNOSIS — Z8249 Family history of ischemic heart disease and other diseases of the circulatory system: Secondary | ICD-10-CM

## 2018-09-27 DIAGNOSIS — Z6841 Body Mass Index (BMI) 40.0 and over, adult: Secondary | ICD-10-CM

## 2018-09-27 DIAGNOSIS — J9811 Atelectasis: Secondary | ICD-10-CM | POA: Diagnosis present

## 2018-09-27 DIAGNOSIS — I058 Other rheumatic mitral valve diseases: Secondary | ICD-10-CM

## 2018-09-27 DIAGNOSIS — E039 Hypothyroidism, unspecified: Secondary | ICD-10-CM | POA: Diagnosis present

## 2018-09-27 DIAGNOSIS — B9562 Methicillin resistant Staphylococcus aureus infection as the cause of diseases classified elsewhere: Secondary | ICD-10-CM | POA: Diagnosis present

## 2018-09-27 DIAGNOSIS — Z90710 Acquired absence of both cervix and uterus: Secondary | ICD-10-CM

## 2018-09-27 DIAGNOSIS — E669 Obesity, unspecified: Secondary | ICD-10-CM | POA: Diagnosis present

## 2018-09-27 DIAGNOSIS — F329 Major depressive disorder, single episode, unspecified: Secondary | ICD-10-CM | POA: Diagnosis present

## 2018-09-27 DIAGNOSIS — E875 Hyperkalemia: Secondary | ICD-10-CM

## 2018-09-27 DIAGNOSIS — Z7901 Long term (current) use of anticoagulants: Secondary | ICD-10-CM | POA: Diagnosis not present

## 2018-09-27 DIAGNOSIS — I059 Rheumatic mitral valve disease, unspecified: Secondary | ICD-10-CM | POA: Insufficient documentation

## 2018-09-27 DIAGNOSIS — J441 Chronic obstructive pulmonary disease with (acute) exacerbation: Secondary | ICD-10-CM | POA: Diagnosis not present

## 2018-09-27 DIAGNOSIS — J45901 Unspecified asthma with (acute) exacerbation: Secondary | ICD-10-CM | POA: Diagnosis not present

## 2018-09-27 DIAGNOSIS — D696 Thrombocytopenia, unspecified: Secondary | ICD-10-CM | POA: Diagnosis present

## 2018-09-27 DIAGNOSIS — Z8614 Personal history of Methicillin resistant Staphylococcus aureus infection: Secondary | ICD-10-CM

## 2018-09-27 DIAGNOSIS — I10 Essential (primary) hypertension: Secondary | ICD-10-CM | POA: Diagnosis present

## 2018-09-27 DIAGNOSIS — Z9049 Acquired absence of other specified parts of digestive tract: Secondary | ICD-10-CM

## 2018-09-27 DIAGNOSIS — Z882 Allergy status to sulfonamides status: Secondary | ICD-10-CM | POA: Diagnosis not present

## 2018-09-27 DIAGNOSIS — Z79899 Other long term (current) drug therapy: Secondary | ICD-10-CM

## 2018-09-27 DIAGNOSIS — Z452 Encounter for adjustment and management of vascular access device: Secondary | ICD-10-CM

## 2018-09-27 DIAGNOSIS — Z833 Family history of diabetes mellitus: Secondary | ICD-10-CM

## 2018-09-27 DIAGNOSIS — M1990 Unspecified osteoarthritis, unspecified site: Secondary | ICD-10-CM | POA: Diagnosis present

## 2018-09-27 DIAGNOSIS — I358 Other nonrheumatic aortic valve disorders: Secondary | ICD-10-CM | POA: Insufficient documentation

## 2018-09-27 DIAGNOSIS — N179 Acute kidney failure, unspecified: Secondary | ICD-10-CM | POA: Diagnosis present

## 2018-09-27 DIAGNOSIS — R32 Unspecified urinary incontinence: Secondary | ICD-10-CM | POA: Diagnosis present

## 2018-09-27 DIAGNOSIS — J439 Emphysema, unspecified: Secondary | ICD-10-CM | POA: Diagnosis present

## 2018-09-27 DIAGNOSIS — B9561 Methicillin susceptible Staphylococcus aureus infection as the cause of diseases classified elsewhere: Secondary | ICD-10-CM | POA: Diagnosis present

## 2018-09-27 HISTORY — DX: Sepsis, unspecified organism: A41.9

## 2018-09-27 HISTORY — DX: Hypotension, unspecified: I95.9

## 2018-09-27 HISTORY — DX: Other nonrheumatic aortic valve disorders: I35.8

## 2018-09-27 HISTORY — DX: Rheumatic mitral valve disease, unspecified: I05.9

## 2018-09-27 LAB — CBC WITH DIFFERENTIAL/PLATELET
Abs Immature Granulocytes: 0.01 10*3/uL (ref 0.00–0.07)
Basophils Absolute: 0 10*3/uL (ref 0.0–0.1)
Basophils Relative: 0 %
Eosinophils Absolute: 0.3 10*3/uL (ref 0.0–0.5)
Eosinophils Relative: 5 %
HCT: 37 % (ref 36.0–46.0)
Hemoglobin: 11.5 g/dL — ABNORMAL LOW (ref 12.0–15.0)
Immature Granulocytes: 0 %
Lymphocytes Relative: 34 %
Lymphs Abs: 1.9 10*3/uL (ref 0.7–4.0)
MCH: 28.8 pg (ref 26.0–34.0)
MCHC: 31.1 g/dL (ref 30.0–36.0)
MCV: 92.5 fL (ref 80.0–100.0)
Monocytes Absolute: 0.5 10*3/uL (ref 0.1–1.0)
Monocytes Relative: 9 %
Neutro Abs: 2.8 10*3/uL (ref 1.7–7.7)
Neutrophils Relative %: 52 %
Platelets: 102 10*3/uL — ABNORMAL LOW (ref 150–400)
RBC: 4 MIL/uL (ref 3.87–5.11)
RDW: 17.6 % — ABNORMAL HIGH (ref 11.5–15.5)
WBC: 5.5 10*3/uL (ref 4.0–10.5)
nRBC: 0 % (ref 0.0–0.2)

## 2018-09-27 LAB — SARS CORONAVIRUS 2 BY RT PCR (HOSPITAL ORDER, PERFORMED IN ~~LOC~~ HOSPITAL LAB): SARS Coronavirus 2: NEGATIVE

## 2018-09-27 LAB — COMPREHENSIVE METABOLIC PANEL
ALT: 15 U/L (ref 0–44)
AST: 27 U/L (ref 15–41)
Albumin: 3 g/dL — ABNORMAL LOW (ref 3.5–5.0)
Alkaline Phosphatase: 72 U/L (ref 38–126)
Anion gap: 9 (ref 5–15)
BUN: 58 mg/dL — ABNORMAL HIGH (ref 6–20)
CO2: 25 mmol/L (ref 22–32)
Calcium: 9.4 mg/dL (ref 8.9–10.3)
Chloride: 106 mmol/L (ref 98–111)
Creatinine, Ser: 3.41 mg/dL — ABNORMAL HIGH (ref 0.44–1.00)
GFR calc Af Amer: 16 mL/min — ABNORMAL LOW (ref >60)
GFR calc non Af Amer: 14 mL/min — ABNORMAL LOW (ref >60)
Glucose, Bld: 107 mg/dL — ABNORMAL HIGH (ref 70–99)
Potassium: 4.6 mmol/L (ref 3.5–5.1)
Sodium: 140 mmol/L (ref 135–145)
Total Bilirubin: 0.6 mg/dL (ref 0.3–1.2)
Total Protein: 6.8 g/dL (ref 6.5–8.1)

## 2018-09-27 LAB — URINALYSIS, ROUTINE W REFLEX MICROSCOPIC
Bilirubin Urine: NEGATIVE
Glucose, UA: NEGATIVE mg/dL
Ketones, ur: NEGATIVE mg/dL
Leukocytes,Ua: NEGATIVE
Nitrite: NEGATIVE
Protein, ur: 100 mg/dL — AB
Specific Gravity, Urine: 1.012 (ref 1.005–1.030)
pH: 5 (ref 5.0–8.0)

## 2018-09-27 LAB — LACTIC ACID, PLASMA
Lactic Acid, Venous: 1.4 mmol/L (ref 0.5–1.9)
Lactic Acid, Venous: 1.1 mmol/L (ref 0.5–1.9)

## 2018-09-27 LAB — TROPONIN I (HIGH SENSITIVITY)
Troponin I (High Sensitivity): 101 ng/L (ref <18)
Troponin I (High Sensitivity): 96 ng/L — ABNORMAL HIGH (ref <18)

## 2018-09-27 MED ORDER — VANCOMYCIN HCL 10 G IV SOLR
2000.0000 mg | Freq: Once | INTRAVENOUS | Status: AC
  Administered 2018-09-27: 2000 mg via INTRAVENOUS
  Filled 2018-09-27: qty 2000

## 2018-09-27 MED ORDER — SODIUM CHLORIDE 0.9 % IV BOLUS (SEPSIS)
1000.0000 mL | Freq: Once | INTRAVENOUS | Status: AC
  Administered 2018-09-27: 1000 mL via INTRAVENOUS

## 2018-09-27 MED ORDER — VANCOMYCIN HCL IN DEXTROSE 1-5 GM/200ML-% IV SOLN: 1000.0000 mg | Freq: Once | INTRAVENOUS | Status: DC

## 2018-09-27 MED ORDER — SODIUM CHLORIDE 0.9 % IV SOLN
2.0000 g | Freq: Once | INTRAVENOUS | Status: AC
  Administered 2018-09-27: 2 g via INTRAVENOUS
  Filled 2018-09-27: qty 2

## 2018-09-27 MED ORDER — METRONIDAZOLE IN NACL 5-0.79 MG/ML-% IV SOLN
500.0000 mg | Freq: Once | INTRAVENOUS | Status: AC
  Administered 2018-09-27: 18:00:00 500 mg via INTRAVENOUS
  Filled 2018-09-27: qty 100

## 2018-09-27 MED ORDER — SODIUM CHLORIDE 0.9 % IV BOLUS (SEPSIS)
1000.0000 mL | Freq: Once | INTRAVENOUS | Status: AC
  Administered 2018-09-27: 17:00:00 1000 mL via INTRAVENOUS

## 2018-09-27 MED ORDER — SODIUM CHLORIDE 0.9 % IV BOLUS (SEPSIS): 500.0000 mL | Freq: Once | INTRAVENOUS | Status: DC

## 2018-09-27 MED ORDER — DEXTROSE 5 % IV SOLN
0.5000 g | Freq: Three times a day (TID) | INTRAVENOUS | Status: DC
  Filled 2018-09-27 (×2): qty 0.5

## 2018-09-27 MED ORDER — SODIUM CHLORIDE 0.9 % IV BOLUS (SEPSIS): 1000.0000 mL | Freq: Once | INTRAVENOUS | Status: DC

## 2018-09-27 MED ORDER — VANCOMYCIN HCL IN DEXTROSE 750-5 MG/150ML-% IV SOLN: 750.0000 mg | INTRAVENOUS | Status: DC

## 2018-09-27 NOTE — Progress Notes (Addendum)
Pharmacy Antibiotic Note  Kelsey Leonard is a 63 y.o. female admitted on 09/27/2018 with sepsis presenting with respiratory failure and hypotension. PMH significant for CKD, and MRSA bacteremia with completion of IV daptomycin on 09/27/18. Afebrile, WBC 5.5. Significant allergy to PCNs. Received Flagyl 500 mg IV once in the ED. Pharmacy has been consulted for Aztreonam and Vancomycin dosing.  Plan: Aztreonam 2 g IV once Vancomycin 2 g IV once Aztreonam 500 mg IV q8h Vancomycin 750 mg IV q48h (estAUC 452, goal AUC 400-550, Scr used 3.41) Monitor renal function Monitor C/s   Height: 5\' 2"  (157.5 cm) Weight: 230 lb (104.3 kg) IBW/kg (Calculated) : 50.1  Temp (24hrs), Avg:98.4 F (36.9 C), Min:98.4 F (36.9 C), Max:98.4 F (36.9 C)  Recent Labs  Lab 09/27/18 1545 09/27/18 1557  WBC  --  5.5  CREATININE 3.41*  --   LATICACIDVEN  --  1.4    Estimated Creatinine Clearance: 19.9 mL/min (A) (by C-G formula based on SCr of 3.41 mg/dL (H)).    Allergies  Allergen Reactions  . Aspirin Nausea And Vomiting  . Penicillins Hives    Has patient had a PCN reaction causing immediate rash, facial/tongue/throat swelling, SOB or lightheadedness with hypotension: Yes Has patient had a PCN reaction causing severe rash involving mucus membranes or skin necrosis: Yes Has patient had a PCN reaction that required hospitalization: Yes Has patient had a PCN reaction occurring within the last 10 years: No If all of the above answers are "NO", then may proceed with Cephalosporin use.   . Shellfish Allergy Other (See Comments)  . Sulfa Antibiotics Rash    Antimicrobials this admission: Flagyl 9/9 >> Aztreonam 9/9 >> Vancomycin 9/9 >>  Microbiology results: 9/9 BCx: pending   Thank you for allowing pharmacy to be a part of this patient's care.  Lorel Monaco, PharmD PGY1 Ambulatory Care Resident [employer] # [phone]

## 2018-09-27 NOTE — ED Provider Notes (Signed)
[facility] EMERGENCY DEPARTMENT Provider Note   CSN: [id number] Arrival date & time: 09/27/18  1509     History   Chief Complaint Chief Complaint  Patient presents with  . Hypotension    HPI Kelsey Leonard is a 63 y.o. female hx of CKD, endocarditis, MRSA bacteremia, here with hypotension. Patient was admitted to [facility] then transferred to ICU and intubated. Patient was diagnosed with MRSA bacteremia and just finished IV daptomycin yesterday. Patient was seen by ID clinic today and was noted to be hypotensive to the 80s. Patient states that she is weak all over. She denies fevers at home. Denies trouble breathing or urinary symptoms. Patient sent by ID for evaluation.        The history is provided by the patient.    Past Medical History:  Diagnosis Date  . Asthma   . CKD (chronic kidney disease)   . Depression   . DVT (deep venous thrombosis) ([place])   . 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 GERD (gastroesophageal reflux disease)   . Hypertension   . Hypothyroidism   . Low blood pressure 09/27/2018  . Osteoarthritis   . Schizophrenia ([place])   . Seizures [facility])     Patient Active Problem List   Diagnosis Date Noted  . Low blood pressure 09/27/2018  . 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 AKI (acute kidney injury) ([place]) 08/16/2018  . Bacteremia due to Staphylococcus aureus 08/16/2018  . Acute metabolic encephalopathy [id number]  . CKD (chronic kidney disease), stage IV ([place]) 08/14/2018  . Asthma 08/14/2018  . Respiratory failure ([place]) 08/12/2018    Past Surgical History:  Procedure Laterality Date  . ABDOMINAL HYSTERECTOMY    . IR FLUORO GUIDE CV LINE RIGHT  08/24/2018  . IR US GUIDE VASC ACCESS RIGHT  08/24/2018  . TEE WITHOUT CARDIOVERSION N/A 08/22/2018   Procedure: TRANSESOPHAGEAL ECHOCARDIOGRAM (TEE);  Surgeon: Lelon Perla, MD;  Location: [facility] ENDOSCOPY;  Service: Cardiovascular;  Laterality:  N/A;  . THYROIDECTOMY, PARTIAL       OB History   No obstetric history on file.      Home Medications    Prior to Admission medications   Medication Sig Start Date End Date Taking? Authorizing Provider  acetaminophen (TYLENOL) 500 MG tablet Take 1,000 mg by mouth every 6 (six) hours as needed for mild pain or moderate pain.   Yes [provider]  apixaban (ELIQUIS) 2.5 MG TABS tablet Take 2.5 mg by mouth 2 (two) times daily.   Yes [provider]  benztropine (COGENTIN) 1 MG tablet Take 1 mg by mouth 2 (two) times daily.   Yes [provider]  Iloperidone (FANAPT) 6 MG TABS Take 6 mg by mouth daily.   Yes [provider]  Multiple Vitamins-Minerals (CENTRUM SILVER ADULT 50+) TABS Take 1 tablet by mouth daily.   Yes [provider]  sertraline (ZOLOFT) 50 MG tablet Take 50 mg by mouth daily.   Yes [provider]  solifenacin (VESICARE) 5 MG tablet Take 5 mg by mouth daily.   Yes [provider]    Family History Family History  Problem Relation Age of Onset  . Hypertension Mother   . Hypertension Sister   . Diabetes Sister   . Hypertension Brother   . Diabetes Brother     Social History Social History   Tobacco Use  . Smoking status: Never Smoker  . Smokeless tobacco: Never  Used  Substance Use Topics  . Alcohol use: Never    Frequency: Never  . Drug use: Never     Allergies   Aspirin, Penicillins, Shellfish allergy, and Sulfa antibiotics   Review of Systems Review of Systems  Neurological: Positive for weakness.  All other systems reviewed and are negative.    Physical Exam Updated Vital Signs BP 139/71   Pulse 73   Resp (!) 21   Ht 5\' 2"  (1.575 m)   Wt 104.3 kg   SpO2 92%   BMI 42.07 kg/m   Physical Exam Vitals signs and nursing note reviewed.  Constitutional:      Comments: Chronically ill   HENT:     Head: Normocephalic.     Nose: Nose normal.     Mouth/Throat:     Mouth:  Mucous membranes are moist.  Eyes:     Extraocular Movements: Extraocular movements intact.     Pupils: Pupils are equal, round, and reactive to light.  Neck:     Musculoskeletal: Normal range of motion.  Cardiovascular:     Rate and Rhythm: Normal rate and regular rhythm.     Comments: R chest IJ catheter with no obvious erythema  Pulmonary:     Effort: Pulmonary effort is normal.     Breath sounds: Normal breath sounds.  Abdominal:     General: Abdomen is flat.     Palpations: Abdomen is soft.  Musculoskeletal: Normal range of motion.  Skin:    General: Skin is warm.     Capillary Refill: Capillary refill takes less than 2 seconds.  Neurological:     General: No focal deficit present.     Mental Status: She is oriented to person, place, and time.  Psychiatric:        Mood and Affect: Mood normal.      ED Treatments / Results  Labs (all labs ordered are listed, but only abnormal results are displayed) Labs Reviewed  COMPREHENSIVE METABOLIC PANEL - Abnormal; Notable for the following components:      Result Value   Glucose, Bld 107 (*)    BUN 58 (*)    Creatinine, Ser 3.41 (*)    Albumin 3.0 (*)    GFR calc non Af Amer 14 (*)    GFR calc Af Amer 16 (*)    All other components within normal limits  CBC WITH DIFFERENTIAL/PLATELET - Abnormal; Notable for the following components:   Hemoglobin 11.5 (*)    RDW 17.6 (*)    Platelets 102 (*)    All other components within normal limits  URINALYSIS, ROUTINE W REFLEX MICROSCOPIC - Abnormal; Notable for the following components:   APPearance HAZY (*)    Hgb urine dipstick LARGE (*)    Protein, ur 100 (*)    Bacteria, UA RARE (*)    All other components within normal limits  TROPONIN I (HIGH SENSITIVITY) - Abnormal; Notable for the following components:   Troponin I (High Sensitivity) 101 (*)    All other components within normal limits  CULTURE, BLOOD (ROUTINE X 2)  CULTURE, BLOOD (ROUTINE X 2)  CULTURE, BLOOD (ROUTINE  X 2)  CULTURE, BLOOD (ROUTINE X 2)  URINE CULTURE  SARS CORONAVIRUS 2 (HOSPITAL ORDER, [facility])  LACTIC ACID, PLASMA  LACTIC ACID, PLASMA  TROPONIN I (HIGH SENSITIVITY)    EKG EKG Interpretation  Date/Time:  Wednesday September 27 2018 15:31:18 EDT Ventricular Rate:  65 PR Interval:  166 QRS  Duration: 80 QT Interval:  416 QTC Calculation: 432 R Axis:   69 Text Interpretation:  Normal sinus rhythm Possible Left atrial enlargement Borderline ECG No significant change since last tracing Confirmed by Wandra Arthurs [phone]) on 09/27/2018 4:01:23 PM   Radiology Dg Chest 2 View  Result Date: 09/27/2018 CLINICAL DATA:  Hypotension EXAM: CHEST - 2 VIEW COMPARISON:  August 19, 2018 chest radiograph and chest CT January 09, 2016 FINDINGS: Central catheter tip is in the superior vena cava. No pneumothorax. There is no edema or consolidation. Heart is upper normal in size. There is prominence in the right perihilar region. It is difficult by radiography to ascertain whether this prominence is due to vascular prominence versus potential adenopathy. Prior CT suggests that there may be a degree of pulmonary arterial hypertension. No bone lesions. IMPRESSION: No edema or consolidation. Prominence in the main pulmonary artery region, primarily on the right. While a degree of pulmonary arterial hypertension is suspected, there may be superimposed adenopathy. This radiographic appearance is felt to warrant correlation with chest CT, ideally with intravenous contrast, to assess for potential adenopathy. Heart size within normal limits. Electronically Signed   By: Lowella Grip III M.D.   On: 09/27/2018 16:11    Procedures Procedures (including critical care time)  CRITICAL CARE Performed by: Wandra Arthurs   Total critical care time: 30  minutes  Critical care time was exclusive of separately billable procedures and treating other patients.  Critical care was necessary to  treat or prevent imminent or life-threatening deterioration.  Critical care was time spent personally by me on the following activities: development of treatment plan with patient and/or surrogate as well as nursing, discussions with consultants, evaluation of patient's response to treatment, examination of patient, obtaining history from patient or surrogate, ordering and performing treatments and interventions, ordering and review of laboratory studies, ordering and review of radiographic studies, pulse oximetry and re-evaluation of patient's condition.   Medications Ordered in ED Medications  sodium chloride 0.9 % bolus 1,000 mL (1,000 mLs Intravenous New Bag/Given 09/27/18 1646)    And  sodium chloride 0.9 % bolus 1,000 mL (1,000 mLs Intravenous New Bag/Given 09/27/18 1646)    And  sodium chloride 0.9 % bolus 1,000 mL (has no administration in time range)    And  sodium chloride 0.9 % bolus 500 mL (has no administration in time range)  aztreonam (AZACTAM) 2 g in sodium chloride 0.9 % 100 mL IVPB (has no administration in time range)  metroNIDAZOLE (FLAGYL) IVPB 500 mg (has no administration in time range)  vancomycin (VANCOCIN) 2,000 mg in sodium chloride 0.9 % 500 mL IVPB (has no administration in time range)  vancomycin (VANCOCIN) IVPB 750 mg/150 ml premix (has no administration in time range)  aztreonam (AZACTAM) 0.5 g in dextrose 5 % 50 mL IVPB (has no administration in time range)     Initial Impression / Assessment and Plan / ED Course  I have reviewed the triage vital signs and the nursing notes.  Pertinent labs & imaging results that were available during my care of the patient were reviewed by me and considered in my medical decision making (see chart for details).       Kelsey Leonard is a 63 y.o. female here with hypotension. Patient hypotensive 84/54 in the ID office. Complains of weakness and just finished daptomycin yesterday for MRSA bacteremia. Consider recurrent  sepsis. Code sepsis initiated. Will get labs, lactate, cultures, CXR, UA, COVID.   8:26  PM COVID negative. WBC normal, Lactate nl. BP up to 114/67 after 2 L NS bolus. Given broad spectrum abx with vanc/aztreonam/flagyl. I discussed with Dr. Prince Rome from ID and he recommend continue with vanc/cefepime and call if blood cultures are positive    Final Clinical Impressions(s) / ED Diagnoses   Final diagnoses:  None    ED Discharge Orders    None       Drenda Freeze, MD 09/27/18 [phone]

## 2018-09-27 NOTE — ED Triage Notes (Signed)
Hr bradying down to the 30s in triage. In and out of bigeminy. Pt asymptomatic but does appear lethargic.

## 2018-09-27 NOTE — ED Triage Notes (Signed)
Pt arrives POV for eval of hypotension at MD office. Per MD note, pt has completed round of IV abx, but BP has trended down since last visit. MD concerned w/ lower BP reads, sent here for further eval. Central line to R chest, appears to have come out a little bit since placement. Dsg reinforced.  BP 106/43 in triage

## 2018-09-27 NOTE — ED Notes (Signed)
Pt Sp02 in the 80's, pt notified this RN she is on 2L [place] chronically.  Placed pt on 2L.

## 2018-09-27 NOTE — ED Notes (Signed)
Patient transported to CT 

## 2018-09-27 NOTE — Progress Notes (Signed)
Sending patient to Kelsey Leonard with family member per Dr Tommy Medal as she is hypotensive, needs assessment/fluids. RN called [facility] Charge nurse to let them know. Patient and her sister agreed. They would prefer to drive there themselves. Landis Gandy, RN

## 2018-09-27 NOTE — ED Notes (Signed)
Pt going to xray then to room

## 2018-09-27 NOTE — ED Notes (Addendum)
Patient transported to X-ray 

## 2018-09-27 NOTE — Progress Notes (Addendum)
Subjective:   Chief complaint: Patient had a low blood pressure on arrival   Patient ID: Kelsey Leonard, female    DOB: 10-Aug-1955, 63 y.o.   MRN: [id number]  HPI   63 y.o. female with  MRSA bacteremia and respiratory failure requiring intubation. TEE revealed no obvious vegetations however there is mention of "mitral valve stranding." It is possible that this is non-infectious in nature however concerning in the setting of bacteremia. She seems to be improving on daptomycin with improved mentation. She had a bran MRI on 7/26 indicating no acute finding that would be concerning for embolic sequelae of the stranding/infection but more chronic small vessel ischemia and possible PRES  She is just completing her course of daptomycin today.  She presents to clinic for follow-up today.  She is accompanied by her sister who states that they have not left the home after returning from being hospitalized.  Arrival here her systolic and diastolic blood pressure were abruptly lower than when last measured in the outpatient setting.  She was a bit slow with her responses to my questions and year to week.  She did not have any specific complaint other than some chronic arthritic pains in her knees.  She says her breathing is better  She denied reduced oral intake she is on a diuretic.  She has a central line that was placed to administer IV daptomycin in the dressing has not been changed for the past 10 days.  I also do not have labs on her except for the beginning of August.     Past Medical History:  Diagnosis Date  . Asthma   . CKD (chronic kidney disease)   . Depression   . DVT (deep venous thrombosis) ([place])   . 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 GERD (gastroesophageal reflux disease)   . Hypertension   . Hypothyroidism   . Low blood pressure 09/27/2018  . Osteoarthritis   . Schizophrenia ([place])   . Seizures ([place])     Past Surgical History:  Procedure Laterality Date  .  ABDOMINAL HYSTERECTOMY    . IR FLUORO GUIDE CV LINE RIGHT  08/24/2018  . IR US GUIDE VASC ACCESS RIGHT  08/24/2018  . TEE WITHOUT CARDIOVERSION N/A 08/22/2018   Procedure: TRANSESOPHAGEAL ECHOCARDIOGRAM (TEE);  Surgeon: Lelon Perla, MD;  Location: [facility] ENDOSCOPY;  Service: Cardiovascular;  Laterality: N/A;  . THYROIDECTOMY, PARTIAL      Family History  Problem Relation Age of Onset  . Hypertension Mother   . Hypertension Sister   . Diabetes Sister   . Hypertension Brother   . Diabetes Brother       Social History   Socioeconomic History  . Marital status: Single    Spouse name: Not on file  . Number of children: Not on file  . Years of education: Not on file  . Highest education level: Not on file  Occupational History  . Not on file  Social Needs  . Financial resource strain: Not on file  . Food insecurity    Worry: Not on file    Inability: Not on file  . Transportation needs    Medical: Not on file    Non-medical: Not on file  Tobacco Use  . Smoking status: Never Smoker  . Smokeless tobacco: Never Used  Substance and Sexual Activity  . Alcohol use: Never    Frequency: Never  . Drug use: Never  . Sexual activity: Not Currently  Lifestyle  . Physical  activity    Days per week: Not on file    Minutes per session: Not on file  . Stress: Not on file  Relationships  . Social Herbalist on phone: Not on file    Gets together: Not on file    Attends religious service: Not on file    Active member of club or organization: Not on file    Attends meetings of clubs or organizations: Not on file    Relationship status: Not on file  Other Topics Concern  . Not on file  Social History Narrative  . Not on file    Allergies  Allergen Reactions  . Aspirin Nausea And Vomiting  . Penicillins Hives    Has patient had a PCN reaction causing immediate rash, facial/tongue/throat swelling, SOB or lightheadedness with hypotension: Yes Has patient had a PCN  reaction causing severe rash involving mucus membranes or skin necrosis: Yes Has patient had a PCN reaction that required hospitalization: Yes Has patient had a PCN reaction occurring within the last 10 years: No If all of the above answers are "NO", then may proceed with Cephalosporin use.   . Shellfish Allergy Other (See Comments)  . Sulfa Antibiotics Rash     Current Outpatient Medications:  .  apixaban (ELIQUIS) 2.5 MG TABS tablet, Take 2.5 mg by mouth 2 (two) times daily., Disp: , Rfl:  .  Iloperidone (FANAPT) 6 MG TABS, Take 6 mg by mouth daily., Disp: , Rfl:  .  Multiple Vitamins-Minerals (CENTRUM SILVER ADULT 50+) TABS, Take 1 tablet by mouth daily., Disp: , Rfl:  .  sertraline (ZOLOFT) 50 MG tablet, Take 50 mg by mouth daily., Disp: , Rfl:  .  solifenacin (VESICARE) 5 MG tablet, Take 5 mg by mouth daily., Disp: , Rfl:  .  benztropine (COGENTIN) 1 MG tablet, Take 1 mg by mouth 2 (two) times daily., Disp: , Rfl:    Review of Systems  Constitutional: Negative for activity change, appetite change, chills, diaphoresis, fatigue, fever and unexpected weight change.  HENT: Negative for congestion, rhinorrhea, sinus pressure, sneezing, sore throat and trouble swallowing.   Eyes: Negative for photophobia and visual disturbance.  Respiratory: Negative for cough, chest tightness, shortness of breath, wheezing and stridor.   Cardiovascular: Negative for chest pain, palpitations and leg swelling.  Gastrointestinal: Negative for abdominal distention, abdominal pain, anal bleeding, blood in stool, constipation, diarrhea, nausea and vomiting.  Genitourinary: Negative for difficulty urinating, dysuria, flank pain and hematuria.  Musculoskeletal: Positive for arthralgias. Negative for back pain, gait problem, joint swelling and myalgias.  Skin: Negative for color change, pallor, rash and wound.  Neurological: Positive for weakness. Negative for dizziness, tremors and light-headedness.   Hematological: Negative for adenopathy. Does not bruise/bleed easily.  Psychiatric/Behavioral: Positive for decreased concentration. Negative for agitation, behavioral problems, confusion, dysphoric mood and sleep disturbance.       Objective:   Physical Exam Constitutional:      General: She is not in acute distress.    Appearance: She is not diaphoretic.  HENT:     Head: Normocephalic and atraumatic.     Right Ear: External ear normal.     Left Ear: External ear normal.     Nose: Nose normal.     Mouth/Throat:     Pharynx: No oropharyngeal exudate.  Eyes:     General: No scleral icterus.    Conjunctiva/sclera: Conjunctivae normal.     Pupils: Pupils are equal, round, and reactive to light.  Neck:     Musculoskeletal: Normal range of motion and neck supple.  Cardiovascular:     Rate and Rhythm: Regular rhythm. Bradycardia present.     Heart sounds: Murmur present. Systolic murmur present with a grade of 2/6. No friction rub. No gallop.   Pulmonary:     Effort: Pulmonary effort is normal. No respiratory distress.     Breath sounds: Normal breath sounds. No stridor. No wheezing, rhonchi or rales.  Abdominal:     General: Abdomen is flat. Bowel sounds are normal. There is no distension.     Palpations: Abdomen is soft.     Tenderness: There is no abdominal tenderness. There is no rebound.  Musculoskeletal: Normal range of motion.        General: No tenderness.  Lymphadenopathy:     Cervical: No cervical adenopathy.  Skin:    General: Skin is warm and dry.     Coloration: Skin is not pale.     Findings: No erythema or rash.  Neurological:     Mental Status: She is alert.     Coordination: Coordination normal.  Psychiatric:        Mood and Affect: Mood and affect normal.        Speech: Speech is delayed.        Behavior: Behavior normal.        Thought Content: Thought content normal.        Cognition and Memory: She exhibits impaired recent memory.    Central line  pictured 09/27/2018:           Assessment & Plan:   MRSA bacteremia patient with chronic kidney disease who presented with respiratory failure found to have some stranding on the mitral valve on transesophageal echocardiogram  She has completed her IV antibiotics but I am concerned by her overall appearance today she is dropped her systolic and diastolic blood pressures significantly since last having been seen the outpatient world without a change in weight.  I am sufficiently concerned that she is potentially deteriorating I would like to have her evaluated in the ER.  Perhaps this could be a combination of not eating enough foods and over diuresis with her Lasix but given her recent history of Staphylococcus aureus bacteremia I would feel better repeating blood cultures working her up and giving her fluids in the ER at minimum with thought that she might potentially require hospitalization.  We spent greater than 40 minutes with the patient including greater than 50% of time in face to face counsel of the patient and her sister with regards to the work-up of her weakness and low blood pressure, history of MRSA bacteremia and possible causes of her current's clinical picture and in coordination  of her care

## 2018-09-28 ENCOUNTER — Encounter (HOSPITAL_COMMUNITY): Payer: Self-pay | Admitting: General Practice

## 2018-09-28 DIAGNOSIS — I959 Hypotension, unspecified: Principal | ICD-10-CM

## 2018-09-28 DIAGNOSIS — D696 Thrombocytopenia, unspecified: Secondary | ICD-10-CM | POA: Insufficient documentation

## 2018-09-28 DIAGNOSIS — I10 Essential (primary) hypertension: Secondary | ICD-10-CM

## 2018-09-28 DIAGNOSIS — Z8659 Personal history of other mental and behavioral disorders: Secondary | ICD-10-CM | POA: Insufficient documentation

## 2018-09-28 DIAGNOSIS — J45901 Unspecified asthma with (acute) exacerbation: Secondary | ICD-10-CM

## 2018-09-28 DIAGNOSIS — Z86718 Personal history of other venous thrombosis and embolism: Secondary | ICD-10-CM

## 2018-09-28 DIAGNOSIS — J441 Chronic obstructive pulmonary disease with (acute) exacerbation: Secondary | ICD-10-CM

## 2018-09-28 DIAGNOSIS — A419 Sepsis, unspecified organism: Secondary | ICD-10-CM | POA: Insufficient documentation

## 2018-09-28 DIAGNOSIS — R7881 Bacteremia: Secondary | ICD-10-CM

## 2018-09-28 DIAGNOSIS — N184 Chronic kidney disease, stage 4 (severe): Secondary | ICD-10-CM

## 2018-09-28 LAB — COMPREHENSIVE METABOLIC PANEL
ALT: 11 U/L (ref 0–44)
AST: 25 U/L (ref 15–41)
Albumin: 2.7 g/dL — ABNORMAL LOW (ref 3.5–5.0)
Alkaline Phosphatase: 65 U/L (ref 38–126)
Anion gap: 7 (ref 5–15)
BUN: 52 mg/dL — ABNORMAL HIGH (ref 6–20)
CO2: 23 mmol/L (ref 22–32)
Calcium: 9 mg/dL (ref 8.9–10.3)
Chloride: 112 mmol/L — ABNORMAL HIGH (ref 98–111)
Creatinine, Ser: 2.93 mg/dL — ABNORMAL HIGH (ref 0.44–1.00)
GFR calc Af Amer: 19 mL/min — ABNORMAL LOW (ref >60)
GFR calc non Af Amer: 17 mL/min — ABNORMAL LOW (ref >60)
Glucose, Bld: 94 mg/dL (ref 70–99)
Potassium: 4.8 mmol/L (ref 3.5–5.1)
Sodium: 142 mmol/L (ref 135–145)
Total Bilirubin: 0.5 mg/dL (ref 0.3–1.2)
Total Protein: 6.3 g/dL — ABNORMAL LOW (ref 6.5–8.1)

## 2018-09-28 LAB — CBC
HCT: 37.7 % (ref 36.0–46.0)
Hemoglobin: 11.5 g/dL — ABNORMAL LOW (ref 12.0–15.0)
MCH: 28.8 pg (ref 26.0–34.0)
MCHC: 30.5 g/dL (ref 30.0–36.0)
MCV: 94.5 fL (ref 80.0–100.0)
Platelets: 96 10*3/uL — ABNORMAL LOW (ref 150–400)
RBC: 3.99 MIL/uL (ref 3.87–5.11)
RDW: 18.2 % — ABNORMAL HIGH (ref 11.5–15.5)
WBC: 4.8 10*3/uL (ref 4.0–10.5)
nRBC: 0 % (ref 0.0–0.2)

## 2018-09-28 LAB — URINE CULTURE

## 2018-09-28 LAB — HIV ANTIBODY (ROUTINE TESTING W REFLEX): HIV Screen 4th Generation wRfx: NONREACTIVE

## 2018-09-28 MED ORDER — SODIUM CHLORIDE 0.9 % IV SOLN
2.0000 g | INTRAVENOUS | Status: DC
  Administered 2018-09-28: 2 g via INTRAVENOUS
  Filled 2018-09-28 (×2): qty 2

## 2018-09-28 MED ORDER — APIXABAN 2.5 MG PO TABS
2.5000 mg | ORAL_TABLET | Freq: Two times a day (BID) | ORAL | Status: DC
  Administered 2018-09-28 – 2018-10-02 (×9): 2.5 mg via ORAL
  Filled 2018-09-28 (×12): qty 1

## 2018-09-28 MED ORDER — ILOPERIDONE 6 MG PO TABS
9.0000 mg | ORAL_TABLET | Freq: Every day | ORAL | Status: DC
  Administered 2018-09-28 – 2018-10-01 (×4): 9 mg via ORAL
  Filled 2018-09-28 (×5): qty 2

## 2018-09-28 MED ORDER — IPRATROPIUM-ALBUTEROL 0.5-2.5 (3) MG/3ML IN SOLN
3.0000 mL | Freq: Two times a day (BID) | RESPIRATORY_TRACT | Status: DC
  Administered 2018-09-29 – 2018-09-30 (×3): 3 mL via RESPIRATORY_TRACT
  Filled 2018-09-28 (×3): qty 3

## 2018-09-28 MED ORDER — SERTRALINE HCL 50 MG PO TABS
50.0000 mg | ORAL_TABLET | Freq: Every day | ORAL | Status: DC
  Administered 2018-09-28 – 2018-10-02 (×5): 50 mg via ORAL
  Filled 2018-09-28 (×6): qty 1

## 2018-09-28 MED ORDER — ILOPERIDONE 6 MG PO TABS: 6.0000 mg | ORAL_TABLET | Freq: Every day | ORAL | Status: DC

## 2018-09-28 MED ORDER — SODIUM CHLORIDE 0.9 % IV SOLN
INTRAVENOUS | Status: DC
  Administered 2018-09-28: 18:00:00 via INTRAVENOUS

## 2018-09-28 MED ORDER — IPRATROPIUM-ALBUTEROL 0.5-2.5 (3) MG/3ML IN SOLN
3.0000 mL | RESPIRATORY_TRACT | Status: DC
  Administered 2018-09-28 (×3): 3 mL via RESPIRATORY_TRACT
  Filled 2018-09-28 (×3): qty 3

## 2018-09-28 NOTE — Progress Notes (Signed)
Bradycardia 54-56, with rare unifocal pvc's. bp stable. Denies pain or sob. Dr. Cathlean Sauer notified.

## 2018-09-28 NOTE — Progress Notes (Signed)
Pharmacy Antibiotic Note  Kelsey Leonard is a 63 y.o. female admitted on 09/27/2018 with sepsis presenting with respiratory failure and hypotension. PMH significant for CKD, and MRSA bacteremia with completion of IV daptomycin on 09/27/18. Afebrile, WBC 5.5. Significant allergy to PCNs. Received Flagyl 500 mg IV once in the ED. Pharmacy has been consulted for Aztreonam and Vancomycin dosing.  9/10 AM update: changing Aztreonam to Cefepime  Plan: Already on vancomycin  DC Aztreonam  Cefepime 2g IV q24h Trend WBC, temp, renal function  F/U cultures   Height: 5\' 2"  (157.5 cm) Weight: 230 lb (104.3 kg) IBW/kg (Calculated) : 50.1  Temp (24hrs), Avg:98.4 F (36.9 C), Min:98.4 F (36.9 C), Max:98.4 F (36.9 C)  Recent Labs  Lab 09/27/18 1545 09/27/18 1557 09/27/18 1735  WBC  --  5.5  --   CREATININE 3.41*  --   --   LATICACIDVEN  --  1.4 1.1    Estimated Creatinine Clearance: 19.9 mL/min (A) (by C-G formula based on SCr of 3.41 mg/dL (H)).    Allergies  Allergen Reactions  . Aspirin Nausea And Vomiting  . Penicillins Hives    Has patient had a PCN reaction causing immediate rash, facial/tongue/throat swelling, SOB or lightheadedness with hypotension: Yes Has patient had a PCN reaction causing severe rash involving mucus membranes or skin necrosis: Yes Has patient had a PCN reaction that required hospitalization: Yes Has patient had a PCN reaction occurring within the last 10 years: No If all of the above answers are "NO", then may proceed with Cephalosporin use.   . Shellfish Allergy Other (See Comments)  . Sulfa Antibiotics Rash    Narda Bonds, PharmD, BCPS Clinical Pharmacist Phone: [phone]

## 2018-09-28 NOTE — ED Notes (Signed)
Dr. Cathlean Sauer paged to [place], RN paged by Levada Dy

## 2018-09-28 NOTE — Progress Notes (Signed)
Contact isolation initiated per md order. Patient has recent history of MRSA

## 2018-09-28 NOTE — ED Notes (Signed)
Lunch Tray Ordered @ [id number].

## 2018-09-28 NOTE — ED Notes (Signed)
Pt still sleeping since 2330

## 2018-09-28 NOTE — ED Notes (Signed)
Family at bedside. 

## 2018-09-28 NOTE — ED Notes (Signed)
Tele   Breakfast ordered  

## 2018-09-28 NOTE — Progress Notes (Signed)
PROGRESS NOTE    Kelsey Leonard  [id number] DOB: 03-26-1955 DOA: 09/27/2018 PCP: [place], [name], MD    Brief Narrative:  63 year old female who presented with generalized malaise.  She does have significant past medical history for MRSA bacteremia, chronic kidney disease stage V, deep vein thrombosis, seizures, hypertension, asthma and schizophrenia.  Patient had a prolonged hospital course in July -August 2020 with acute on chronic hypoxic respiratory failure, COPD exacerbation required mechanical ventilation.  She was found to have MRSA bacteremia with negative TEE.  She was discharged with IV daptomycin to complete of September 5.  Since her discharge patient continued to have generalized weakness, decreased appetite, fatigue and lack of energy.  Positive dyspnea for last 2 days, predominantly on exertion. She was seen at the outpatient infections disease clinic, her systolic blood pressure was 80, she was sent to the hospital for further evaluation. On her initial physical examination blood pressure was 134/105, heart rate 60, respiratory rate 27, oxygen saturation 100%.  She had moist mucous membranes, her lungs had diffuse wheezing bilaterally, no rales, heart S1-S2 present and rhythmic,  3/6 systolic murmur, no gallops, her abdomen tender to palpation right upper quadrant, no lower extremity edema. Sodium 140, potassium 4.6, chloride 106, bicarb 25, glucose 107, BUN 58, creatinine 3.4, troponin 101, white count 5.5, hemoglobin 9.5 hematocrit 37.0, platelets 102.  SARS COVID-19 was negative.  Urinalysis negative for infection.  Chest x-ray with hyperinflation, right base atelectasis.  CT chest with bibasilar atelectasis, positive emphysema, CT of the abdomen negative for acute changes.  EKG 65 bpm, normal axis, normal intervals, sinus rhythm, no ST segment or T wave changes.  Patient was admitted to the hospital working diagnosis of hypotension, rule out aspiration pneumonia complicated by  sepsis.  Assessment & Plan:   Principal Problem:   Hypotension Active Problems:   CKD (chronic kidney disease), stage IV (HCC)   Asthma   Bacteremia due to Staphylococcus aureus   Essential hypertension   Sepsis ([place])   1. Transient hypotension. Systolic blood pressure [id number] mmHg, HR has been in the mid 60's. Patient with no current respiratory symptoms, imaging with no frank infiltrate. No leukocytosis. Completed antibiotic therapy with daptomycin on 09.05. She reports diarrhea at home but none since admission. Patient has received 2 L of normal saline on admission. No signs of active infection, will dc antibiotic therapy and continue close monitoring. Remove central line. Pneumonia ruled out.   2. AKI on CKD stage IV. Suspected pre-renal due to volume loss, diarrhea. Renal function this am with serum cr down to 2,9 from 3,4, K at 4,8 and serum bicarbonate at 23, will continue saline at 50 ml per H. Follow on renal panel in am.   3. COPD. No signs of acute exacerbation, continue with as needed bronchodilators.  4. Depression/ schizophrenia. Continue sertraline.   5. Obesity. Calculated BMI is 42,0.   6. Hx of DVT. Continue anticoagulation with apixaban.   DVT prophylaxis: heparin   Code Status: full Family Communication: no family at the bedside  Disposition Plan/ discharge barriers: pending clinical improvement.   Body mass index is 42.07 kg/m. Malnutrition Type:      Malnutrition Characteristics:      Nutrition Interventions:     RN Pressure Injury Documentation:     Consultants:     Procedures:     Antimicrobials:       Subjective: Patient is feeling better, but not yet back to baseline, continue to have generalized weakness, no  nausea or vomiting, denies any dyspnea or cough.   Objective: Vitals:   09/28/18 1217 09/28/18 1300 09/28/18 1409 09/28/18 1454  BP:  (!) 109/59 110/64   Pulse: 61 65 60   Resp: 18 20 18 20   Temp:   97.9 F (36.6 C)    TempSrc:   Oral   SpO2: 99% 97% 99% 99%  Weight:      Height:        Intake/Output Summary (Last 24 hours) at 09/28/2018 1602 Last data filed at 09/28/2018 0413 Gross per 24 hour  Intake 2100 ml  Output 1000 ml  Net 1100 ml   Filed Weights   09/27/18 1534 09/28/18 0803  Weight: 104.3 kg 104.3 kg    Examination:   General: Not in pain or dyspnea, deconditioned  Neurology: Awake and alert, non focal  E ENT: mild pallor, no icterus, oral mucosa moist Cardiovascular: No JVD. S1-S2 present, rhythmic, no gallops, rubs, or murmurs. No lower extremity edema. Pulmonary: positive breath sounds bilaterally, adequate air movement, no wheezing, rhonchi or rales. Gastrointestinal. Abdomen with no organomegaly, non tender, no rebound or guarding Skin. No rashes Musculoskeletal: no joint deformities     Data Reviewed: I have personally reviewed following labs and imaging studies  CBC: Recent Labs  Lab 09/27/18 1557 09/28/18 0443  WBC 5.5 4.8  NEUTROABS 2.8  --   HGB 11.5* 11.5*  HCT 37.0 37.7  MCV 92.5 94.5  PLT 102* 96*   Basic Metabolic Panel: Recent Labs  Lab 09/27/18 1545 09/28/18 0443  NA 140 142  K 4.6 4.8  CL 106 112*  CO2 25 23  GLUCOSE 107* 94  BUN 58* 52*  CREATININE 3.41* 2.93*  CALCIUM 9.4 9.0   GFR: Estimated Creatinine Clearance: 23.1 mL/min (A) (by C-G formula based on SCr of 2.93 mg/dL (H)). Liver Function Tests: Recent Labs  Lab 09/27/18 1545 09/28/18 0443  AST 27 25  ALT 15 11  ALKPHOS 72 65  BILITOT 0.6 0.5  PROT 6.8 6.3*  ALBUMIN 3.0* 2.7*   No results for input(s): LIPASE, AMYLASE in the last 168 hours. No results for input(s): AMMONIA in the last 168 hours. Coagulation Profile: No results for input(s): INR, PROTIME in the last 168 hours. Cardiac Enzymes: No results for input(s): CKTOTAL, CKMB, CKMBINDEX, TROPONINI in the last 168 hours. BNP (last 3 results) No results for input(s): PROBNP in the last 8760 hours. HbA1C: No  results for input(s): HGBA1C in the last 72 hours. CBG: No results for input(s): GLUCAP in the last 168 hours. Lipid Profile: No results for input(s): CHOL, HDL, LDLCALC, TRIG, CHOLHDL, LDLDIRECT in the last 72 hours. Thyroid Function Tests: No results for input(s): TSH, T4TOTAL, FREET4, T3FREE, THYROIDAB in the last 72 hours. Anemia Panel: No results for input(s): VITAMINB12, FOLATE, FERRITIN, TIBC, IRON, RETICCTPCT in the last 72 hours.    Radiology Studies: I have reviewed all of the imaging during this hospital visit personally     Scheduled Meds: . apixaban  2.5 mg Oral BID  . Iloperidone  6 mg Oral Daily  . ipratropium-albuterol  3 mL Nebulization BID  . sertraline  50 mg Oral Daily   Continuous Infusions: . ceFEPime (MAXIPIME) IV Stopped (09/28/18 0530)  . [START ON 09/29/2018] vancomycin       LOS: 1 day        Dannelle Rhymes Gerome Apley, MD

## 2018-09-28 NOTE — ED Notes (Signed)
Pt c/o a headache  She reports that she has been sleepiong good for a few hours  C/o lower back pain

## 2018-09-28 NOTE — Progress Notes (Signed)
Received consult for central line removal. Tunneled catheters are not removed by nursing, PIV started. Nurse notified.

## 2018-09-28 NOTE — Plan of Care (Signed)
  Problem: Safety: Goal: Ability to remain free from injury will improve Outcome: Progressing   Problem: Elimination: Goal: Will not experience complications related to bowel motility Outcome: Progressing   Problem: Skin Integrity: Goal: Risk for impaired skin integrity will decrease Outcome: Progressing   

## 2018-09-28 NOTE — H&P (Signed)
History and Physical    Kelsey Leonard [id number] DOB: 11/04/1955 DOA: 09/27/2018  PCP: Martinique, [name], MD  Patient coming from: Home, lives with parents  I have personally briefly reviewed patient's old medical records in [facility]  Chief Complaint: Generalized malaise  HPI: Kelsey Leonard is a 63 y.o. female with medical history significant of MRSA bacteremia, CKD stage IV, DVT on Eliquis, seizures, hypertension, schizophrenia, asthma, nocturnal 2 L oxygen use, who presented to the emergency department at the request of her infectious disease physician for concerns of hypotension down to the 80s during a clinic visit.  Patient recently had a prolonged hospital course in July where she had acute on chronic hypoxic and hypercarbic respiratory failure/COPD exacerbation requiring intubation.  She was found to have MRSA bacteremia with negative TEE on 8/4.  She was discharged with right IJ catheter for 6 weeks of IV daptomycin therapy with the last dose on 9/5.  She was found to have stage IV chronic kidney disease at that admission and has not yet followed up with nephrology outpatient.  Since discharged in August, patient endorsed that she has never fully recover but continued to have generalized malaise, fatigue, weakness and lack of appetite.  She endorses increasing shortness of breath for the past few days worse with exertion and denies a tobacco use history.  Also endorsed dull intermittent chest pain that is new today.  Denies any new cough or viral URI symptoms.  No sick contacts.  She has chronic incontinence and denies any dysuria, increased frequency or urgency.   ED Course: She was afebrile and reportedly had improvement of her blood pressure after 2 L normal saline bolus resuscitation.  CBC showed no leukocytosis but had left shift.  COVID negative.  Urinalysis negative.  CMP showed worsening creatinine of 3.41 from a prior of 3.06 back in August.  Troponin elevated up to  101.  EKG showed normal sinus rhythm, left atrial enlargement and nonspecific T wave changes. CT chest and CT abdomen and pelvis showed nonspecific mild patchy airspace opacity in the left lower lung and right base which could be atelectasis versus infectious etiology.  ED physician initially started vancomycin, aztreonam and Flagyl due to penicillin allergy but later spoke with Dr. Prince Rome from ID and he recommended continuing vancomycin and cefepime.  Review of Systems:  Constitutional: No Weight Change, No Fever ENT/Mouth: No sore throat, No Rhinorrhea Eyes: No Eye Pain, + green Discharge, No Vision Changes Cardiovascular: + Chest Pain, + SOB, No PND, No Dyspnea on Exertion, No Orthopnea, No Claudication, No Edema, No Palpitations Respiratory: No Cough, No Sputum, No Wheezing, No Smoke Exposure, No Dyspnea Gastrointestinal: No Nausea, No Vomiting, No Diarrhea, No Constipation, No Pain Genitourinary: + Urinary Incontinence, No Urgency, No Flank Pain Musculoskeletal: No Arthralgias, No Myalgias Skin: No Skin Lesions, No Pruritus,  Neuro: + Weakness, No Numbness,  No Loss of Consciousness, No Syncope Psych: No Anxiety/Panic, No Depression, decrease appetite Heme/Lymph: No Bruising, No Bleeding  Past Medical History:  Diagnosis Date   Asthma    CKD (chronic kidney disease)    Depression    DVT (deep venous thrombosis) (HCC)    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   GERD (gastroesophageal reflux disease)    Hypertension    Hypothyroidism    Low blood pressure 09/27/2018   Osteoarthritis    Schizophrenia (HCC)    Seizures (HCC)     Past Surgical History:  Procedure  Laterality Date   ABDOMINAL HYSTERECTOMY     IR FLUORO GUIDE CV LINE RIGHT  08/24/2018   IR US GUIDE VASC ACCESS RIGHT  08/24/2018   TEE WITHOUT CARDIOVERSION N/A 08/22/2018   Procedure: TRANSESOPHAGEAL ECHOCARDIOGRAM (TEE);  Surgeon: Lelon Perla, MD;  Location:  [facility] ENDOSCOPY;  Service: Cardiovascular;  Laterality: N/A;   THYROIDECTOMY, PARTIAL       reports that she has never smoked. She has never used smokeless tobacco. She reports that she does not drink alcohol or use drugs.  Allergies  Allergen Reactions   Aspirin Nausea And Vomiting   Penicillins Hives    Has patient had a PCN reaction causing immediate rash, facial/tongue/throat swelling, SOB or lightheadedness with hypotension: Yes Has patient had a PCN reaction causing severe rash involving mucus membranes or skin necrosis: Yes Has patient had a PCN reaction that required hospitalization: Yes Has patient had a PCN reaction occurring within the last 10 years: No If all of the above answers are "NO", then may proceed with Cephalosporin use.    Shellfish Allergy Other (See Comments)   Sulfa Antibiotics Rash    Family History  Problem Relation Age of Onset   Hypertension Mother    Hypertension Sister    Diabetes Sister    Hypertension Brother    Diabetes Brother     Family history reviewed and not pertinent   Prior to Admission medications   Medication Sig Start Date End Date Taking? Authorizing Provider  acetaminophen (TYLENOL) 500 MG tablet Take 1,000 mg by mouth every 6 (six) hours as needed for mild pain or moderate pain.   Yes [provider]  apixaban (ELIQUIS) 2.5 MG TABS tablet Take 2.5 mg by mouth 2 (two) times daily.   Yes [provider]  benztropine (COGENTIN) 1 MG tablet Take 1 mg by mouth 2 (two) times daily.   Yes [provider]  Iloperidone (FANAPT) 6 MG TABS Take 6 mg by mouth daily.   Yes [provider]  Multiple Vitamins-Minerals (CENTRUM SILVER ADULT 50+) TABS Take 1 tablet by mouth daily.   Yes [provider]  sertraline (ZOLOFT) 50 MG tablet Take 50 mg by mouth daily.   Yes [provider]  solifenacin (VESICARE) 5 MG tablet Take 5 mg by mouth daily.   Yes [provider]    Physical  Exam: Vitals:   09/27/18 2100 09/27/18 2115 09/27/18 2130 09/27/18 2145  BP:    (!) 134/105  Pulse: (!) 58 (!) 58 60 (!) 55  Resp: 20 (!) 22 (!) 27 (!) 23  SpO2: 100% 100% 100% 100%  Weight:      Height:        Constitutional: NAD, calm, comfortable Vitals:   09/27/18 2100 09/27/18 2115 09/27/18 2130 09/27/18 2145  BP:    (!) 134/105  Pulse: (!) 58 (!) 58 60 (!) 55  Resp: 20 (!) 22 (!) 27 (!) 23  SpO2: 100% 100% 100% 100%  Weight:      Height:       General: Thin female, alert and nontoxic appearing Eyes: PERRL, lids and conjunctivae normal, mild greenish discharge to medial side of eyes ENMT: Mucous membranes are moist. Posterior pharynx clear of any exudate or lesions.Normal dentition.  Neck: normal, supple, no masses,  Respiratory: Poor aeration with diffuse wheezing.  No crackles. Normal respiratory effort. No accessory muscle use.  96% oxygen saturation on 2 L. Cardiovascular: Regular rate and rhythm, 3 out of 6 systolic murmur but  no rubs / gallops. No extremity edema. 2+ pedal pulses.  Right IJ catheter in place with no signs of infection. Abdomen: Tenderness to palpation of the right upper quadrant.  No masses palpated. No hepatosplenomegaly. Bowel sounds positive.  GU: Foley catheter in place with 600 L of urine output Musculoskeletal: no clubbing / cyanosis. No joint deformity upper and lower extremities. Good ROM, no contractures. Normal muscle tone.  Left lower extremity nonpitting edema-reportedly chronic likely secondary to DVT. Skin: no rashes, lesions, ulcers. No induration Neurologic: CN 2-12 grossly intact. Sensation intact, DTR normal. Strength 5/5 in all 4.  Psychiatric: Normal judgment and insight. Alert and oriented x 3. Normal mood.    Labs on Admission: I have personally reviewed following labs and imaging studies  CBC: Recent Labs  Lab 09/27/18 1557  WBC 5.5  NEUTROABS 2.8  HGB 11.5*  HCT 37.0  MCV 92.5  PLT [id number]*   Basic Metabolic  Panel: Recent Labs  Lab 09/27/18 1545  NA 140  K 4.6  CL 106  CO2 25  GLUCOSE 107*  BUN 58*  CREATININE 3.41*  CALCIUM 9.4   GFR: Estimated Creatinine Clearance: 19.9 mL/min (A) (by C-G formula based on SCr of 3.41 mg/dL (H)). Liver Function Tests: Recent Labs  Lab 09/27/18 1545  AST 27  ALT 15  ALKPHOS 72  BILITOT 0.6  PROT 6.8  ALBUMIN 3.0*   No results for input(s): LIPASE, AMYLASE in the last 168 hours. No results for input(s): AMMONIA in the last 168 hours. Coagulation Profile: No results for input(s): INR, PROTIME in the last 168 hours. Cardiac Enzymes: No results for input(s): CKTOTAL, CKMB, CKMBINDEX, TROPONINI in the last 168 hours. BNP (last 3 results) No results for input(s): PROBNP in the last 8760 hours. HbA1C: No results for input(s): HGBA1C in the last 72 hours. CBG: No results for input(s): GLUCAP in the last 168 hours. Lipid Profile: No results for input(s): CHOL, HDL, LDLCALC, TRIG, CHOLHDL, LDLDIRECT in the last 72 hours. Thyroid Function Tests: No results for input(s): TSH, T4TOTAL, FREET4, T3FREE, THYROIDAB in the last 72 hours. Anemia Panel: No results for input(s): VITAMINB12, FOLATE, FERRITIN, TIBC, IRON, RETICCTPCT in the last 72 hours. Urine analysis:    Component Value Date/Time   COLORURINE YELLOW 09/27/2018 1700   APPEARANCEUR HAZY (A) 09/27/2018 1700   LABSPEC 1.012 09/27/2018 1700   PHURINE 5.0 09/27/2018 1700   GLUCOSEU NEGATIVE 09/27/2018 1700   HGBUR LARGE (A) 09/27/2018 1700   BILIRUBINUR NEGATIVE 09/27/2018 1700   KETONESUR NEGATIVE 09/27/2018 1700   PROTEINUR 100 (A) 09/27/2018 1700   NITRITE NEGATIVE 09/27/2018 1700   LEUKOCYTESUR NEGATIVE 09/27/2018 1700    Radiological Exams on Admission: Ct Abdomen Pelvis Wo Contrast  Result Date: 09/27/2018 CLINICAL DATA:  Acute respiratory illness, possible mass on radiograph EXAM: CT CHEST WITHOUT CONTRAST TECHNIQUE: Multidetector CT imaging of the chest was performed following  the standard protocol without IV contrast. COMPARISON:  Radiograph same day FINDINGS: Cardiovascular: There is mild prominence of the main pulmonary artery measuring roughly 3 cm at the level the bifurcation. Aortic atherosclerotic calcifications seen at the aortic arch and descending intrathoracic aorta. There is mild cardiomegaly. A right-sided MediPort catheter seen with the tip at the superior cavoatrial junction. No pericardial effusion. Mediastinum/Nodes: Somewhat limited due to lack of intravenous contrast, there appears to be a 2 cm left pretracheal lymph node present. The trachea and esophagus are grossly unremarkable. Lungs/Pleura: Biapical scarring is seen with subpleural blebs. There is streaky/patchy airspace opacity within  the left lower lung and right lung base. No large airspace consolidation or pleural effusion. Upper abdomen: The visualized portion of the upper abdomen is unremarkable. Musculoskeletal/Chest wall: There is no chest wall mass or suspicious osseous finding. No acute osseous abnormality Lower chest: No hiatal hernia. Hepatobiliary: Although limited due to the lack of intravenous contrast, normal in appearance without gross focal abnormality. The patient is status post cholecystectomy. No biliary ductal dilation. Pancreas:  Unremarkable.  No surrounding inflammatory changes. Spleen: Normal in size. Although limited due to the lack of intravenous contrast, normal in appearance. Adrenals/Urinary Tract: Both adrenal glands appear normal. The kidneys and collecting system appear normal without evidence of urinary tract calculus or hydronephrosis. The bladder is partially decompressed with apparent mild wall thickening. Stomach/Bowel: The stomach, small bowel, and colon are normal in appearance. No inflammatory changes or obstructive findings. Vascular/Lymphatic: There are no enlarged abdominal or pelvic lymph nodes. No significant gross vascular findings are present. Reproductive: Coarsely  calcified exophytic uterine fibroids are present. Other: No evidence of abdominal wall mass or hernia. Musculoskeletal: No acute or significant osseous findings. Degenerative changes are seen in the thoracolumbar spine. IMPRESSION: 1. Mildly prominent main pulmonary arteries which can be suggestive of pulmonary artery hypertension. 2. Nonspecific mild patchy airspace opacity in the left lower lung and right base which could be atelectasis and/or atypical infectious etiology. 3. No acute intra-abdominal or pelvic pathology. Electronically Signed   By: Prudencio Pair M.D.   On: 09/27/2018 20:20   Dg Chest 2 View  Result Date: 09/27/2018 CLINICAL DATA:  Hypotension EXAM: CHEST - 2 VIEW COMPARISON:  August 19, 2018 chest radiograph and chest CT January 09, 2016 FINDINGS: Central catheter tip is in the superior vena cava. No pneumothorax. There is no edema or consolidation. Heart is upper normal in size. There is prominence in the right perihilar region. It is difficult by radiography to ascertain whether this prominence is due to vascular prominence versus potential adenopathy. Prior CT suggests that there may be a degree of pulmonary arterial hypertension. No bone lesions. IMPRESSION: No edema or consolidation. Prominence in the main pulmonary artery region, primarily on the right. While a degree of pulmonary arterial hypertension is suspected, there may be superimposed adenopathy. This radiographic appearance is felt to warrant correlation with chest CT, ideally with intravenous contrast, to assess for potential adenopathy. Heart size within normal limits. Electronically Signed   By: Lowella Grip III M.D.   On: 09/27/2018 16:11   Ct Chest Wo Contrast  Result Date: 09/27/2018 CLINICAL DATA:  Acute respiratory illness, possible mass on radiograph EXAM: CT CHEST WITHOUT CONTRAST TECHNIQUE: Multidetector CT imaging of the chest was performed following the standard protocol without IV contrast. COMPARISON:   Radiograph same day FINDINGS: Cardiovascular: There is mild prominence of the main pulmonary artery measuring roughly 3 cm at the level the bifurcation. Aortic atherosclerotic calcifications seen at the aortic arch and descending intrathoracic aorta. There is mild cardiomegaly. A right-sided MediPort catheter seen with the tip at the superior cavoatrial junction. No pericardial effusion. Mediastinum/Nodes: Somewhat limited due to lack of intravenous contrast, there appears to be a 2 cm left pretracheal lymph node present. The trachea and esophagus are grossly unremarkable. Lungs/Pleura: Biapical scarring is seen with subpleural blebs. There is streaky/patchy airspace opacity within the left lower lung and right lung base. No large airspace consolidation or pleural effusion. Upper abdomen: The visualized portion of the upper abdomen is unremarkable. Musculoskeletal/Chest wall: There is no chest wall mass or suspicious  osseous finding. No acute osseous abnormality Lower chest: No hiatal hernia. Hepatobiliary: Although limited due to the lack of intravenous contrast, normal in appearance without gross focal abnormality. The patient is status post cholecystectomy. No biliary ductal dilation. Pancreas:  Unremarkable.  No surrounding inflammatory changes. Spleen: Normal in size. Although limited due to the lack of intravenous contrast, normal in appearance. Adrenals/Urinary Tract: Both adrenal glands appear normal. The kidneys and collecting system appear normal without evidence of urinary tract calculus or hydronephrosis. The bladder is partially decompressed with apparent mild wall thickening. Stomach/Bowel: The stomach, small bowel, and colon are normal in appearance. No inflammatory changes or obstructive findings. Vascular/Lymphatic: There are no enlarged abdominal or pelvic lymph nodes. No significant gross vascular findings are present. Reproductive: Coarsely calcified exophytic uterine fibroids are present. Other:  No evidence of abdominal wall mass or hernia. Musculoskeletal: No acute or significant osseous findings. Degenerative changes are seen in the thoracolumbar spine. IMPRESSION: 1. Mildly prominent main pulmonary arteries which can be suggestive of pulmonary artery hypertension. 2. Nonspecific mild patchy airspace opacity in the left lower lung and right base which could be atelectasis and/or atypical infectious etiology. 3. No acute intra-abdominal or pelvic pathology. Electronically Signed   By: Prudencio Pair M.D.   On: 09/27/2018 20:20    EKG: Independently reviewed. EKG showed normal sinus rhythm, left atrial enlargement and nonspecific T wave changes.  Assessment/Plan   Hypotension -suspect secondary to sepsis secondary to questionable pneumonia although unconvincing based on CT chest and physical findings. - Patient found to be hypotensive in outpatient ID clinic with systolic down to [id number].  This improved with IV fluid resuscitation in the ED. -Patient was recently diagnosed with MRSA bacteremia with no definitive source.  TEE on 8/4 was negative for endocarditis but did note mild MV stranding but no vegetations. - Previously on IV daptomycin until 9/5 per ID - Patient has penicillin allergy but ED physician has spoken with Dr. Prince Rome from ID who recommended continuing vancomycin and cefepime -ID to see in the morning.  Blood cultures pending. - Consider V/Q scan pending clinical course given hx of DVT   Thrombocytopenia - Stable and no active signs of bleeding.  Patient is on Eliquis for DVT. - Suspect possibly due to sepsis -Continue follow-up of CBC  COPD exacerbation -Diffuse wheezing on exam - Will give duo nebs every 4 HRS  Acute on chronic stage IV chronic kidney disease - Will repeat BMP in the morning especially after fluid resuscitation - Has not yet followed up with nephrology per recommendation from last hospitalization  Elevated troponin/chest pain - Troponin is stable  from prior admission and most likely due to chronic kidney disease -Suspect noncardiac cause of chest pain.  EKG was normal sinus rhythm with nonspecific T wave changes.  Right upper quadrant pain -Found to have right upper quadrant pain on exam - LFTs are normal.  No significant findings on CT abdomen - continue to monitor.  History of hypertension - Presented with hypotension in the setting of suspected sepsis - Hold home BP meds  History of left leg DVT -Continue outpatient Eliquis  History of schizophrenia -Continue home medication  History of depression -Continue sertraline      DVT prophylaxis: Eliquis Code Status: Full Family Communication: No family at bedside  disposition Plan: Home.  Anticipate at least 2 midnight inpatient due to severe sepsis. Consults called: ED physician is already spoken with ID Dr. Prince Rome Admission status: Inpatient  Orene Desanctis DO [facility]  If 7PM-7AM, please contact night-coverage [email or web address] Password [email or web address]  09/28/2018, 1:37 AM

## 2018-09-29 ENCOUNTER — Observation Stay (HOSPITAL_COMMUNITY): Payer: Medicaid Other

## 2018-09-29 ENCOUNTER — Encounter (HOSPITAL_COMMUNITY): Payer: Self-pay | Admitting: Physician Assistant

## 2018-09-29 HISTORY — PX: IR REMOVAL TUN CV CATH W/O FL: IMG2289

## 2018-09-29 LAB — CBC WITH DIFFERENTIAL/PLATELET
Abs Immature Granulocytes: 0.01 K/uL (ref 0.00–0.07)
Basophils Absolute: 0 K/uL (ref 0.0–0.1)
Basophils Relative: 0 %
Eosinophils Absolute: 0.3 K/uL (ref 0.0–0.5)
Eosinophils Relative: 6 %
HCT: 34.2 % — ABNORMAL LOW (ref 36.0–46.0)
Hemoglobin: 10.3 g/dL — ABNORMAL LOW (ref 12.0–15.0)
Immature Granulocytes: 0 %
Lymphocytes Relative: 27 %
Lymphs Abs: 1.4 K/uL (ref 0.7–4.0)
MCH: 28.5 pg (ref 26.0–34.0)
MCHC: 30.1 g/dL (ref 30.0–36.0)
MCV: 94.5 fL (ref 80.0–100.0)
Monocytes Absolute: 0.7 K/uL (ref 0.1–1.0)
Monocytes Relative: 13 %
Neutro Abs: 2.9 K/uL (ref 1.7–7.7)
Neutrophils Relative %: 54 %
Platelets: 91 K/uL — ABNORMAL LOW (ref 150–400)
RBC: 3.62 MIL/uL — ABNORMAL LOW (ref 3.87–5.11)
RDW: 18.6 % — ABNORMAL HIGH (ref 11.5–15.5)
WBC: 5.3 K/uL (ref 4.0–10.5)
nRBC: 0 % (ref 0.0–0.2)

## 2018-09-29 LAB — BASIC METABOLIC PANEL WITH GFR
Anion gap: 8 (ref 5–15)
BUN: 45 mg/dL — ABNORMAL HIGH (ref 6–20)
CO2: 21 mmol/L — ABNORMAL LOW (ref 22–32)
Calcium: 8.7 mg/dL — ABNORMAL LOW (ref 8.9–10.3)
Chloride: 113 mmol/L — ABNORMAL HIGH (ref 98–111)
Creatinine, Ser: 2.78 mg/dL — ABNORMAL HIGH (ref 0.44–1.00)
GFR calc Af Amer: 21 mL/min — ABNORMAL LOW (ref >60)
GFR calc non Af Amer: 18 mL/min — ABNORMAL LOW (ref >60)
Glucose, Bld: 83 mg/dL (ref 70–99)
Potassium: 4.8 mmol/L (ref 3.5–5.1)
Sodium: 142 mmol/L (ref 135–145)

## 2018-09-29 MED ORDER — CHLORHEXIDINE GLUCONATE 4 % EX LIQD
CUTANEOUS | Status: AC
  Filled 2018-09-29: qty 15

## 2018-09-29 MED ORDER — LIDOCAINE HCL 1 % IJ SOLN
INTRAMUSCULAR | Status: AC
  Filled 2018-09-29: qty 20

## 2018-09-29 NOTE — Progress Notes (Signed)
PROGRESS NOTE    Kelsey Leonard  [id number] DOB: 02-11-1955 DOA: 09/27/2018 PCP: Martinique, [name], MD    Brief Narrative:  63 year old female who presented with generalized malaise. She does have significant past medical history for MRSA bacteremia, chronic kidney disease stage V, deep vein thrombosis, seizures, hypertension, asthma and schizophrenia. Patient had a prolonged hospital course in July -August2020 with acute on chronic hypoxic respiratory failure, COPD exacerbation required mechanical ventilation. She was found to have MRSA bacteremia with negative TEE. She was discharged with IV daptomycin to complete on September 5. Since her discharge patient continued to have generalized weakness, decreased appetite, fatigue and lack of energy. Reports diarrhea at home and positive dyspnea for last 2 days, predominantly on exertion. She was seen at the outpatient infections disease clinic, her systolic blood pressure was 80, she was sent to the hospital for further evaluation. On her initial physical examination blood pressure was 134/105, heart rate 60, respiratoryrate27, oxygen saturation 100%.She had moist mucous membranes, her lungs had diffuse wheezing bilaterally, no rales, heart S1-S2 present and rhythmic,3/6systolic murmur, no gallops, herabdomen tender to palpation right upper quadrant, no lower extremity edema. Sodium 140, potassium 4.6, chloride 106, bicarb 25, glucose 107, BUN 58, creatinine 3.4, troponin101, white count 5.5, hemoglobin 9.5 hematocrit 37.0, platelets 102.SARS COVID-19 was negative. Urinalysis negative for infection. Chest x-ray with hyperinflation, right base atelectasis. CT chest with bibasilar atelectasis, positive emphysema, CT of the abdomen negative for acute changes. EKG 65 bpm, normal axis, normal intervals, sinus rhythm, no ST segment or T wave changes.  Patient was admitted to the hospital working diagnosis of hypotension, rule out  aspiration pneumonia complicated by sepsis    Assessment & Plan:   Principal Problem:   Hypotension Active Problems:   CKD (chronic kidney disease), stage IV (HCC)   Asthma   Bacteremia due to Staphylococcus aureus   Essential hypertension   Sepsis ([place])    1. Transient hypotension, likely volume related.  Patient was admitted to the telemetry ward, she received intravenous fluids, her blood pressure remained stable, with systolic between [id number] and [id number] mmHg. no further diarrhea, patient tolerating p.o. diet adequately.  Patient was seen by physical therapy, recommendations to discharge to a skilled nursing facility.  Troponin elevation likely related to hypotension in the setting of chronic kidney disease, repeat troponin 96, no acute coronary syndrome.  2.  Acute kidney injury on chronic kidney disease stage IV.  Patient tolerated well intravenous fluids, her discharge creatinine is down to 2.7, potassium 4.8 and serum bicarbonate 21.  Close follow-up of kidney function as an outpatient.  3.  COPD.  No signs of acute exacerbation.  4.  Depression/schizophrenia.  Patient continue with sertraline.  5.  Obesity.  Calculated BMI 42.0  6.  History of DVT/left lower extremity.  Continue anticoagulation with apixaban.  7.  History of MRSA bacteremia.  Patient completed outpatient antibiotic therapy with daptomycin.  Her tunnel central venous catheter will be removed.  8.  Patient ruled out for sepsis/pneumonia on this hospitalization.   DVT prophylaxis: apixaban   Code Status: full Family Communication: no family at the bedside Disposition Plan/ discharge barriers: pending placement SNF  Body mass index is 42.07 kg/m. Malnutrition Type:      Malnutrition Characteristics:      Nutrition Interventions:     RN Pressure Injury Documentation:     Consultants:     Procedures:     Antimicrobials:       Subjective: Patient is feeling  well, no dyspnea  or chest pain, no nausea or vomiting, tolerating po well, no diarrhea.   Objective: Vitals:   09/28/18 1944 09/29/18 0521 09/29/18 0801 09/29/18 1226  BP: (!) 129/48  (!) 149/62 140/87  Pulse: 60 60 64 61  Resp:   14   Temp: 98.3 F (36.8 C) 97.7 F (36.5 C)  98.2 F (36.8 C)  TempSrc: Oral Oral  Oral  SpO2: 100%  100%   Weight:      Height:        Intake/Output Summary (Last 24 hours) at 09/29/2018 1605 Last data filed at 09/29/2018 1228 Gross per 24 hour  Intake 983.17 ml  Output 1300 ml  Net -316.83 ml   Filed Weights   09/27/18 1534 09/28/18 0803  Weight: 104.3 kg 104.3 kg    Examination:   General: Not in pain or dyspnea.  Neurology: Awake and alert, non focal  E ENT: mild pallor, no icterus, oral mucosa moist Cardiovascular: No JVD. S1-S2 present, rhythmic, no gallops, rubs, or murmurs. Left  lower extremity edema/ non pitting. Pulmonary: positive breath sounds bilaterally, adequate air movement, no wheezing, rhonchi or rales. Gastrointestinal. Abdomen with no organomegaly, non tender, no rebound or guarding Skin. No rashes Musculoskeletal: no joint deformities     Data Reviewed: I have personally reviewed following labs and imaging studies  CBC: Recent Labs  Lab 09/27/18 1557 09/28/18 0443 09/29/18 0701  WBC 5.5 4.8 5.3  NEUTROABS 2.8  --  2.9  HGB 11.5* 11.5* 10.3*  HCT 37.0 37.7 34.2*  MCV 92.5 94.5 94.5  PLT 102* 96* 91*   Basic Metabolic Panel: Recent Labs  Lab 09/27/18 1545 09/28/18 0443 09/29/18 0701  NA 140 142 142  K 4.6 4.8 4.8  CL 106 112* 113*  CO2 25 23 21*  GLUCOSE 107* 94 83  BUN 58* 52* 45*  CREATININE 3.41* 2.93* 2.78*  CALCIUM 9.4 9.0 8.7*   GFR: Estimated Creatinine Clearance: 24.4 mL/min (A) (by C-G formula based on SCr of 2.78 mg/dL (H)). Liver Function Tests: Recent Labs  Lab 09/27/18 1545 09/28/18 0443  AST 27 25  ALT 15 11  ALKPHOS 72 65  BILITOT 0.6 0.5  PROT 6.8 6.3*  ALBUMIN 3.0* 2.7*   No results  for input(s): LIPASE, AMYLASE in the last 168 hours. No results for input(s): AMMONIA in the last 168 hours. Coagulation Profile: No results for input(s): INR, PROTIME in the last 168 hours. Cardiac Enzymes: No results for input(s): CKTOTAL, CKMB, CKMBINDEX, TROPONINI in the last 168 hours. BNP (last 3 results) No results for input(s): PROBNP in the last 8760 hours. HbA1C: No results for input(s): HGBA1C in the last 72 hours. CBG: No results for input(s): GLUCAP in the last 168 hours. Lipid Profile: No results for input(s): CHOL, HDL, LDLCALC, TRIG, CHOLHDL, LDLDIRECT in the last 72 hours. Thyroid Function Tests: No results for input(s): TSH, T4TOTAL, FREET4, T3FREE, THYROIDAB in the last 72 hours. Anemia Panel: No results for input(s): VITAMINB12, FOLATE, FERRITIN, TIBC, IRON, RETICCTPCT in the last 72 hours.    Radiology Studies: I have reviewed all of the imaging during this hospital visit personally     Scheduled Meds: . apixaban  2.5 mg Oral BID  . chlorhexidine      . Iloperidone  9 mg Oral QHS  . ipratropium-albuterol  3 mL Nebulization BID  . lidocaine      . sertraline  50 mg Oral Daily   Continuous Infusions: . sodium chloride 50 mL/hr  at 09/29/18 1441     LOS: 2 days        Dannilynn Gallina Gerome Apley, MD

## 2018-09-29 NOTE — NC FL2 (Signed)
[place] LEVEL OF CARE SCREENING TOOL     IDENTIFICATION  Patient Name: Kelsey Leonard Birthdate: 1955/03/29 Sex: female Admission Date (Current Location): 09/27/2018  [facility] and [place] Number:  Herbalist and Address:         Provider Number: [id number]  Attending Physician Name and Address:  Tawni Millers  Relative Name and Phone Number:       Current Level of Care: SNF Recommended Level of Care: [place] Prior Approval Number:    Date Approved/Denied:   PASRR Number:    Discharge Plan: SNF    Current Diagnoses: Patient Active Problem List   Diagnosis Date Noted  . Sepsis ([place]) 09/28/2018  . Thrombocytopenia ([place])   . History of depression   . History of schizophrenia   . History of DVT (deep vein thrombosis)   . Essential hypertension   . COPD with acute exacerbation ([place])   . Low blood pressure 09/27/2018  . 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 Hypotension 09/27/2018  . AKI (acute kidney injury) ([place]) 08/16/2018  . Bacteremia due to Staphylococcus aureus 08/16/2018  . Acute metabolic encephalopathy [id number]  . CKD (chronic kidney disease), stage IV ([place]) 08/14/2018  . Asthma 08/14/2018  . Respiratory failure ([place]) 08/12/2018    Orientation RESPIRATION BLADDER Height & Weight     Self, Time, Situation, Place  O2(2L) Continent Weight: 230 lb (104.3 kg) Height:  5\' 2"  (157.5 cm)  BEHAVIORAL SYMPTOMS/MOOD NEUROLOGICAL BOWEL NUTRITION STATUS      Continent Diet(Diet 2 sodium - thin liquid)  AMBULATORY STATUS COMMUNICATION OF NEEDS Skin   Limited Assist Verbally Normal                       Personal Care Assistance Level of Assistance  Bathing, Feeding, Dressing Bathing Assistance: Limited assistance Feeding assistance: Limited assistance Dressing Assistance: Limited assistance     Functional Limitations Info  Sight, Hearing, Speech Sight Info: Adequate Hearing Info:  Adequate Speech Info: Adequate    SPECIAL CARE FACTORS FREQUENCY  Blood pressure, PT (By licensed PT), OT (By licensed OT)     PT Frequency: 3-5x week OT Frequency: 3-5x week            Contractures Contractures Info: Not present    Additional Factors Info  Code Status, Allergies, Isolation Precautions, Psychotropic Code Status Info: Full Code Allergies Info: Aspirin, Penicillins, Shellfish Allergy, Sulfa Antibiotics Psychotropic Info: Zoloft   Isolation Precautions Info: 08/12/2018 MRSA by surgical PCR     Current Medications (09/29/2018):  This is the current hospital active medication list Current Facility-Administered Medications  Medication Dose Route Frequency Provider Last Rate Last Dose  . chlorhexidine (HIBICLENS) 4 % liquid           . lidocaine (XYLOCAINE) 1 % (with pres) injection           . 0.9 %  sodium chloride infusion   Intravenous Continuous Arrien, Jimmy Picket, MD 50 mL/hr at 09/29/18 1441    . apixaban (ELIQUIS) tablet 2.5 mg  2.5 mg Oral BID Tu, Ching T, DO   2.5 mg at 09/29/18 0905  . Iloperidone TABS 9 mg  9 mg Oral QHS Arrien, Jimmy Picket, MD   9 mg at 09/28/18 2210  . ipratropium-albuterol (DUONEB) 0.5-2.5 (3) MG/3ML nebulizer solution 3 mL  3 mL Nebulization BID Arrien, Jimmy Picket, MD   3 mL at 09/29/18 0801  . sertraline (ZOLOFT) tablet 50 mg  50 mg Oral Daily Tu, Ching T, DO   50 mg at 09/29/18 [id number]     Discharge Medications: Please see discharge summary for a list of discharge medications.  Relevant Imaging Results:  Relevant Lab Results:   Additional Information SSN [id number] - Short term Medicaid - agreeable to 30 day stay prior to return home with parents    Barbette Or, [place]

## 2018-09-29 NOTE — TOC Initial Note (Signed)
Transition of Care [facility]) - Initial/Assessment Note    Patient Details  Name: Kelsey Leonard MRN: [id number] Date of Birth: Dec 29, 1955  [facility] CM/SW Contact:    Toleen Lachapelle, Francetta Found, LCSW Phone Number: 09/29/2018, 3:24 PM  Clinical Narrative:                  Clinical Social Worker spoke with patient over the phone to offer support and discuss patient needs at discharge.  Patient states that she has been declining at home and is agreeable that SNF placement for rehab would be beneficial to her.  Patient currently lives at home with her parents, however they are currently sick and have limited ability to assist patient with ADL's.  Patient does plan to stay the 30 days for full Medicaid coverage and is understanding that if the need transitions to long term she will need to forfeit her SSI check.    Patient is hopeful for placement in either [place] or [place] - CSW to initiate referral and will follow up with patient regarding available bed offers.  Patient PASARR currently pending due to history of Schizophrenia diagnosis.  Patient earliest discharge would be Monday due to [facility] barrier.  CSW remains available for support and will continue to be available to facilitate patient discharge needs.  Expected Discharge Plan: Skilled Nursing Facility Barriers to Discharge: [place] (PASRR), Inadequate or no insurance, SNF Pending Medicaid   Patient Goals and CMS Choice Patient states their goals for this hospitalization and ongoing recovery are:: Patient would like to rehab to go home with independence   Choice offered to / list presented to : Patient  Expected Discharge Plan and Services Expected Discharge Plan: [place] In-house Referral: Clinical Social Work Discharge Planning Services: CM Consult Post Acute Care Choice: [place] Living arrangements for the past 2 months: [place]                                      Prior Living Arrangements/Services Living arrangements for the past 2 months: Single Family Home Lives with:: Parents Patient language and need for interpreter reviewed:: Yes Do you feel safe going back to the place where you live?: Yes      Need for Family Participation in Patient Care: Yes (Comment) Care giver support system in place?: Yes (comment)   Criminal Activity/Legal Involvement Pertinent to Current Situation/Hospitalization: No - Comment as needed  Activities of Daily Living Home Assistive Devices/Equipment: Walker (specify type) ADL Screening (condition at time of admission) Patient's cognitive ability adequate to safely complete daily activities?: No Is the patient deaf or have difficulty hearing?: Yes Does the patient have difficulty seeing, even when wearing glasses/contacts?: No Does the patient have difficulty concentrating, remembering, or making decisions?: Yes Patient able to express need for assistance with ADLs?: Yes Does the patient have difficulty dressing or bathing?: Yes Independently performs ADLs?: Yes (appropriate for developmental age) Does the patient have difficulty walking or climbing stairs?: Yes Weakness of Legs: Both Weakness of Arms/Hands: None  Permission Sought/Granted Permission sought to share information with : Case Manager, Family Supports Permission granted to share information with : Yes, Verbal Permission Granted  Share Information with NAME: Betti Cruz     Permission granted to share info w Relationship: Mother  Permission granted to share info w Contact Information: [phone]  Emotional Assessment Appearance:: Appears older than stated  age Attitude/Demeanor/Rapport: Gracious, Engaged, Inconsistent Affect (typically observed): Pleasant, Hopeful Orientation: : Oriented to Self, Oriented to Place, Oriented to  Time, Oriented to Situation Alcohol / Substance Use: Not Applicable Psych Involvement: No  (comment)(Patient with history only - currently not followed in the community)  Admission diagnosis:  Hypotension, unspecified hypotension type [I95.9] Sepsis ([place]) [A41.9] Patient Active Problem List   Diagnosis Date Noted  . Sepsis ([place]) 09/28/2018  . Thrombocytopenia ([place])   . History of depression   . History of schizophrenia   . History of DVT (deep vein thrombosis)   . Essential hypertension   . COPD with acute exacerbation ([place])   . Low blood pressure 09/27/2018  . 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 Hypotension 09/27/2018  . AKI (acute kidney injury) ([place]) 08/16/2018  . Bacteremia due to Staphylococcus aureus 08/16/2018  . Acute metabolic encephalopathy [id number]  . CKD (chronic kidney disease), stage IV ([place]) 08/14/2018  . Asthma 08/14/2018  . Respiratory failure ([place]) 08/12/2018   PCP:  Martin[place], [name], MD Pharmacy:   [place], [place] [address] Phone: [phone] Fax: [phone]     Social Determinants of Health (SDOH) Interventions    Readmission Risk Interventions No flowsheet data found.

## 2018-09-29 NOTE — Evaluation (Signed)
Physical Therapy Evaluation Patient Details Name: Kelsey Leonard MRN: [id number] DOB: 1955-10-28 Today's Date: 09/29/2018   History of Present Illness  63 y.o. female with medical history significant of MRSA bacteremia, CKD stage IV, DVT on Eliquis, seizures, hypertension, schizophrenia, asthma, nocturnal 2 L oxygen use, who presented to the emergency department at the request of her infectious disease physician for concerns of hypotension down to the 80s during a clinic visit. In ED found to have elevated troponins, and CT found to have airspace in L lower lung and R base. Admitted 09/27/18 for sepsis with respiratory failure and hypotension.   Clinical Impression  PTA pt living with parents in single story home with ramped entrance. Pt reports independence using RW for ambulation, and with her ADLs. Pt reports sister and mother assist with iADLs. Pt is currently limited in safe mobility by back and knee pain in presence of decreased L knee ROM, decreased strength and decreased endurance. PT recommending SNF level rehab to improve safe mobility before going home. PT will continue to follow acutely.     Follow Up Recommendations SNF    Equipment Recommendations  Other (comment)(TBD at next venue)       Precautions / Restrictions Precautions Precautions: Fall Precaution Comments: hx of falling Restrictions Weight Bearing Restrictions: No      Mobility  Bed Mobility Overal bed mobility: Needs Assistance Bed Mobility: Supine to Sit     Supine to sit: Supervision     General bed mobility comments: supervision for safety, HOB elevated and increased effort to pull against bedrail to come to EoB  Transfers Overall transfer level: Needs assistance Equipment used: Rolling walker (2 wheeled) Transfers: Sit to/from Stand Sit to Stand: Min assist         General transfer comment: despite vc pt places both UE on RW and uses increased momentum to come to standing and requires min A for  steadying once in upright  Ambulation/Gait Ambulation/Gait assistance: Min assist Gait Distance (Feet): 18 Feet Assistive device: Rolling walker (2 wheeled) Gait Pattern/deviations: Step-through pattern;Decreased step length - right;Decreased step length - left;Antalgic;Shuffle;Wide base of support Gait velocity: slowed Gait velocity interpretation: <1.31 ft/sec, indicative of household ambulator General Gait Details: minA for steadying with RW, pt ambulates with slow, waddling gait, decreased weightbearing through L LEvc for proximity to RW          Balance Overall balance assessment: Needs assistance Sitting-balance support: Feet supported;No upper extremity supported Sitting balance-Leahy Scale: Fair     Standing balance support: Bilateral upper extremity supported Standing balance-Leahy Scale: Poor                               Pertinent Vitals/Pain Pain Assessment: 0-10 Pain Score: 9  Pain Location: lower back Pain Descriptors / Indicators: Aching;Sore Pain Intervention(s): Limited activity within patient's tolerance;Monitored during session;Repositioned    Home Living Family/patient expects to be discharged to:: Private residence Living Arrangements: Parent(elderly) Available Help at Discharge: Family;Available PRN/intermittently Type of Home: House Home Access: Ramped entrance     Home Layout: One level Home Equipment: Walker - 2 wheels;Bedside commode;Cane - single point      Prior Function Level of Independence: Needs assistance   Gait / Transfers Assistance Needed: utilizes RW  for household distances   ADL's / Homemaking Assistance Needed: independent in ADLs, sister and mother help with iADLs           Extremity/Trunk Assessment  Upper Extremity Assessment Upper Extremity Assessment: Overall WFL for tasks assessed    Lower Extremity Assessment Lower Extremity Assessment: LLE deficits/detail RLE Deficits / Details: WFL RLE  Sensation: WNL RLE Coordination: decreased fine motor LLE Deficits / Details: L knee lacking 5-10 degrees of flexion/extension, with increased pain in end ROM. L LE strength grossly assessed at 3+/5  LLE Sensation: WNL LLE Coordination: decreased fine motor    Cervical / Trunk Assessment Cervical / Trunk Assessment: Kyphotic  Communication   Communication: No difficulties  Cognition Arousal/Alertness: Awake/alert Behavior During Therapy: WFL for tasks assessed/performed Overall Cognitive Status: History of cognitive impairments - at baseline Area of Impairment: Following commands;Safety/judgement;Problem solving;Awareness;Memory                     Memory: Decreased short-term memory Following Commands: Follows multi-step commands with increased time;Follows one step commands with increased time Safety/Judgement: Decreased awareness of safety Awareness: Emergent Problem Solving: Slow processing;Requires verbal cues;Requires tactile cues General Comments: pt requires extra time for processing questions and commands. after ambulation in room with increased effort pt asks "are we going for a walk."      General Comments General comments (skin integrity, edema, etc.): HR 76-112bpm, on 2L O2 via [place] SaO2 93-97%O2, BP 140/72        Assessment/Plan    PT Assessment Patient needs continued PT services  PT Problem List Decreased strength;Decreased range of motion;Decreased activity tolerance;Decreased balance;Decreased mobility;Decreased cognition;Decreased safety awareness;Pain       PT Treatment Interventions DME instruction;Gait training;Functional mobility training;Therapeutic activities;Therapeutic exercise;Balance training;Patient/family education;Cognitive remediation    PT Goals (Current goals can be found in the Care Plan section)  Acute Rehab PT Goals Patient Stated Goal: get stronger PT Goal Formulation: With patient Time For Goal Achievement: 10/13/18 Potential  to Achieve Goals: Fair    Frequency Min 2X/week    AM-PAC PT "6 Clicks" Mobility  Outcome Measure Help needed turning from your back to your side while in a flat bed without using bedrails?: None Help needed moving from lying on your back to sitting on the side of a flat bed without using bedrails?: A Little Help needed moving to and from a bed to a chair (including a wheelchair)?: A Little Help needed standing up from a chair using your arms (e.g., wheelchair or bedside chair)?: A Little Help needed to walk in hospital room?: A Lot Help needed climbing 3-5 steps with a railing? : Total 6 Click Score: 16    End of Session Equipment Utilized During Treatment: Gait belt;Oxygen Activity Tolerance: Patient tolerated treatment well Patient left: in chair;with chair alarm set;with call bell/phone within reach Nurse Communication: Mobility status PT Visit Diagnosis: Unsteadiness on feet (R26.81);Other abnormalities of gait and mobility (R26.89);Muscle weakness (generalized) (M62.81);Difficulty in walking, not elsewhere classified (R26.2);Pain Pain - Right/Left: Left Pain - part of body: Knee(low back)    Time: [id number] PT Time Calculation (min) (ACUTE ONLY): 34 min   Charges:   PT Evaluation $PT Eval Moderate Complexity: 1 Mod PT Treatments $Gait Training: 8-22 mins        Esme Durkin B. Migdalia Dk PT, DPT Acute Rehabilitation Services Pager [phone] Office [phone]   [place] 09/29/2018, 12:47 PM

## 2018-09-30 LAB — BASIC METABOLIC PANEL
Anion gap: 5 (ref 5–15)
Anion gap: 5 (ref 5–15)
BUN: 40 mg/dL — ABNORMAL HIGH (ref 6–20)
BUN: 41 mg/dL — ABNORMAL HIGH (ref 6–20)
CO2: 25 mmol/L (ref 22–32)
CO2: 25 mmol/L (ref 22–32)
Calcium: 9.1 mg/dL (ref 8.9–10.3)
Calcium: 9.2 mg/dL (ref 8.9–10.3)
Chloride: 112 mmol/L — ABNORMAL HIGH (ref 98–111)
Chloride: 108 mmol/L (ref 98–111)
Creatinine, Ser: 2.58 mg/dL — ABNORMAL HIGH (ref 0.44–1.00)
Creatinine, Ser: 2.54 mg/dL — ABNORMAL HIGH (ref 0.44–1.00)
GFR calc Af Amer: 23 mL/min — ABNORMAL LOW (ref >60)
GFR calc Af Amer: 23 mL/min — ABNORMAL LOW (ref >60)
GFR calc non Af Amer: 19 mL/min — ABNORMAL LOW (ref >60)
GFR calc non Af Amer: 20 mL/min — ABNORMAL LOW (ref >60)
Glucose, Bld: 86 mg/dL (ref 70–99)
Glucose, Bld: 125 mg/dL — ABNORMAL HIGH (ref 70–99)
Potassium: 5.5 mmol/L — ABNORMAL HIGH (ref 3.5–5.1)
Potassium: 4.9 mmol/L (ref 3.5–5.1)
Sodium: 142 mmol/L (ref 135–145)
Sodium: 138 mmol/L (ref 135–145)

## 2018-09-30 LAB — POTASSIUM: Potassium: 6.5 mmol/L (ref 3.5–5.1)

## 2018-09-30 LAB — GLUCOSE, CAPILLARY: Glucose-Capillary: 75 mg/dL (ref 70–99)

## 2018-09-30 MED ORDER — DEXTROSE 50 % IV SOLN
1.0000 | Freq: Once | INTRAVENOUS | Status: AC
  Administered 2018-09-30: 18:00:00 50 mL via INTRAVENOUS
  Filled 2018-09-30: qty 50

## 2018-09-30 MED ORDER — INSULIN ASPART 100 UNIT/ML IV SOLN
10.0000 [IU] | Freq: Once | INTRAVENOUS | Status: AC
  Administered 2018-09-30: 18:00:00 10 [IU] via INTRAVENOUS

## 2018-09-30 MED ORDER — SODIUM ZIRCONIUM CYCLOSILICATE 10 G PO PACK
10.0000 g | PACK | Freq: Once | ORAL | Status: AC
  Administered 2018-09-30: 13:00:00 10 g via ORAL
  Filled 2018-09-30: qty 1

## 2018-09-30 MED ORDER — CALCIUM GLUCONATE-NACL 1-0.675 GM/50ML-% IV SOLN
1.0000 g | Freq: Once | INTRAVENOUS | Status: AC
  Administered 2018-09-30: 1000 mg via INTRAVENOUS
  Filled 2018-09-30: qty 50

## 2018-09-30 MED ORDER — ALBUTEROL SULFATE (2.5 MG/3ML) 0.083% IN NEBU: 2.5000 mg | INHALATION_SOLUTION | RESPIRATORY_TRACT | Status: DC | PRN

## 2018-09-30 MED ORDER — SODIUM ZIRCONIUM CYCLOSILICATE 10 G PO PACK
10.0000 g | PACK | Freq: Three times a day (TID) | ORAL | Status: DC
  Administered 2018-09-30 – 2018-10-02 (×6): 10 g via ORAL
  Filled 2018-09-30 (×8): qty 1

## 2018-09-30 NOTE — TOC Progression Note (Signed)
Transition of Care [facility]) - Progression Note    Patient Details  Name: Kelsey Leonard MRN: [id number] Date of Birth: May 23, 1955  [facility] CM/SW [place], LCSW Phone Number: 09/30/2018, 11:42 AM  Clinical Narrative:    CSW uploaded requested clinicals to [facility] for review.    Expected Discharge Plan: Skilled Nursing Facility Barriers to Discharge: [place] (PASRR), Inadequate or no insurance, SNF Pending Medicaid  Expected Discharge Plan and Services Expected Discharge Plan: [place] In-house Referral: Clinical Social Work Discharge Planning Services: CM Consult Post Acute Care Choice: [place] arrangements for the past 2 months: Single Family Home                                       Social Determinants of Health (SDOH) Interventions    Readmission Risk Interventions No flowsheet data found.

## 2018-09-30 NOTE — Discharge Summary (Signed)
Physician Discharge Summary  Kelsey Leonard [id number] DOB: 1955-05-15 DOA: 09/27/2018  PCP: Martinique, [name], MD  Admit date: 09/27/2018 Discharge date: 09/30/2018  Admitted From: Home  Disposition:  Home   Recommendations for Outpatient Follow-up and new medication changes:  1. Follow up with Dr. Martinique in 7 days.  2. Tunneled catheter has been removed. 3. Patient has declined SNF.   Home Health: yes  Equipment/Devices: walker    Discharge Condition: stable  CODE STATUS: full  Diet recommendation: heart healthy   Brief/Interim Summary: 63 year old female who presented with generalized malaise. She does have significant past medical history for MRSA bacteremia, chronic kidney disease stage V, deep vein thrombosis, seizures, hypertension, asthma and schizophrenia. Patient had a prolonged hospital course in July -August2020 with acute on chronic hypoxic respiratory failure, COPD exacerbation required mechanical ventilation. She was found to have MRSA bacteremia with negative TEE. She was discharged with IV daptomycin to complete onSeptember 5. Since her discharge patient continued to have generalized weakness, decreased appetite, fatigue and lack of energy.Reports diarrhea at home and positive dyspnea for last 2 days, predominantly on exertion. She was seen at the outpatient infections disease clinic, her systolic blood pressure was 80, she was sent to the hospital for further evaluation. On her initial physical examination blood pressure was 134/105, heart rate 60, respiratoryrate27, oxygen saturation 100%.She had moist mucous membranes, her lungs had diffuse wheezing bilaterally, no rales, heart S1-S2 present and rhythmic,3/6systolic murmur, no gallops, herabdomen tender to palpation right upper quadrant, no lower extremity edema. Sodium 140, potassium 4.6, chloride 106, bicarb 25, glucose 107, BUN 58, creatinine 3.4, troponin101, white count 5.5, hemoglobin 9.5 hematocrit  37.0, platelets 102.SARS COVID-19 was negative. Urinalysis negative for infection. Chest x-ray with hyperinflation, right base atelectasis. CT chest with bibasilar atelectasis, positive emphysema, CT of the abdomen negative for acute changes. EKG 65 bpm, normal axis, normal intervals, sinus rhythm, no ST segment or Leonard wave changes.  Patient was admitted to the hospital working diagnosis of hypotension, rule out aspiration pneumonia complicated by sepsis  1.  Transient hypotension, likely volume related.  Patient was admitted to the telemetry ward, she was placed on broad spectrum antibiotic therapy and received intravenous fluids.  She regain hemodynamic stability, no further diarrhea.  Infection was ruled out, antibiotics were discontinued.  Her tunnel catheter was removed.  Troponin elevation likely due to hypotension in the setting of chronic kidney disease.  No acute coronary syndrome.  By time of discharge patient is tolerating p.o. diet adequately.  She was seen by physical therapy with recommendations to discharge to a skilled nursing facility but she declined.  Home health services will be arranged.  2.  Acute kidney injury currently is stage IV with hyperkalemia..  Patient received intravenous fluids, her serum creatinine discharge is 2.5, serum bicarbonate 25.  Patient will receive sodium troponin before discharge, for a potassium of 5.5.  3.  COPD.  Stable with no signs of exacerbation.  4.  Depression/schizophrenia.  Continue sertraline.  5.  Obesity.  Calculated BMI 42.  6.  History of DVT/left lower extremity deep vein thrombosis.  Continue anticoagulation with apixaban, dose modification per renal function.  7.  History of MRSA bacteremia.  Patient has completed antibiotic therapy as an outpatient, her tunneled central venous catheter was removed.  8.  Patient rule out for sepsis/pneumonia during this hospitalization.  Discharge Diagnoses:  Principal Problem:    Hypotension Active Problems:   CKD (chronic kidney disease), stage IV ([place])  Asthma   Bacteremia due to Staphylococcus aureus   Essential hypertension   Sepsis [facility])    Discharge Instructions  Discharge Instructions    Diet - low sodium heart healthy   Complete by: As directed    Discharge instructions   Complete by: As directed    Please follow with primary care in 7 days.   Increase activity slowly   Complete by: As directed      Allergies as of 09/30/2018      Reactions   Aspirin Nausea And Vomiting   Penicillins Hives   Has patient had a PCN reaction causing immediate rash, facial/tongue/throat swelling, SOB or lightheadedness with hypotension: Yes Has patient had a PCN reaction causing severe rash involving mucus membranes or skin necrosis: Yes Has patient had a PCN reaction that required hospitalization: Yes Has patient had a PCN reaction occurring within the last 10 years: No If all of the above answers are "NO", then may proceed with Cephalosporin use.   Shellfish Allergy Other (See Comments)   Sulfa Antibiotics Rash      Medication List    TAKE these medications   acetaminophen 500 MG tablet Commonly known as: TYLENOL Take 1,000 mg by mouth every 6 (six) hours as needed for mild pain or moderate pain.   benztropine 1 MG tablet Commonly known as: COGENTIN Take 1 mg by mouth 2 (two) times daily.   Centrum Silver Adult 50+ Tabs Take 1 tablet by mouth daily.   Eliquis 2.5 MG Tabs tablet Generic drug: apixaban Take 2.5 mg by mouth 2 (two) times daily.   Fanapt 6 MG Tabs Generic drug: Iloperidone Take 6 mg by mouth daily.   sertraline 50 MG tablet Commonly known as: ZOLOFT Take 50 mg by mouth daily.   solifenacin 5 MG tablet Commonly known as: VESICARE Take 5 mg by mouth daily.      Follow-up Information    Martinique, [name], MD Follow up in 1 week(s).   Specialty: Family Medicine Contact information: [address] [place]  [id number]          Allergies  Allergen Reactions  . Aspirin Nausea And Vomiting  . Penicillins Hives    Has patient had a PCN reaction causing immediate rash, facial/tongue/throat swelling, SOB or lightheadedness with hypotension: Yes Has patient had a PCN reaction causing severe rash involving mucus membranes or skin necrosis: Yes Has patient had a PCN reaction that required hospitalization: Yes Has patient had a PCN reaction occurring within the last 10 years: No If all of the above answers are "NO", then may proceed with Cephalosporin use.   . Shellfish Allergy Other (See Comments)  . Sulfa Antibiotics Rash    Consultations:     Procedures/Studies: Ct Abdomen Pelvis Wo Contrast  Result Date: 09/27/2018 CLINICAL DATA:  Acute respiratory illness, possible mass on radiograph EXAM: CT CHEST WITHOUT CONTRAST TECHNIQUE: Multidetector CT imaging of the chest was performed following the standard protocol without IV contrast. COMPARISON:  Radiograph same day FINDINGS: Cardiovascular: There is mild prominence of the main pulmonary artery measuring roughly 3 cm at the level the bifurcation. Aortic atherosclerotic calcifications seen at the aortic arch and descending intrathoracic aorta. There is mild cardiomegaly. A right-sided MediPort catheter seen with the tip at the superior cavoatrial junction. No pericardial effusion. Mediastinum/Nodes: Somewhat limited due to lack of intravenous contrast, there appears to be a 2 cm left pretracheal lymph node present. The trachea and esophagus are grossly unremarkable. Lungs/Pleura: Biapical scarring  is seen with subpleural blebs. There is streaky/patchy airspace opacity within the left lower lung and right lung base. No large airspace consolidation or pleural effusion. Upper abdomen: The visualized portion of the upper abdomen is unremarkable. Musculoskeletal/Chest wall: There is no chest wall mass or suspicious osseous finding. No acute osseous  abnormality Lower chest: No hiatal hernia. Hepatobiliary: Although limited due to the lack of intravenous contrast, normal in appearance without gross focal abnormality. The patient is status post cholecystectomy. No biliary ductal dilation. Pancreas:  Unremarkable.  No surrounding inflammatory changes. Spleen: Normal in size. Although limited due to the lack of intravenous contrast, normal in appearance. Adrenals/Urinary Tract: Both adrenal glands appear normal. The kidneys and collecting system appear normal without evidence of urinary tract calculus or hydronephrosis. The bladder is partially decompressed with apparent mild wall thickening. Stomach/Bowel: The stomach, small bowel, and colon are normal in appearance. No inflammatory changes or obstructive findings. Vascular/Lymphatic: There are no enlarged abdominal or pelvic lymph nodes. No significant gross vascular findings are present. Reproductive: Coarsely calcified exophytic uterine fibroids are present. Other: No evidence of abdominal wall mass or hernia. Musculoskeletal: No acute or significant osseous findings. Degenerative changes are seen in the thoracolumbar spine. IMPRESSION: 1. Mildly prominent main pulmonary arteries which can be suggestive of pulmonary artery hypertension. 2. Nonspecific mild patchy airspace opacity in the left lower lung and right base which could be atelectasis and/or atypical infectious etiology. 3. No acute intra-abdominal or pelvic pathology. Electronically Signed   By: Prudencio Pair M.D.   On: 09/27/2018 20:20   Dg Chest 2 View  Result Date: 09/27/2018 CLINICAL DATA:  Hypotension EXAM: CHEST - 2 VIEW COMPARISON:  August 19, 2018 chest radiograph and chest CT January 09, 2016 FINDINGS: Central catheter tip is in the superior vena cava. No pneumothorax. There is no edema or consolidation. Heart is upper normal in size. There is prominence in the right perihilar region. It is difficult by radiography to ascertain whether this  prominence is due to vascular prominence versus potential adenopathy. Prior CT suggests that there may be a degree of pulmonary arterial hypertension. No bone lesions. IMPRESSION: No edema or consolidation. Prominence in the main pulmonary artery region, primarily on the right. While a degree of pulmonary arterial hypertension is suspected, there may be superimposed adenopathy. This radiographic appearance is felt to warrant correlation with chest CT, ideally with intravenous contrast, to assess for potential adenopathy. Heart size within normal limits. Electronically Signed   By: Lowella Grip III M.D.   On: 09/27/2018 16:11   Ct Chest Wo Contrast  Result Date: 09/27/2018 CLINICAL DATA:  Acute respiratory illness, possible mass on radiograph EXAM: CT CHEST WITHOUT CONTRAST TECHNIQUE: Multidetector CT imaging of the chest was performed following the standard protocol without IV contrast. COMPARISON:  Radiograph same day FINDINGS: Cardiovascular: There is mild prominence of the main pulmonary artery measuring roughly 3 cm at the level the bifurcation. Aortic atherosclerotic calcifications seen at the aortic arch and descending intrathoracic aorta. There is mild cardiomegaly. A right-sided MediPort catheter seen with the tip at the superior cavoatrial junction. No pericardial effusion. Mediastinum/Nodes: Somewhat limited due to lack of intravenous contrast, there appears to be a 2 cm left pretracheal lymph node present. The trachea and esophagus are grossly unremarkable. Lungs/Pleura: Biapical scarring is seen with subpleural blebs. There is streaky/patchy airspace opacity within the left lower lung and right lung base. No large airspace consolidation or pleural effusion. Upper abdomen: The visualized portion of the upper abdomen is  unremarkable. Musculoskeletal/Chest wall: There is no chest wall mass or suspicious osseous finding. No acute osseous abnormality Lower chest: No hiatal hernia. Hepatobiliary:  Although limited due to the lack of intravenous contrast, normal in appearance without gross focal abnormality. The patient is status post cholecystectomy. No biliary ductal dilation. Pancreas:  Unremarkable.  No surrounding inflammatory changes. Spleen: Normal in size. Although limited due to the lack of intravenous contrast, normal in appearance. Adrenals/Urinary Tract: Both adrenal glands appear normal. The kidneys and collecting system appear normal without evidence of urinary tract calculus or hydronephrosis. The bladder is partially decompressed with apparent mild wall thickening. Stomach/Bowel: The stomach, small bowel, and colon are normal in appearance. No inflammatory changes or obstructive findings. Vascular/Lymphatic: There are no enlarged abdominal or pelvic lymph nodes. No significant gross vascular findings are present. Reproductive: Coarsely calcified exophytic uterine fibroids are present. Other: No evidence of abdominal wall mass or hernia. Musculoskeletal: No acute or significant osseous findings. Degenerative changes are seen in the thoracolumbar spine. IMPRESSION: 1. Mildly prominent main pulmonary arteries which can be suggestive of pulmonary artery hypertension. 2. Nonspecific mild patchy airspace opacity in the left lower lung and right base which could be atelectasis and/or atypical infectious etiology. 3. No acute intra-abdominal or pelvic pathology. Electronically Signed   By: Prudencio Pair M.D.   On: 09/27/2018 20:20   Ir Removal Tun Cv Cath W/o Fl  Result Date: 09/29/2018 INDICATION: Bacteremia. Request for removal of tunneled central venous catheter. EXAM: REMOVAL OF TUNNELED CENTRAL VENOUS CATHETER MEDICATIONS: 1% lidocaine 1 mL ANESTHESIA/SEDATION: No sedation medication needed. COMPLICATIONS: None immediate. PROCEDURE: Informed written consent was obtained from the patient following an explanation of the procedure, risks, benefits and alternatives to treatment. A time out was  performed prior to the initiation of the procedure. Maximal barrier sterile technique was utilized including caps, mask, sterile gowns, sterile gloves, large sterile drape, hand hygiene, and ChloraPrep. 1% lidocaine with epinephrine was injected under sterile conditions along the subcutaneous tunnel. Utilizing a combination of blunt dissection and gentle traction, the catheter was removed intact. Hemostasis was obtained with manual compression. A dressing was placed. The patient tolerated the procedure well without immediate post procedural complication. IMPRESSION: Successful removal of tunneled central venous catheter. Read by: Gareth Eagle, PA-C Electronically Signed   By: Corrie Mckusick D.O.   On: 09/29/2018 14:49      Procedures:  Removal of tunneled central venous catheter.   Subjective:   Discharge Exam: Vitals:   09/30/18 0533 09/30/18 0724  BP: 118/62   Pulse: 61   Resp:    Temp: 98.2 F (36.8 C)   SpO2:  100%   Vitals:   09/29/18 2015 09/29/18 2215 09/30/18 0533 09/30/18 0724  BP: (!) 121/53 122/66 118/62   Pulse:   61   Resp: (!) 23 20    Temp: 98.9 F (37.2 C)  98.2 F (36.8 C)   TempSrc: Oral  Oral   SpO2: 100% 100%  100%  Weight:      Height:        General: Not in pain or dyspnea.  Neurology: Awake and alert, non focal  E ENT: no pallor, no icterus, oral mucosa moist Cardiovascular: No JVD. S1-S2 present, rhythmic, no gallops, rubs, or murmurs. Trave lower extremity edema, more left than right. Pulmonary: positive breath sounds bilaterally, adequate air movement, no wheezing, rhonchi or rales. Gastrointestinal. Abdomen with no organomegaly, non tender, no rebound or guarding Skin. No rashes Musculoskeletal: no joint deformities   The  results of significant diagnostics from this hospitalization (including imaging, microbiology, ancillary and laboratory) are listed below for reference.     Microbiology: Recent Results (from the past 240 hour(s))  Culture,  blood (Routine x 2)     Status: None (Preliminary result)   Collection Time: 09/27/18  3:35 PM   Specimen: BLOOD  Result Value Ref Range Status   Specimen Description BLOOD RIGHT ANTECUBITAL  Final   Special Requests   Final    BOTTLES DRAWN AEROBIC ONLY Blood Culture results may not be optimal due to an inadequate volume of blood received in culture bottles   Culture   Final    NO GROWTH 3 DAYS Performed at [facility], [place] [address] [place], [place] [address]    Report Status PENDING  Incomplete  SARS Coronavirus 2 [facility] order, Performed in [facility] hospital lab) Nasopharyngeal Nasopharyngeal Swab     Status: None   Collection Time: 09/27/18  3:59 PM   Specimen: Nasopharyngeal Swab  Result Value Ref Range Status   SARS Coronavirus 2 NEGATIVE NEGATIVE Final    Comment: (NOTE) If result is NEGATIVE SARS-CoV-2 target nucleic acids are NOT DETECTED. The SARS-CoV-2 RNA is generally detectable in upper and lower  respiratory specimens during the acute phase of infection. The lowest  concentration of SARS-CoV-2 viral copies this assay can detect is 250  copies / mL. A negative result does not preclude SARS-CoV-2 infection  and should not be used as the sole basis for treatment or other  patient management decisions.  A negative result may occur with  improper specimen collection / handling, submission of specimen other  than nasopharyngeal swab, presence of viral mutation(s) within the  areas targeted by this assay, and inadequate number of viral copies  (<250 copies / mL). A negative result must be combined with clinical  observations, patient history, and epidemiological information. If result is POSITIVE SARS-CoV-2 target nucleic acids are DETECTED. The SARS-CoV-2 RNA is generally detectable in upper and lower  respiratory specimens dur ing the acute phase of infection.  Positive  results are indicative of active infection with SARS-CoV-2.  Clinical  correlation  with patient history and other diagnostic information is  necessary to determine patient infection status.  Positive results do  not rule out bacterial infection or co-infection with other viruses. If result is PRESUMPTIVE POSTIVE SARS-CoV-2 nucleic acids MAY BE PRESENT.   A presumptive positive result was obtained on the submitted specimen  and confirmed on repeat testing.  While 2019 novel coronavirus  (SARS-CoV-2) nucleic acids may be present in the submitted sample  additional confirmatory testing may be necessary for epidemiological  and / or clinical management purposes  to differentiate between  SARS-CoV-2 and other Sarbecovirus currently known to infect humans.  If clinically indicated additional testing with an alternate test  methodology [phone]) is advised. The SARS-CoV-2 RNA is generally  detectable in upper and lower respiratory sp ecimens during the acute  phase of infection. The expected result is Negative. Fact Sheet for Patients:  StrictlyIdeas.no Fact Sheet for Healthcare Providers: BankingDealers.co.za This test is not yet approved or cleared by the [place] FDA and has been authorized for detection and/or diagnosis of SARS-CoV-2 by FDA under an Emergency Use Authorization (EUA).  This EUA will remain in effect (meaning this test can be used) for the duration of the COVID-19 declaration under Section 564(b)(1) of the Act, 21 U.S.C. section 360bbb-3(b)(1), unless the authorization is terminated or revoked sooner. Performed at [facility], [place] [address], [place], [place] [address]   Urine culture     Status: None   Collection Time: 09/27/18  5:07 PM   Specimen: In/Out Cath Urine  Result Value Ref Range Status   Specimen Description IN/OUT CATH URINE  Final   Special Requests   Final    NONE Performed at [facility], [place] [address] [place], [place] [address]    Culture   Final    Multiple  bacterial morphotypes present, none predominant. Suggest appropriate recollection if clinically indicated.   Report Status 09/28/2018 FINAL  Final  Culture, blood (Routine x 2)     Status: None (Preliminary result)   Collection Time: 09/27/18  5:36 PM   Specimen: BLOOD RIGHT HAND  Result Value Ref Range Status   Specimen Description BLOOD RIGHT HAND  Final   Special Requests   Final    BOTTLES DRAWN AEROBIC ONLY Blood Culture results may not be optimal due to an inadequate volume of blood received in culture bottles   Culture   Final    NO GROWTH 3 DAYS Performed at [facility], [place] [address] [place], [place] [address]    Report Status PENDING  Incomplete     Labs: BNP (last 3 results) No results for input(s): BNP in the last 8760 hours. Basic Metabolic Panel: Recent Labs  Lab 09/27/18 1545 09/28/18 0443 09/29/18 0701 09/30/18 0716  NA 140 142 142 142  K 4.6 4.8 4.8 5.5*  CL 106 112* 113* 112*  CO2 25 23 21* 25  GLUCOSE 107* 94 83 86  BUN 58* 52* 45* 40*  CREATININE 3.41* 2.93* 2.78* 2.58*  CALCIUM 9.4 9.0 8.7* 9.1   Liver Function Tests: Recent Labs  Lab 09/27/18 1545 09/28/18 0443  AST 27 25  ALT 15 11  ALKPHOS 72 65  BILITOT 0.6 0.5  PROT 6.8 6.3*  ALBUMIN 3.0* 2.7*   No results for input(s): LIPASE, AMYLASE in the last 168 hours. No results for input(s): AMMONIA in the last 168 hours. CBC: Recent Labs  Lab 09/27/18 1557 09/28/18 0443 09/29/18 0701  WBC 5.5 4.8 5.3  NEUTROABS 2.8  --  2.9  HGB 11.5* 11.5* 10.3*  HCT 37.0 37.7 34.2*  MCV 92.5 94.5 94.5  PLT 102* 96* 91*   Cardiac Enzymes: No results for input(s): CKTOTAL, CKMB, CKMBINDEX, TROPONINI in the last 168 hours. BNP: Invalid input(s): POCBNP CBG: Recent Labs  Lab 09/30/18 0732  GLUCAP 75   D-Dimer No results for input(s): DDIMER in the last 72 hours. Hgb A1c No results for input(s): HGBA1C in the last 72 hours. Lipid Profile No results for input(s): CHOL, HDL, LDLCALC,  TRIG, CHOLHDL, LDLDIRECT in the last 72 hours. Thyroid function studies No results for input(s): TSH, T4TOTAL, T3FREE, THYROIDAB in the last 72 hours.  Invalid input(s): FREET3 Anemia work up No results for input(s): VITAMINB12, FOLATE, FERRITIN, TIBC, IRON, RETICCTPCT in the last 72 hours. Urinalysis    Component Value Date/Time   COLORURINE YELLOW 09/27/2018 1700   APPEARANCEUR HAZY (A) 09/27/2018 1700   LABSPEC 1.012 09/27/2018 1700   PHURINE 5.0 09/27/2018 1700   GLUCOSEU NEGATIVE 09/27/2018 1700   HGBUR LARGE (A) 09/27/2018 1700   BILIRUBINUR NEGATIVE 09/27/2018 1700   KETONESUR NEGATIVE 09/27/2018 1700   PROTEINUR 100 (A) 09/27/2018 1700   NITRITE NEGATIVE 09/27/2018 1700   LEUKOCYTESUR NEGATIVE 09/27/2018 1700   Sepsis Labs Invalid input(s): PROCALCITONIN,  WBC,  LACTICIDVEN Microbiology Recent Results (  from the past 240 hour(s))  Culture, blood (Routine x 2)     Status: None (Preliminary result)   Collection Time: 09/27/18  3:35 PM   Specimen: BLOOD  Result Value Ref Range Status   Specimen Description BLOOD RIGHT ANTECUBITAL  Final   Special Requests   Final    BOTTLES DRAWN AEROBIC ONLY Blood Culture results may not be optimal due to an inadequate volume of blood received in culture bottles   Culture   Final    NO GROWTH 3 DAYS Performed at [facility], [place] [address] [place], [place] [address]    Report Status PENDING  Incomplete  SARS Coronavirus 2 [facility] order, Performed in [facility] hospital lab) Nasopharyngeal Nasopharyngeal Swab     Status: None   Collection Time: 09/27/18  3:59 PM   Specimen: Nasopharyngeal Swab  Result Value Ref Range Status   SARS Coronavirus 2 NEGATIVE NEGATIVE Final    Comment: (NOTE) If result is NEGATIVE SARS-CoV-2 target nucleic acids are NOT DETECTED. The SARS-CoV-2 RNA is generally detectable in upper and lower  respiratory specimens during the acute phase of infection. The lowest  concentration of SARS-CoV-2  viral copies this assay can detect is 250  copies / mL. A negative result does not preclude SARS-CoV-2 infection  and should not be used as the sole basis for treatment or other  patient management decisions.  A negative result may occur with  improper specimen collection / handling, submission of specimen other  than nasopharyngeal swab, presence of viral mutation(s) within the  areas targeted by this assay, and inadequate number of viral copies  (<250 copies / mL). A negative result must be combined with clinical  observations, patient history, and epidemiological information. If result is POSITIVE SARS-CoV-2 target nucleic acids are DETECTED. The SARS-CoV-2 RNA is generally detectable in upper and lower  respiratory specimens dur ing the acute phase of infection.  Positive  results are indicative of active infection with SARS-CoV-2.  Clinical  correlation with patient history and other diagnostic information is  necessary to determine patient infection status.  Positive results do  not rule out bacterial infection or co-infection with other viruses. If result is PRESUMPTIVE POSTIVE SARS-CoV-2 nucleic acids MAY BE PRESENT.   A presumptive positive result was obtained on the submitted specimen  and confirmed on repeat testing.  While 2019 novel coronavirus  (SARS-CoV-2) nucleic acids may be present in the submitted sample  additional confirmatory testing may be necessary for epidemiological  and / or clinical management purposes  to differentiate between  SARS-CoV-2 and other Sarbecovirus currently known to infect humans.  If clinically indicated additional testing with an alternate test  methodology [phone]) is advised. The SARS-CoV-2 RNA is generally  detectable in upper and lower respiratory sp ecimens during the acute  phase of infection. The expected result is Negative. Fact Sheet for Patients:  StrictlyIdeas.no Fact Sheet for Healthcare  Providers: BankingDealers.co.za This test is not yet approved or cleared by the [place] FDA and has been authorized for detection and/or diagnosis of SARS-CoV-2 by FDA under an Emergency Use Authorization (EUA).  This EUA will remain in effect (meaning this test can be used) for the duration of the COVID-19 declaration under Section 564(b)(1) of the Act, 21 U.S.C. section 360bbb-3(b)(1), unless the authorization is terminated or revoked sooner. Performed at [facility], [place] [address] [place], [place] [address]   Urine culture     Status: None   Collection Time: 09/27/18  5:07 PM   Specimen: In/Out Cath Urine  Result Value Ref Range Status   Specimen Description IN/OUT CATH URINE  Final   Special Requests   Final    NONE Performed at [facility], [address], [place], [place] [address]    Culture   Final    Multiple bacterial morphotypes present, none predominant. Suggest appropriate recollection if clinically indicated.   Report Status 09/28/2018 FINAL  Final  Culture, blood (Routine x 2)     Status: None (Preliminary result)   Collection Time: 09/27/18  5:36 PM   Specimen: BLOOD RIGHT HAND  Result Value Ref Range Status   Specimen Description BLOOD RIGHT HAND  Final   Special Requests   Final    BOTTLES DRAWN AEROBIC ONLY Blood Culture results may not be optimal due to an inadequate volume of blood received in culture bottles   Culture   Final    NO GROWTH 3 DAYS Performed at [facility], [place] [address] [place], [place] [address]    Report Status PENDING  Incomplete     Time coordinating discharge: 45 minutes  SIGNED:   Tawni Millers, MD  [facility] 09/30/2018, 12:22 PM

## 2018-09-30 NOTE — Progress Notes (Addendum)
Patient with persistent and worsening hyperkalemia, cancel discharge. Will continue sodium zirconium and will give 10 units of IV insulin along with dextrose and calcium gluconate. Follow BMP at 20:00.

## 2018-09-30 NOTE — TOC Transition Note (Signed)
Transition of Care [facility]) - CM/SW Discharge Note   Patient Details  Name: Kelsey Leonard MRN: [id number] Date of Birth: 25-Mar-1955  Transition of Care [facility]) CM/SW Contact:  Claudie Leach, RN Phone Number: 09/30/2018, 5:44 PM   Clinical Narrative:    Patient decided today to transition home with mother.  Staff was unable to reach mother for several hours and discharge was delayed due to potassium level.  Home health is arranged with [facility], but will not start for 7-10 days.  I discussed this at length with the patient, her mother, and Dr. Cathlean Sauer.  I offered the possibility of the patient going to SNF until home health services could start. The patient and mother are still in favor of the patient going home and not to a facility.    The patient's [facility] referral was rejected by [place], [place], [facility], [place].    The patient will go home in mother's vehicle and mother will bring portable oxygen tank.  The patient does not need other DME.    Final next level of care: Home w Home Health Services Barriers to Discharge: Family Issues   Patient Goals and CMS Choice Patient states their goals for this hospitalization and ongoing recovery are:: Patient would like to rehab to go home with independence   Choice offered to / list presented to : Patient   Discharge Plan and Services In-house Referral: Clinical Social Work Discharge Planning Services: CM Consult Post Acute Care Choice: Skilled Nursing Facility               HH Arranged: RN, PT, OT, Nurse's Aide [place] Agency: [facility] [employer] Date [place]: 09/30/18 Time [place]: [place] Representative spoke with at [place]: Alwyn Ren

## 2018-10-01 DIAGNOSIS — E875 Hyperkalemia: Secondary | ICD-10-CM

## 2018-10-01 LAB — BASIC METABOLIC PANEL
Anion gap: 6 (ref 5–15)
Anion gap: 10 (ref 5–15)
BUN: 42 mg/dL — ABNORMAL HIGH (ref 6–20)
BUN: 44 mg/dL — ABNORMAL HIGH (ref 6–20)
CO2: 23 mmol/L (ref 22–32)
CO2: 26 mmol/L (ref 22–32)
Calcium: 8.9 mg/dL (ref 8.9–10.3)
Calcium: 9.1 mg/dL (ref 8.9–10.3)
Chloride: 110 mmol/L (ref 98–111)
Chloride: 104 mmol/L (ref 98–111)
Creatinine, Ser: 2.45 mg/dL — ABNORMAL HIGH (ref 0.44–1.00)
Creatinine, Ser: 2.59 mg/dL — ABNORMAL HIGH (ref 0.44–1.00)
GFR calc Af Amer: 24 mL/min — ABNORMAL LOW (ref >60)
GFR calc Af Amer: 22 mL/min — ABNORMAL LOW (ref >60)
GFR calc non Af Amer: 21 mL/min — ABNORMAL LOW (ref >60)
GFR calc non Af Amer: 19 mL/min — ABNORMAL LOW (ref >60)
Glucose, Bld: 96 mg/dL (ref 70–99)
Glucose, Bld: 101 mg/dL — ABNORMAL HIGH (ref 70–99)
Potassium: 5.4 mmol/L — ABNORMAL HIGH (ref 3.5–5.1)
Potassium: 5.2 mmol/L — ABNORMAL HIGH (ref 3.5–5.1)
Sodium: 139 mmol/L (ref 135–145)
Sodium: 140 mmol/L (ref 135–145)

## 2018-10-01 LAB — POTASSIUM: Potassium: 5.8 mmol/L — ABNORMAL HIGH (ref 3.5–5.1)

## 2018-10-01 MED ORDER — FUROSEMIDE 10 MG/ML IJ SOLN
60.0000 mg | Freq: Once | INTRAMUSCULAR | Status: AC
  Administered 2018-10-01: 17:00:00 60 mg via INTRAVENOUS
  Filled 2018-10-01: qty 6

## 2018-10-01 NOTE — Progress Notes (Signed)
PROGRESS NOTE    Kelsey Leonard  [id number] DOB: Jun 12, 1955 DOA: 09/27/2018 PCP: [place], [name], MD    Brief Narrative:  63 year old female who presented with generalized malaise. She does have significant past medical history for MRSA bacteremia, chronic kidney disease stage V, deep vein thrombosis, seizures, hypertension, asthma and schizophrenia. Patient had a prolonged hospital course in July -August2020 with acute on chronic hypoxic respiratory failure, COPD exacerbation required mechanical ventilation. She was found to have MRSA bacteremia with negative TEE. She was discharged with IV daptomycin to complete onSeptember 5. Since her discharge patient continued to have generalized weakness, decreased appetite, fatigue and lack of energy.Reports diarrhea at home and positive dyspnea for last 2 days, predominantly on exertion. She was seen at the outpatient infections disease clinic, her systolic blood pressure was 80, she was sent to the hospital for further evaluation. On her initial physical examination blood pressure was 134/105, heart rate 60, respiratoryrate27, oxygen saturation 100%.She had moist mucous membranes, her lungs had diffuse wheezing bilaterally, no rales, heart S1-S2 present and rhythmic,3/6systolic murmur, no gallops, herabdomen tender to palpation right upper quadrant, no lower extremity edema. Sodium 140, potassium 4.6, chloride 106, bicarb 25, glucose 107, BUN 58, creatinine 3.4, troponin101, white count 5.5, hemoglobin 9.5 hematocrit 37.0, platelets 102.SARS COVID-19 was negative. Urinalysis negative for infection. Chest x-ray with hyperinflation, right base atelectasis. CT chest with bibasilar atelectasis, positive emphysema, CT of the abdomen negative for acute changes. EKG 65 bpm, normal axis, normal intervals, sinus rhythm, no ST segment or T wave changes.  Patient was admitted to the hospital working diagnosis of hypotension, rule out  aspiration pneumonia complicated by sepsis.   Patient ruled out for sepsis or infection, antibiotics were discontinue, blood pressure improved with IV fluids.   Her discharge was cancelled due to worsening hyperkalemia.     Assessment & Plan:   Principal Problem:   Hypotension Active Problems:   CKD (chronic kidney disease), stage IV (HCC)   Asthma   Bacteremia due to Staphylococcus aureus   Essential hypertension   Sepsis ([place])    1. Transient hypotension,likely volume related. Blood pressure 125/80, continue to have no source of infection. Continue to hold on IV fluids for now. Continue blood pressure monitoring.   2.Acute kidney injury on chronic kidney disease stage IV with new hyperkalemia.Her discharge was cancel due to worsening hyperkalemia, up to 6,5 patient received calcium gluconate, IV insulin and dextrose, along with sodium zirconium. Follow K at 4,0 and 5,4. Will continue sodium zirconium 10 mg tid and follow K this pm at 14:00. Renal function stable with serum cr at 2,45, and serum bicarbonate at 23. Follow renal panel in am.   3.COPD.No acute exacerbation.  4.Depression/schizophrenia.On sertraline, patient under the care of her mother and family, they have declined SNF.   5.Obesity.Her calculated BMI is 42.0  6.History of DVT/left lower extremity.Anticoagulation with apixaban, with good toleration.  7.History of MRSA bacteremia.resolved. Tunneled catheter has been removed.   8.Patient ruled out for sepsis/pneumonia on this hospitalization.   DVT prophylaxis: apixaban.   Code Status:  full Family Communication: no family at the bedside  Disposition Plan/ discharge barriers: pending improvement of K   Body mass index is 42.07 kg/m. Malnutrition Type:      Malnutrition Characteristics:      Nutrition Interventions:     RN Pressure Injury Documentation:     Consultants:     Procedures:   Removal of  tunneled catheter.   Antimicrobials:  Subjective: Patient feeling well, no nausea or vomiting, no chest pain or dyspnea, continue to be very weak and deconditioned. Continue to have hyperkalemia.   Objective: Vitals:   09/30/18 2032 10/01/18 0500 10/01/18 0544 10/01/18 0834  BP: 126/71  127/76 125/80  Pulse: 67  63 72  Resp: (!) 26 17  18   Temp: 99.1 F (37.3 C)  98 F (36.7 C) 98 F (36.7 C)  TempSrc: Oral  Oral Axillary  SpO2:  99%  100%  Weight:      Height:        Intake/Output Summary (Last 24 hours) at 10/01/2018 1211 Last data filed at 10/01/2018 0542 Gross per 24 hour  Intake -  Output 2000 ml  Net -2000 ml   Filed Weights   09/27/18 1534 09/28/18 0803  Weight: 104.3 kg 104.3 kg    Examination:   General: deconditioned  Neurology: Awake and alert, non focal  E ENT: mild pallor, no icterus, oral mucosa moist Cardiovascular: No JVD. S1-S2 present, rhythmic, no gallops, rubs, or murmurs. No lower extremity edema. Pulmonary: positive breath sounds bilaterally, adequate air movement, no wheezing, rhonchi or rales. Gastrointestinal. Abdomen with no organomegaly, non tender, no rebound or guarding Skin. No rashes Musculoskeletal: no joint deformities     Data Reviewed: I have personally reviewed following labs and imaging studies  CBC: Recent Labs  Lab 09/27/18 1557 09/28/18 0443 09/29/18 0701  WBC 5.5 4.8 5.3  NEUTROABS 2.8  --  2.9  HGB 11.5* 11.5* 10.3*  HCT 37.0 37.7 34.2*  MCV 92.5 94.5 94.5  PLT 102* 96* 91*   Basic Metabolic Panel: Recent Labs  Lab 09/28/18 0443 09/29/18 0701 09/30/18 0716 09/30/18 1547 09/30/18 1944 10/01/18 0326  NA 142 142 142  --  138 139  K 4.8 4.8 5.5* 6.5* 4.9 5.4*  CL 112* 113* 112*  --  108 110  CO2 23 21* 25  --  25 23  GLUCOSE 94 83 86  --  125* 96  BUN 52* 45* 40*  --  41* 42*  CREATININE 2.93* 2.78* 2.58*  --  2.54* 2.45*  CALCIUM 9.0 8.7* 9.1  --  9.2 8.9   GFR: Estimated Creatinine  Clearance: 27.7 mL/min (A) (by C-G formula based on SCr of 2.45 mg/dL (H)). Liver Function Tests: Recent Labs  Lab 09/27/18 1545 09/28/18 0443  AST 27 25  ALT 15 11  ALKPHOS 72 65  BILITOT 0.6 0.5  PROT 6.8 6.3*  ALBUMIN 3.0* 2.7*   No results for input(s): LIPASE, AMYLASE in the last 168 hours. No results for input(s): AMMONIA in the last 168 hours. Coagulation Profile: No results for input(s): INR, PROTIME in the last 168 hours. Cardiac Enzymes: No results for input(s): CKTOTAL, CKMB, CKMBINDEX, TROPONINI in the last 168 hours. BNP (last 3 results) No results for input(s): PROBNP in the last 8760 hours. HbA1C: No results for input(s): HGBA1C in the last 72 hours. CBG: Recent Labs  Lab 09/30/18 0732  GLUCAP 75   Lipid Profile: No results for input(s): CHOL, HDL, LDLCALC, TRIG, CHOLHDL, LDLDIRECT in the last 72 hours. Thyroid Function Tests: No results for input(s): TSH, T4TOTAL, FREET4, T3FREE, THYROIDAB in the last 72 hours. Anemia Panel: No results for input(s): VITAMINB12, FOLATE, FERRITIN, TIBC, IRON, RETICCTPCT in the last 72 hours.    Radiology Studies: I have reviewed all of the imaging during this hospital visit personally     Scheduled Meds: . apixaban  2.5 mg Oral BID  . Iloperidone  9 mg Oral QHS  . sertraline  50 mg Oral Daily  . sodium zirconium cyclosilicate  10 g Oral TID   Continuous Infusions:   LOS: 4 days        Mauricio Gerome Apley, MD

## 2018-10-01 NOTE — Plan of Care (Signed)

## 2018-10-02 DIAGNOSIS — E875 Hyperkalemia: Secondary | ICD-10-CM

## 2018-10-02 HISTORY — DX: Hyperkalemia: E87.5

## 2018-10-02 LAB — CULTURE, BLOOD (ROUTINE X 2)
Culture: NO GROWTH
Culture: NO GROWTH

## 2018-10-02 LAB — BASIC METABOLIC PANEL
Anion gap: 7 (ref 5–15)
BUN: 46 mg/dL — ABNORMAL HIGH (ref 6–20)
CO2: 29 mmol/L (ref 22–32)
Calcium: 8.9 mg/dL (ref 8.9–10.3)
Chloride: 104 mmol/L (ref 98–111)
Creatinine, Ser: 2.48 mg/dL — ABNORMAL HIGH (ref 0.44–1.00)
GFR calc Af Amer: 24 mL/min — ABNORMAL LOW (ref >60)
GFR calc non Af Amer: 20 mL/min — ABNORMAL LOW (ref >60)
Glucose, Bld: 100 mg/dL — ABNORMAL HIGH (ref 70–99)
Potassium: 4.5 mmol/L (ref 3.5–5.1)
Sodium: 140 mmol/L (ref 135–145)

## 2018-10-02 MED ORDER — FUROSEMIDE 40 MG PO TABS: 40.0000 mg | ORAL_TABLET | Freq: Every day | ORAL | Status: DC

## 2018-10-02 MED ORDER — FUROSEMIDE 40 MG PO TABS: 40.0000 mg | ORAL_TABLET | Freq: Every day | ORAL | 0 refills | Status: DC

## 2018-10-02 NOTE — Progress Notes (Signed)
Patient educated on discharge instructions, taken by wheelchair to patient pick up.

## 2018-10-02 NOTE — Discharge Summary (Addendum)
Physician Discharge Summary  Kelsey Leonard [id number] DOB: 01-22-1955 DOA: 09/27/2018  PCP: Martinique, [name], MD  Admit date: 09/27/2018 Discharge date: 10/02/2018  Admitted From: Home  Disposition:  Home  Recommendations for Outpatient Follow-up and new medication changes:  1. Follow up with Dr. Martinique in 7 days.  2. Please obtain basic metabolic panel in 7 days.  3. Patient declined discharge to a skilled nursing facility.  Home health will be arranged.  Home Health: No   Equipment/Devices: no    Discharge Condition: stable  CODE STATUS: full  Diet recommendation: heart healthy   Brief/Interim Summary: 63 year old female who presented with generalized malaise. She does have significant past medical history for MRSA bacteremia, chronic kidney disease stage V, deep vein thrombosis, seizures, hypertension, asthma and schizophrenia. Patient had a prolonged hospital course in July -August2020 with acute on chronic hypoxic respiratory failure, COPD exacerbation required mechanical ventilation. She was found to have MRSA bacteremia with negative TEE. She was discharged with IV daptomycin to complete onSeptember 5. Since her discharge patient continued to have generalized weakness, decreased appetite, fatigue and lack of energy.Reports diarrhea at home and positive dyspnea for last 2 days, predominantly on exertion. She was seen at the outpatient infections disease clinic, her systolic blood pressure was 80, she was sent to the hospital for further evaluation. On her initial physical examination blood pressure was 134/105, heart rate 60, respiratoryrate27, oxygen saturation 100%.She had moist mucous membranes, her lungs had diffuse wheezing bilaterally, no rales, heart S1-S2 present and rhythmic,3/6systolic murmur, no gallops, herabdomen tender to palpation right upper quadrant, no lower extremity edema. Sodium 140, potassium 4.6, chloride 106, bicarb 25, glucose 107, BUN 58,  creatinine 3.4, troponin101, white count 5.5, hemoglobin 9.5 hematocrit 37.0, platelets 102.SARS COVID-19 was negative. Urinalysis negative for infection. Chest x-ray with hypoinflation, right base atelectasis. CT chest with bibasilar atelectasis, positive emphysema, CT of the abdomen negative for acute changes. EKG 65 bpm, normal axis, normal intervals, sinus rhythm, no ST segment or T wave changes.  Patient was admitted to the hospital working diagnosis of hypotension, rule out aspiration pneumonia complicated by sepsis  1.  Transient hypotension likely volume related.  Patient was admitted to the telemetry ward, she was initially placed on broad-spectrum artery therapy and received intravenous fluids.  She remained hemodynamically stable, no further diarrhea, infection was ruled out and antibiotics were discontinued.  Her troponin elevation likely due to hypotension in setting chronic kidney disease.  No acute coronary syndrome.  Her tunnel catheter was removed.  No further antibiotics prescribed.  2.  Acute kidney injury on chronic kidney disease stage IV with hyperkalemia.  Patient's creatinine improved down to 2.48 at discharge, her potassium peaked at 6.5, and she was placed in sodium zirconium along with Lasix with improvement of serum potassium.  At discharge her potassium is 4.5 and serum bicarbonate 29.  Patient will be discharged on 40 mg of furosemide in the setting of chronic kidney stage IV.  Further dose adjustment as an outpatient.  Follow-up renal panel in 7 days.  3.  COPD.  Remained stable with no signs of exacerbation.  4.  Depression/schizophrenia.  Continue sertraline.  5.  Obesity.  Calculated BMI is 42.  6.  History of DVT, left lower extremity deep vein thrombosis.  Patient continue current anticoagulation with apixaban.  Follow-up as an outpatient.  7.  History of MRSA bacteremia.  Patient completed antibiotic therapy as an outpatient, her times intravenous catheter  has been removed.  Patient  ruled out for sepsis/pneumonia during this hospitalization.  Discharge Diagnoses:  Principal Problem:   Hypotension Active Problems:   CKD (chronic kidney disease), stage IV (HCC)   Asthma   Bacteremia due to Staphylococcus aureus   Essential hypertension   Hyperkalemia    Discharge Instructions  Discharge Instructions    Diet - low sodium heart healthy   Complete by: As directed    Diet - low sodium heart healthy   Complete by: As directed    Discharge instructions   Complete by: As directed    Please follow with primary care in 7 days.   Discharge instructions   Complete by: As directed    Please follow with primary care in 7 days.   Increase activity slowly   Complete by: As directed    Increase activity slowly   Complete by: As directed      Allergies as of 10/02/2018      Reactions   Aspirin Nausea And Vomiting   Penicillins Hives   Has patient had a PCN reaction causing immediate rash, facial/tongue/throat swelling, SOB or lightheadedness with hypotension: Yes Has patient had a PCN reaction causing severe rash involving mucus membranes or skin necrosis: Yes Has patient had a PCN reaction that required hospitalization: Yes Has patient had a PCN reaction occurring within the last 10 years: No If all of the above answers are "NO", then may proceed with Cephalosporin use.   Shellfish Allergy Other (See Comments)   Sulfa Antibiotics Rash      Medication List    TAKE these medications   acetaminophen 500 MG tablet Commonly known as: TYLENOL Take 1,000 mg by mouth every 6 (six) hours as needed for mild pain or moderate pain.   benztropine 1 MG tablet Commonly known as: COGENTIN Take 1 mg by mouth 2 (two) times daily.   Centrum Silver Adult 50+ Tabs Take 1 tablet by mouth daily.   Eliquis 2.5 MG Tabs tablet Generic drug: apixaban Take 2.5 mg by mouth 2 (two) times daily.   Fanapt 6 MG Tabs Generic drug: Iloperidone Take 6 mg  by mouth daily.   furosemide 40 MG tablet Commonly known as: LASIX Take 1 tablet (40 mg total) by mouth daily.   sertraline 50 MG tablet Commonly known as: ZOLOFT Take 50 mg by mouth daily.   solifenacin 5 MG tablet Commonly known as: VESICARE Take 5 mg by mouth daily.      Follow-up Information    Martinique, [name], MD Follow up in 1 week(s).   Specialty: Family Medicine Contact information: [address]. [place] [address] [id number]          Allergies  Allergen Reactions  . Aspirin Nausea And Vomiting  . Penicillins Hives    Has patient had a PCN reaction causing immediate rash, facial/tongue/throat swelling, SOB or lightheadedness with hypotension: Yes Has patient had a PCN reaction causing severe rash involving mucus membranes or skin necrosis: Yes Has patient had a PCN reaction that required hospitalization: Yes Has patient had a PCN reaction occurring within the last 10 years: No If all of the above answers are "NO", then may proceed with Cephalosporin use.   . Shellfish Allergy Other (See Comments)  . Sulfa Antibiotics Rash    Consultations:     Procedures/Studies: Ct Abdomen Pelvis Wo Contrast  Result Date: 09/27/2018 CLINICAL DATA:  Acute respiratory illness, possible mass on radiograph EXAM: CT CHEST WITHOUT CONTRAST TECHNIQUE: Multidetector CT imaging of the chest was performed  following the standard protocol without IV contrast. COMPARISON:  Radiograph same day FINDINGS: Cardiovascular: There is mild prominence of the main pulmonary artery measuring roughly 3 cm at the level the bifurcation. Aortic atherosclerotic calcifications seen at the aortic arch and descending intrathoracic aorta. There is mild cardiomegaly. A right-sided MediPort catheter seen with the tip at the superior cavoatrial junction. No pericardial effusion. Mediastinum/Nodes: Somewhat limited due to lack of intravenous contrast, there appears to be a 2 cm left pretracheal  lymph node present. The trachea and esophagus are grossly unremarkable. Lungs/Pleura: Biapical scarring is seen with subpleural blebs. There is streaky/patchy airspace opacity within the left lower lung and right lung base. No large airspace consolidation or pleural effusion. Upper abdomen: The visualized portion of the upper abdomen is unremarkable. Musculoskeletal/Chest wall: There is no chest wall mass or suspicious osseous finding. No acute osseous abnormality Lower chest: No hiatal hernia. Hepatobiliary: Although limited due to the lack of intravenous contrast, normal in appearance without gross focal abnormality. The patient is status post cholecystectomy. No biliary ductal dilation. Pancreas:  Unremarkable.  No surrounding inflammatory changes. Spleen: Normal in size. Although limited due to the lack of intravenous contrast, normal in appearance. Adrenals/Urinary Tract: Both adrenal glands appear normal. The kidneys and collecting system appear normal without evidence of urinary tract calculus or hydronephrosis. The bladder is partially decompressed with apparent mild wall thickening. Stomach/Bowel: The stomach, small bowel, and colon are normal in appearance. No inflammatory changes or obstructive findings. Vascular/Lymphatic: There are no enlarged abdominal or pelvic lymph nodes. No significant gross vascular findings are present. Reproductive: Coarsely calcified exophytic uterine fibroids are present. Other: No evidence of abdominal wall mass or hernia. Musculoskeletal: No acute or significant osseous findings. Degenerative changes are seen in the thoracolumbar spine. IMPRESSION: 1. Mildly prominent main pulmonary arteries which can be suggestive of pulmonary artery hypertension. 2. Nonspecific mild patchy airspace opacity in the left lower lung and right base which could be atelectasis and/or atypical infectious etiology. 3. No acute intra-abdominal or pelvic pathology. Electronically Signed   By: Prudencio Pair M.D.   On: 09/27/2018 20:20   Dg Chest 2 View  Result Date: 09/27/2018 CLINICAL DATA:  Hypotension EXAM: CHEST - 2 VIEW COMPARISON:  August 19, 2018 chest radiograph and chest CT January 09, 2016 FINDINGS: Central catheter tip is in the superior vena cava. No pneumothorax. There is no edema or consolidation. Heart is upper normal in size. There is prominence in the right perihilar region. It is difficult by radiography to ascertain whether this prominence is due to vascular prominence versus potential adenopathy. Prior CT suggests that there may be a degree of pulmonary arterial hypertension. No bone lesions. IMPRESSION: No edema or consolidation. Prominence in the main pulmonary artery region, primarily on the right. While a degree of pulmonary arterial hypertension is suspected, there may be superimposed adenopathy. This radiographic appearance is felt to warrant correlation with chest CT, ideally with intravenous contrast, to assess for potential adenopathy. Heart size within normal limits. Electronically Signed   By: Lowella Grip III M.D.   On: 09/27/2018 16:11   Ct Chest Wo Contrast  Result Date: 09/27/2018 CLINICAL DATA:  Acute respiratory illness, possible mass on radiograph EXAM: CT CHEST WITHOUT CONTRAST TECHNIQUE: Multidetector CT imaging of the chest was performed following the standard protocol without IV contrast. COMPARISON:  Radiograph same day FINDINGS: Cardiovascular: There is mild prominence of the main pulmonary artery measuring roughly 3 cm at the level the bifurcation. Aortic atherosclerotic calcifications seen  at the aortic arch and descending intrathoracic aorta. There is mild cardiomegaly. A right-sided MediPort catheter seen with the tip at the superior cavoatrial junction. No pericardial effusion. Mediastinum/Nodes: Somewhat limited due to lack of intravenous contrast, there appears to be a 2 cm left pretracheal lymph node present. The trachea and esophagus are grossly  unremarkable. Lungs/Pleura: Biapical scarring is seen with subpleural blebs. There is streaky/patchy airspace opacity within the left lower lung and right lung base. No large airspace consolidation or pleural effusion. Upper abdomen: The visualized portion of the upper abdomen is unremarkable. Musculoskeletal/Chest wall: There is no chest wall mass or suspicious osseous finding. No acute osseous abnormality Lower chest: No hiatal hernia. Hepatobiliary: Although limited due to the lack of intravenous contrast, normal in appearance without gross focal abnormality. The patient is status post cholecystectomy. No biliary ductal dilation. Pancreas:  Unremarkable.  No surrounding inflammatory changes. Spleen: Normal in size. Although limited due to the lack of intravenous contrast, normal in appearance. Adrenals/Urinary Tract: Both adrenal glands appear normal. The kidneys and collecting system appear normal without evidence of urinary tract calculus or hydronephrosis. The bladder is partially decompressed with apparent mild wall thickening. Stomach/Bowel: The stomach, small bowel, and colon are normal in appearance. No inflammatory changes or obstructive findings. Vascular/Lymphatic: There are no enlarged abdominal or pelvic lymph nodes. No significant gross vascular findings are present. Reproductive: Coarsely calcified exophytic uterine fibroids are present. Other: No evidence of abdominal wall mass or hernia. Musculoskeletal: No acute or significant osseous findings. Degenerative changes are seen in the thoracolumbar spine. IMPRESSION: 1. Mildly prominent main pulmonary arteries which can be suggestive of pulmonary artery hypertension. 2. Nonspecific mild patchy airspace opacity in the left lower lung and right base which could be atelectasis and/or atypical infectious etiology. 3. No acute intra-abdominal or pelvic pathology. Electronically Signed   By: Prudencio Pair M.D.   On: 09/27/2018 20:20   Ir Removal Tun Cv  Cath W/o Fl  Result Date: 09/29/2018 INDICATION: Bacteremia. Request for removal of tunneled central venous catheter. EXAM: REMOVAL OF TUNNELED CENTRAL VENOUS CATHETER MEDICATIONS: 1% lidocaine 1 mL ANESTHESIA/SEDATION: No sedation medication needed. COMPLICATIONS: None immediate. PROCEDURE: Informed written consent was obtained from the patient following an explanation of the procedure, risks, benefits and alternatives to treatment. A time out was performed prior to the initiation of the procedure. Maximal barrier sterile technique was utilized including caps, mask, sterile gowns, sterile gloves, large sterile drape, hand hygiene, and ChloraPrep. 1% lidocaine with epinephrine was injected under sterile conditions along the subcutaneous tunnel. Utilizing a combination of blunt dissection and gentle traction, the catheter was removed intact. Hemostasis was obtained with manual compression. A dressing was placed. The patient tolerated the procedure well without immediate post procedural complication. IMPRESSION: Successful removal of tunneled central venous catheter. Read by: Gareth Eagle, PA-C Electronically Signed   By: Corrie Mckusick D.O.   On: 09/29/2018 14:49      Procedures: Removal of intravenous tunneled catheter right internal jugular vein  Subjective:  Patient is feeling better, no dyspnea, no diarrhea, no nausea or vomiting, no chest pain.   Discharge Exam: Vitals:   10/01/18 2129 10/02/18 0545  BP: (!) 120/54 126/70  Pulse:    Resp: 19 20  Temp: 98.5 F (36.9 C) 97.9 F (36.6 C)  SpO2: 100% 99%   Vitals:   10/01/18 0834 10/01/18 1617 10/01/18 2129 10/02/18 0545  BP: 125/80 104/65 (!) 120/54 126/70  Pulse: 72 72    Resp: 18 (!) 21 19  20  Temp: 98 F (36.7 C) 98.2 F (36.8 C) 98.5 F (36.9 C) 97.9 F (36.6 C)  TempSrc: Axillary Oral Oral   SpO2: 100% 96% 100% 99%  Weight:      Height:        General: Not in pain or dyspnea Neurology: Awake and alert, non focal  E ENT:  no pallor, no icterus, oral mucosa moist Cardiovascular: No JVD. S1-S2 present, rhythmic, no gallops, rubs, or murmurs. Non pitting lower extremity edema. Pulmonary: positive breath sounds bilaterally, adequate air movement, no wheezing, rhonchi or rales. Gastrointestinal. Abdomen protuberant with no organomegaly, non tender, no rebound or guarding Skin. No rashes Musculoskeletal: no joint deformities   The results of significant diagnostics from this hospitalization (including imaging, microbiology, ancillary and laboratory) are listed below for reference.     Microbiology: Recent Results (from the past 240 hour(s))  Culture, blood (Routine x 2)     Status: None   Collection Time: 09/27/18  3:35 PM   Specimen: BLOOD  Result Value Ref Range Status   Specimen Description BLOOD RIGHT ANTECUBITAL  Final   Special Requests   Final    BOTTLES DRAWN AEROBIC ONLY Blood Culture results may not be optimal due to an inadequate volume of blood received in culture bottles   Culture   Final    NO GROWTH 5 DAYS Performed at [facility], [place] [address] [place], [place] [address]    Report Status 10/02/2018 FINAL  Final  SARS Coronavirus 2 [facility] order, Performed in [facility] hospital lab) Nasopharyngeal Nasopharyngeal Swab     Status: None   Collection Time: 09/27/18  3:59 PM   Specimen: Nasopharyngeal Swab  Result Value Ref Range Status   SARS Coronavirus 2 NEGATIVE NEGATIVE Final    Comment: (NOTE) If result is NEGATIVE SARS-CoV-2 target nucleic acids are NOT DETECTED. The SARS-CoV-2 RNA is generally detectable in upper and lower  respiratory specimens during the acute phase of infection. The lowest  concentration of SARS-CoV-2 viral copies this assay can detect is 250  copies / mL. A negative result does not preclude SARS-CoV-2 infection  and should not be used as the sole basis for treatment or other  patient management decisions.  A negative result may occur with  improper  specimen collection / handling, submission of specimen other  than nasopharyngeal swab, presence of viral mutation(s) within the  areas targeted by this assay, and inadequate number of viral copies  (<250 copies / mL). A negative result must be combined with clinical  observations, patient history, and epidemiological information. If result is POSITIVE SARS-CoV-2 target nucleic acids are DETECTED. The SARS-CoV-2 RNA is generally detectable in upper and lower  respiratory specimens dur ing the acute phase of infection.  Positive  results are indicative of active infection with SARS-CoV-2.  Clinical  correlation with patient history and other diagnostic information is  necessary to determine patient infection status.  Positive results do  not rule out bacterial infection or co-infection with other viruses. If result is PRESUMPTIVE POSTIVE SARS-CoV-2 nucleic acids MAY BE PRESENT.   A presumptive positive result was obtained on the submitted specimen  and confirmed on repeat testing.  While 2019 novel coronavirus  (SARS-CoV-2) nucleic acids may be present in the submitted sample  additional confirmatory testing may be necessary for epidemiological  and / or clinical management purposes  to differentiate between  SARS-CoV-2 and other Sarbecovirus currently known to infect humans.  If clinically indicated additional testing with  an alternate test  methodology [phone]) is advised. The SARS-CoV-2 RNA is generally  detectable in upper and lower respiratory sp ecimens during the acute  phase of infection. The expected result is Negative. Fact Sheet for Patients:  StrictlyIdeas.no Fact Sheet for Healthcare Providers: BankingDealers.co.za This test is not yet approved or cleared by the [place] FDA and has been authorized for detection and/or diagnosis of SARS-CoV-2 by FDA under an Emergency Use Authorization (EUA).  This EUA will remain in  effect (meaning this test can be used) for the duration of the COVID-19 declaration under Section 564(b)(1) of the Act, 21 U.S.C. section 360bbb-3(b)(1), unless the authorization is terminated or revoked sooner. Performed at [facility], [place] [address], [place], [place] [address]   Urine culture     Status: None   Collection Time: 09/27/18  5:07 PM   Specimen: In/Out Cath Urine  Result Value Ref Range Status   Specimen Description IN/OUT CATH URINE  Final   Special Requests   Final    NONE Performed at [facility], [place] [address], [place], [place] [address]    Culture   Final    Multiple bacterial morphotypes present, none predominant. Suggest appropriate recollection if clinically indicated.   Report Status 09/28/2018 FINAL  Final  Culture, blood (Routine x 2)     Status: None   Collection Time: 09/27/18  5:36 PM   Specimen: BLOOD RIGHT HAND  Result Value Ref Range Status   Specimen Description BLOOD RIGHT HAND  Final   Special Requests   Final    BOTTLES DRAWN AEROBIC ONLY Blood Culture results may not be optimal due to an inadequate volume of blood received in culture bottles   Culture   Final    NO GROWTH 5 DAYS Performed at [facility], [place] [address], [place], [place] [address]    Report Status 10/02/2018 FINAL  Final     Labs: BNP (last 3 results) No results for input(s): BNP in the last 8760 hours. Basic Metabolic Panel: Recent Labs  Lab 09/30/18 0716  09/30/18 1944 10/01/18 0326 10/01/18 1455 10/01/18 1924 10/02/18 0541  NA 142  --  138 139  --  140 140  K 5.5*   < > 4.9 5.4* 5.8* 5.2* 4.5  CL 112*  --  108 110  --  104 104  CO2 25  --  25 23  --  26 29  GLUCOSE 86  --  125* 96  --  101* 100*  BUN 40*  --  41* 42*  --  44* 46*  CREATININE 2.58*  --  2.54* 2.45*  --  2.59* 2.48*  CALCIUM 9.1  --  9.2 8.9  --  9.1 8.9   < > = values in this interval not displayed.   Liver Function Tests: Recent Labs  Lab 09/27/18 1545  09/28/18 0443  AST 27 25  ALT 15 11  ALKPHOS 72 65  BILITOT 0.6 0.5  PROT 6.8 6.3*  ALBUMIN 3.0* 2.7*   No results for input(s): LIPASE, AMYLASE in the last 168 hours. No results for input(s): AMMONIA in the last 168 hours. CBC: Recent Labs  Lab 09/27/18 1557 09/28/18 0443 09/29/18 0701  WBC 5.5 4.8 5.3  NEUTROABS 2.8  --  2.9  HGB 11.5* 11.5* 10.3*  HCT 37.0 37.7 34.2*  MCV 92.5 94.5 94.5  PLT 102* 96* 91*   Cardiac Enzymes: No results for input(s): CKTOTAL, CKMB, CKMBINDEX, TROPONINI in  the last 168 hours. BNP: Invalid input(s): POCBNP CBG: Recent Labs  Lab 09/30/18 0732  GLUCAP 75   D-Dimer No results for input(s): DDIMER in the last 72 hours. Hgb A1c No results for input(s): HGBA1C in the last 72 hours. Lipid Profile No results for input(s): CHOL, HDL, LDLCALC, TRIG, CHOLHDL, LDLDIRECT in the last 72 hours. Thyroid function studies No results for input(s): TSH, T4TOTAL, T3FREE, THYROIDAB in the last 72 hours.  Invalid input(s): FREET3 Anemia work up No results for input(s): VITAMINB12, FOLATE, FERRITIN, TIBC, IRON, RETICCTPCT in the last 72 hours. Urinalysis    Component Value Date/Time   COLORURINE YELLOW 09/27/2018 1700   APPEARANCEUR HAZY (A) 09/27/2018 1700   LABSPEC 1.012 09/27/2018 1700   PHURINE 5.0 09/27/2018 1700   GLUCOSEU NEGATIVE 09/27/2018 1700   HGBUR LARGE (A) 09/27/2018 1700   BILIRUBINUR NEGATIVE 09/27/2018 1700   KETONESUR NEGATIVE 09/27/2018 1700   PROTEINUR 100 (A) 09/27/2018 1700   NITRITE NEGATIVE 09/27/2018 1700   LEUKOCYTESUR NEGATIVE 09/27/2018 1700   Sepsis Labs Invalid input(s): PROCALCITONIN,  WBC,  LACTICIDVEN Microbiology Recent Results (from the past 240 hour(s))  Culture, blood (Routine x 2)     Status: None   Collection Time: 09/27/18  3:35 PM   Specimen: BLOOD  Result Value Ref Range Status   Specimen Description BLOOD RIGHT ANTECUBITAL  Final   Special Requests   Final    BOTTLES DRAWN AEROBIC ONLY Blood  Culture results may not be optimal due to an inadequate volume of blood received in culture bottles   Culture   Final    NO GROWTH 5 DAYS Performed at [facility], [place] [address], [place],  [address]    Report Status 10/02/2018 FINAL  Final  SARS Coronavirus 2 [facility] order, Performed in [facility] hospital lab) Nasopharyngeal Nasopharyngeal Swab     Status: None   Collection Time: 09/27/18  3:59 PM   Specimen: Nasopharyngeal Swab  Result Value Ref Range Status   SARS Coronavirus 2 NEGATIVE NEGATIVE Final    Comment: (NOTE) If result is NEGATIVE SARS-CoV-2 target nucleic acids are NOT DETECTED. The SARS-CoV-2 RNA is generally detectable in upper and lower  respiratory specimens during the acute phase of infection. The lowest  concentration of SARS-CoV-2 viral copies this assay can detect is 250  copies / mL. A negative result does not preclude SARS-CoV-2 infection  and should not be used as the sole basis for treatment or other  patient management decisions.  A negative result may occur with  improper specimen collection / handling, submission of specimen other  than nasopharyngeal swab, presence of viral mutation(s) within the  areas targeted by this assay, and inadequate number of viral copies  (<250 copies / mL). A negative result must be combined with clinical  observations, patient history, and epidemiological information. If result is POSITIVE SARS-CoV-2 target nucleic acids are DETECTED. The SARS-CoV-2 RNA is generally detectable in upper and lower  respiratory specimens dur ing the acute phase of infection.  Positive  results are indicative of active infection with SARS-CoV-2.  Clinical  correlation with patient history and other diagnostic information is  necessary to determine patient infection status.  Positive results do  not rule out bacterial infection or co-infection with other viruses. If result is PRESUMPTIVE POSTIVE SARS-CoV-2 nucleic acids MAY BE  PRESENT.   A presumptive positive result was obtained on the submitted specimen  and confirmed on repeat testing.  While 2019 novel coronavirus  (SARS-CoV-2) nucleic acids  may be present in the submitted sample  additional confirmatory testing may be necessary for epidemiological  and / or clinical management purposes  to differentiate between  SARS-CoV-2 and other Sarbecovirus currently known to infect humans.  If clinically indicated additional testing with an alternate test  methodology [phone]) is advised. The SARS-CoV-2 RNA is generally  detectable in upper and lower respiratory sp ecimens during the acute  phase of infection. The expected result is Negative. Fact Sheet for Patients:  StrictlyIdeas.no Fact Sheet for Healthcare Providers: BankingDealers.co.za This test is not yet approved or cleared by the [place] FDA and has been authorized for detection and/or diagnosis of SARS-CoV-2 by FDA under an Emergency Use Authorization (EUA).  This EUA will remain in effect (meaning this test can be used) for the duration of the COVID-19 declaration under Section 564(b)(1) of the Act, 21 U.S.C. section 360bbb-3(b)(1), unless the authorization is terminated or revoked sooner. Performed at [facility], [place] [address] [place], [place] [address]   Urine culture     Status: None   Collection Time: 09/27/18  5:07 PM   Specimen: In/Out Cath Urine  Result Value Ref Range Status   Specimen Description IN/OUT CATH URINE  Final   Special Requests   Final    NONE Performed at [facility], [place] [address] [place], [place] [address]    Culture   Final    Multiple bacterial morphotypes present, none predominant. Suggest appropriate recollection if clinically indicated.   Report Status 09/28/2018 FINAL  Final  Culture, blood (Routine x 2)     Status: None   Collection Time: 09/27/18  5:36 PM   Specimen: BLOOD RIGHT HAND  Result  Value Ref Range Status   Specimen Description BLOOD RIGHT HAND  Final   Special Requests   Final    BOTTLES DRAWN AEROBIC ONLY Blood Culture results may not be optimal due to an inadequate volume of blood received in culture bottles   Culture   Final    NO GROWTH 5 DAYS Performed at [facility], [place] [address] [place], [place] [address]    Report Status 10/02/2018 FINAL  Final     Time coordinating discharge: 45 minutes  SIGNED:   Tawni Millers, MD  [facility] 10/02/2018, 2:11 PM

## 2018-10-16 DIAGNOSIS — R1310 Dysphagia, unspecified: Secondary | ICD-10-CM | POA: Insufficient documentation

## 2018-10-16 DIAGNOSIS — R1084 Generalized abdominal pain: Secondary | ICD-10-CM | POA: Insufficient documentation

## 2018-12-08 ENCOUNTER — Encounter: Payer: Self-pay | Admitting: Cardiology

## 2018-12-08 ENCOUNTER — Ambulatory Visit (INDEPENDENT_AMBULATORY_CARE_PROVIDER_SITE_OTHER): Payer: Medicaid Other | Admitting: Cardiology

## 2018-12-08 ENCOUNTER — Ambulatory Visit: Payer: Medicaid Other | Admitting: Cardiology

## 2018-12-08 ENCOUNTER — Ambulatory Visit (INDEPENDENT_AMBULATORY_CARE_PROVIDER_SITE_OTHER): Payer: Medicaid Other

## 2018-12-08 ENCOUNTER — Other Ambulatory Visit: Payer: Self-pay

## 2018-12-08 VITALS — BP 140/76 | HR 60 | Ht 62.0 in | Wt 230.0 lb

## 2018-12-08 DIAGNOSIS — E669 Obesity, unspecified: Secondary | ICD-10-CM | POA: Diagnosis not present

## 2018-12-08 DIAGNOSIS — R002 Palpitations: Secondary | ICD-10-CM

## 2018-12-08 DIAGNOSIS — I10 Essential (primary) hypertension: Secondary | ICD-10-CM | POA: Diagnosis not present

## 2018-12-08 DIAGNOSIS — I5032 Chronic diastolic (congestive) heart failure: Secondary | ICD-10-CM

## 2018-12-08 DIAGNOSIS — E782 Mixed hyperlipidemia: Secondary | ICD-10-CM | POA: Diagnosis not present

## 2018-12-08 NOTE — Progress Notes (Signed)
Cardiology Office Note:    Date:  12/08/2018   ID:  Kelsey Leonard, DOB July 17, 1955, MRN [id number]  PCP:  Martinique, [name], MD  Cardiologist:  Berniece Salines, DO  Electrophysiologist:  None   Referring MD: Martinique, [name], MD   Chief Complaint  Patient presents with  . Congestive Heart Failure    History of Present Illness:    Kelsey Leonard is a 63 y.o. female with a hx of diastolic heart failure, chronic kidney disease, DVT (on Eliquis ), hypertension and hypothyroidism.  The patient is a poor historian.  She is also here with her sister [name].  The patient denies any history of atrial fibrillation or any cardiac arrhythmia.  She had a sister pretty sure that she was only started on the Eliquis due to DVTs.  She denies any chest pain, palpitations.  However admits that shortness of breath but tells me that this is chronic due to her uncontrolled asthma.  Of note the patient was recently hospitalized at the [facility] where she was treated for MRSA bacteremia and respiratory failure quiring intubation.  During her hospitalization she had a TEE which revealed no vegetation and was treated with daptomycin.  She does follows with infectious disease.   Past Medical History:  Diagnosis Date  . Asthma   . CKD (chronic kidney disease)   . Depression   . DVT (deep venous thrombosis) ([place])   . 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 GERD (gastroesophageal reflux disease)   . Hypertension   . Hypothyroidism   . Low blood pressure 09/27/2018  . Osteoarthritis   . Schizophrenia ([place])   . Seizures ([place])   . Sepsis [facility])     Past Surgical History:  Procedure Laterality Date  . ABDOMINAL HYSTERECTOMY    . IR FLUORO GUIDE CV LINE RIGHT  08/24/2018  . IR REMOVAL TUN CV CATH W/O FL  09/29/2018  . IR US GUIDE VASC ACCESS RIGHT  08/24/2018  . TEE WITHOUT CARDIOVERSION N/A 08/22/2018   Procedure: TRANSESOPHAGEAL ECHOCARDIOGRAM (TEE);  Surgeon:  Lelon Perla, MD;  Location: [facility] ENDOSCOPY;  Service: Cardiovascular;  Laterality: N/A;  . THYROIDECTOMY, PARTIAL      Current Medications: Current Meds  Medication Sig  . acetaminophen (TYLENOL) 500 MG tablet Take 1,000 mg by mouth every 6 (six) hours as needed for mild pain or moderate pain.  Marland Kitchen albuterol (VENTOLIN HFA) 108 (90 Base) MCG/ACT inhaler Inhale 2 puffs into the lungs every 6 (six) hours as needed for wheezing or shortness of breath.  Marland Kitchen apixaban (ELIQUIS) 2.5 MG TABS tablet Take 2.5 mg by mouth 2 (two) times daily.  Marland Kitchen aspirin EC 81 MG tablet Take 81 mg by mouth daily.  . clonazePAM (KLONOPIN) 0.5 MG tablet Take 0.5 mg by mouth daily.  . furosemide (LASIX) 20 MG tablet Take 20 mg by mouth daily.  . hydrochlorothiazide (HYDRODIURIL) 25 MG tablet Take 25 mg by mouth daily.  . Iloperidone (FANAPT) 6 MG TABS Take 6 mg by mouth daily.  Marland Kitchen losartan-hydrochlorothiazide (HYZAAR) 50-12.5 MG tablet Take 1 tablet by mouth daily.  . Multiple Vitamins-Minerals (CENTRUM SILVER ADULT 50+) TABS Take 1 tablet by mouth daily.  . nortriptyline (PAMELOR) 25 MG capsule Take 25 mg by mouth 2 (two) times daily.  . pravastatin (PRAVACHOL) 40 MG tablet Take 40 mg by mouth daily.  . sertraline (ZOLOFT) 100 MG tablet Take 100 mg by mouth daily.  . solifenacin (  VESICARE) 5 MG tablet Take 5 mg by mouth daily.  . traMADol (ULTRAM) 50 MG tablet Take 50 mg by mouth every 6 (six) hours as needed.  . [DISCONTINUED] benztropine (COGENTIN) 1 MG tablet Take 1 mg by mouth 2 (two) times daily.     Allergies:   Aspirin, Penicillins, Shellfish allergy, and Sulfa antibiotics   Social History   Socioeconomic History  . Marital status: Single    Spouse name: Not on file  . Number of children: Not on file  . Years of education: Not on file  . Highest education level: Not on file  Occupational History  . Not on file  Social Needs  . Financial resource strain: Not on file  . Food insecurity    Worry: Not on  file    Inability: Not on file  . Transportation needs    Medical: Not on file    Non-medical: Not on file  Tobacco Use  . Smoking status: Never Smoker  . Smokeless tobacco: Never Used  Substance and Sexual Activity  . Alcohol use: Never    Frequency: Never  . Drug use: Never  . Sexual activity: Not Currently  Lifestyle  . Physical activity    Days per week: Not on file    Minutes per session: Not on file  . Stress: Not on file  Relationships  . Social Herbalist on phone: Not on file    Gets together: Not on file    Attends religious service: Not on file    Active member of club or organization: Not on file    Attends meetings of clubs or organizations: Not on file    Relationship status: Not on file  Other Topics Concern  . Not on file  Social History Narrative  . Not on file     Family History: The patient's family history includes Diabetes in her brother and sister; Hypertension in her brother, mother, and sister.  ROS:   Review of Systems  Constitution: Negative for decreased appetite, fever and weight gain.  HENT: Negative for congestion, ear discharge, hoarse voice and sore throat.   Eyes: Negative for discharge, redness, vision loss in right eye and visual halos.  Cardiovascular: Negative for chest pain, dyspnea on exertion, leg swelling, orthopnea and palpitations.  Respiratory: Negative for cough, hemoptysis, shortness of breath and snoring.   Endocrine: Negative for heat intolerance and polyphagia.  Hematologic/Lymphatic: Negative for bleeding problem. Does not bruise/bleed easily.  Skin: Negative for flushing, nail changes, rash and suspicious lesions.  Musculoskeletal: Negative for arthritis, joint pain, muscle cramps, myalgias, neck pain and stiffness.  Gastrointestinal: Negative for abdominal pain, bowel incontinence, diarrhea and excessive appetite.  Genitourinary: Negative for decreased libido, genital sores and incomplete emptying.   Neurological: Negative for brief paralysis, focal weakness, headaches and loss of balance.  Psychiatric/Behavioral: Negative for altered mental status, depression and suicidal ideas.  Allergic/Immunologic: Negative for HIV exposure and persistent infections.    EKGs/Labs/Other Studies Reviewed:    The following studies were reviewed today:   EKG:  The ekg ordered today demonstrates sinus rhythm, heart rate 60/min compared to prior EKG no significant change.  Recent Labs: 08/12/2018: TSH 1.773 08/14/2018: Magnesium 2.2 09/28/2018: ALT 11 09/29/2018: Hemoglobin 10.3; Platelets 91 10/02/2018: BUN 46; Creatinine, Ser 2.48; Potassium 4.5; Sodium 140  Recent Lipid Panel    Component Value Date/Time   TRIG 49 08/13/2018 0347    Physical Exam:    VS:  BP 140/76 (BP Location:  Left Arm, Patient Position: Sitting, Cuff Size: Large)   Pulse 60   Ht 5\' 2"  (1.575 m)   Wt 230 lb (104.3 kg)   SpO2 91%   BMI 42.07 kg/m     Wt Readings from Last 3 Encounters:  12/08/18 230 lb (104.3 kg)  09/28/18 230 lb (104.3 kg)  08/24/18 222 lb 0.1 oz (100.7 kg)     GEN: Well nourished, well developed in no acute distress HEENT: Normal NECK: No JVD; No carotid bruits LYMPHATICS: No lymphadenopathy CARDIAC: S1S2 noted,RRR, no murmurs, rubs, gallops RESPIRATORY:  Clear to auscultation without rales, wheezing or rhonchi  ABDOMEN: Soft, non-tender, non-distended, +bowel sounds, no guarding. EXTREMITIES: No edema, No cyanosis, no clubbing MUSCULOSKELETAL:  No edema; No deformity  SKIN: Warm and dry NEUROLOGIC:  Alert and oriented x 3, non-focal PSYCHIATRIC:  Normal affect, good insight  ASSESSMENT:    1. Essential hypertension   2. Palpitations   3. Mixed hyperlipidemia   4. Obesity (BMI 30-39.9)   5. Chronic heart failure with preserved ejection fraction (HCC)    PLAN:    1.  The patient is here for GI clearance for possible endoscopy due to being on Eliquis.  Unfortunately she is not on  Eliquis for any cardiac reason.  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  He has not followed with a hematologist since this was started.  I would prefer that the patient see hematology to be able to understand when she needs to be off of this medication for her DVT/PE treatment.  Given that she is a poor historian and she is not completely sure if she ever was diagnosed with atrial fibrillation and notes that she sometimes have palpitations I will place a Zio patch on the patient for 7 days to understand there is any rhythm changes or heart rate excursions.  I discussed this with the patient and her sister who was present for her visit.  2.  Hypertension-her blood pressure is acceptable in the office today on her current antihypertensive regimen.  3.  She does not appear to be decompensated at this time.  Continue patient on her Lasix.  4.  Hyperlipidemia-continue patient on pravastatin.  The patient is in agreement with the above plan. The patient left the office in stable condition.  The patient will follow up in 4 to 6 weeks or sooner if needed.   Medication Adjustments/Labs and Tests Ordered: Current medicines are reviewed at length with the patient today.  Concerns regarding medicines are outlined above.  Orders Placed This Encounter  Procedures  . LONG TERM MONITOR (3-14 DAYS)  . EKG 12-Lead   No orders of the defined types were placed in this encounter.   Patient Instructions  Medication Instructions:  Your physician recommends that you continue on your current medications as directed. Please refer to the Current Medication list given to you today.  *If you need a refill on your cardiac medications before your next appointment, please call your pharmacy*  Lab Work: None  If you have labs (blood work) drawn today and your tests are completely normal, you will receive your results only by: Marland Kitchen MyChart Message (if you have MyChart) OR . A paper copy in the mail If you have any  lab test that is abnormal or we need to change your treatment, we will call you to review the results.  Testing/Procedures: A zio monitor was placed today. It will remain on for 7 days. You will then return monitor and  event diary in provided box. It takes 1-2 weeks for report to be downloaded and returned to [place]. We will call you with the results. If monitor falls off or has orange flashing light, please call Zio for further instructions.     Follow-Up: At [facility], you and your health needs are our priority.  As part of our continuing mission to provide you with exceptional heart care, we have created designated Provider Care Teams.  These Care Teams include your primary Cardiologist (physician) and Advanced Practice Providers (APPs -  Physician Assistants and Nurse Practitioners) who all work together to provide you with the care you need, when you need it.  Your next appointment:   4 week(s)- 6 weeks   The format for your next appointment:   In Person  Provider:   Berniece Salines, DO  Other Instructions      Adopting a Healthy Lifestyle.  Know what a healthy weight is for you (roughly BMI <25) and aim to maintain this   Aim for 7+ servings of fruits and vegetables daily   65-80+ fluid ounces of water or unsweet tea for healthy kidneys   Limit to max 1 drink of alcohol per day; avoid smoking/tobacco   Limit animal fats in diet for cholesterol and heart health - choose grass fed whenever available   Avoid highly processed foods, and foods high in saturated/trans fats   Aim for low stress - take time to unwind and care for your mental health   Aim for 150 min of moderate intensity exercise weekly for heart health, and weights twice weekly for bone health   Aim for 7-9 hours of sleep daily   When it comes to diets, agreement about the perfect plan isnt easy to find, even among the experts. Experts at the [employer] developed an idea known as the  Healthy Eating Plate. Just imagine a plate divided into logical, healthy portions.   The emphasis is on diet quality:   Load up on vegetables and fruits - one-half of your plate: Aim for color and variety, and remember that potatoes dont count.   Go for whole grains - one-quarter of your plate: Whole wheat, barley, wheat berries, quinoa, oats, brown rice, and foods made with them. If you want pasta, go with whole wheat pasta.   Protein power - one-quarter of your plate: Fish, chicken, beans, and nuts are all healthy, versatile protein sources. Limit red meat.   The diet, however, does go beyond the plate, offering a few other suggestions.   Use healthy plant oils, such as olive, canola, soy, corn, sunflower and peanut. Check the labels, and avoid partially hydrogenated oil, which have unhealthy trans fats.   If youre thirsty, drink water. Coffee and tea are good in moderation, but skip sugary drinks and limit milk and dairy products to one or two daily servings.   The type of carbohydrate in the diet is more important than the amount. Some sources of carbohydrates, such as vegetables, fruits, whole grains, and beans-are healthier than others.   Finally, stay active  Signed, Berniece Salines, DO  12/08/2018 1:16 PM    [facility]

## 2018-12-08 NOTE — Patient Instructions (Signed)
Medication Instructions:  Your physician recommends that you continue on your current medications as directed. Please refer to the Current Medication list given to you today.  *If you need a refill on your cardiac medications before your next appointment, please call your pharmacy*  Lab Work: None  If you have labs (blood work) drawn today and your tests are completely normal, you will receive your results only by: Marland Kitchen MyChart Message (if you have MyChart) OR . A paper copy in the mail If you have any lab test that is abnormal or we need to change your treatment, we will call you to review the results.  Testing/Procedures: A zio monitor was placed today. It will remain on for 7 days. You will then return monitor and event diary in provided box. It takes 1-2 weeks for report to be downloaded and returned to [place]. We will call you with the results. If monitor falls off or has orange flashing light, please call Zio for further instructions.     Follow-Up: At [facility], you and your health needs are our priority.  As part of our continuing mission to provide you with exceptional heart care, we have created designated Provider Care Teams.  These Care Teams include your primary Cardiologist (physician) and Advanced Practice Providers (APPs -  Physician Assistants and Nurse Practitioners) who all work together to provide you with the care you need, when you need it.  Your next appointment:   4 week(s)- 6 weeks   The format for your next appointment:   In Person  Provider:   Berniece Salines, DO  Other Instructions

## 2018-12-08 NOTE — Addendum Note (Signed)
Addended by: Jerl Santos R on: 12/08/2018 04:24 PM   Modules accepted: Orders

## 2019-01-03 ENCOUNTER — Telehealth: Payer: Self-pay | Admitting: *Deleted

## 2019-01-03 NOTE — Telephone Encounter (Signed)
Left message to return call regarding monitor

## 2019-01-05 ENCOUNTER — Other Ambulatory Visit: Payer: Self-pay

## 2019-01-05 ENCOUNTER — Ambulatory Visit (INDEPENDENT_AMBULATORY_CARE_PROVIDER_SITE_OTHER): Payer: Medicaid Other | Admitting: Cardiology

## 2019-01-05 ENCOUNTER — Encounter: Payer: Self-pay | Admitting: Cardiology

## 2019-01-05 VITALS — BP 144/80 | HR 73 | Ht 62.0 in | Wt 232.0 lb

## 2019-01-05 DIAGNOSIS — I10 Essential (primary) hypertension: Secondary | ICD-10-CM | POA: Diagnosis not present

## 2019-01-05 MED ORDER — FUROSEMIDE 40 MG PO TABS: 40.0000 mg | ORAL_TABLET | Freq: Every day | ORAL | 1 refills | Status: DC

## 2019-01-05 MED ORDER — POTASSIUM CHLORIDE CRYS ER 20 MEQ PO TBCR: 20.0000 meq | EXTENDED_RELEASE_TABLET | Freq: Every day | ORAL | 1 refills | Status: DC

## 2019-01-05 NOTE — Patient Instructions (Signed)
Medication Instructions:  Your physician has recommended you make the following change in your medication:   INCREASE: Furosemide to 40 mg Take 1 tab daily ( take 2 tabs of 20 mg daily until you run out) START: Potassium 20 meq Take 1 tab daily  PLEASE BRING MEDICATION BOTTLES AT NEXT VISIT  *If you need a refill on your cardiac medications before your next appointment, please call your pharmacy*  Lab Work: Your physician recommends that you return for lab work [id number],MGNESIUM  If you have labs (blood work) drawn today and your tests are completely normal, you will receive your results only by: Marland Kitchen MyChart Message (if you have MyChart) OR . A paper copy in the mail If you have any lab test that is abnormal or we need to change your treatment, we will call you to review the results.  Testing/Procedures: nONE  Follow-Up: At [facility], you and your health needs are our priority.  As part of our continuing mission to provide you with exceptional heart care, we have created designated Provider Care Teams.  These Care Teams include your primary Cardiologist (physician) and Advanced Practice Providers (APPs -  Physician Assistants and Nurse Practitioners) who all work together to provide you with the care you need, when you need it.  Your next appointment:   1 month(s)  The format for your next appointment:   In Person  Provider:   Berniece Salines, DO  Other Instructions

## 2019-01-05 NOTE — Progress Notes (Signed)
Cardiology Office Note:    Date:  01/05/2019   ID:  Kelsey Leonard, DOB 01/18/1956, MRN [id number]  PCP:  Martinique, [name], MD  Cardiologist:  Berniece Salines, DO  Electrophysiologist:  None   Referring MD: Martinique, [name], MD   Chief Complaint  Patient presents with  . 1 month Follow-up    History of Present Illness:    Kelsey Leonard is a 63 y.o. female with a hx of diastolic heart failure, chronic kidney disease, DVT (on Eliquis ), hypertension and hypothyroidism.  The patient is a poor historian. She is here with her sister [name]. She was last seen on 12/08/2018 at that time she presented as her GI clearance for cardiology to make comment on her Eliquis. At that visit, the patient denied history of atrial fibrillation.   The patient was continued on her medications with no changes. Due to being a poor historian and her complained of palpitations a zio monitor was placed on the patient to assess for atrial fibrillaiton.   She is here for a follow up visit. She tells me that she mailed her monitor but unfortunately we have not received that information yet.  She offers no complaints at this time.    Past Medical History:  Diagnosis Date  . Asthma   . CKD (chronic kidney disease)   . Depression   . DVT (deep venous thrombosis) ([place])   . 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 GERD (gastroesophageal reflux disease)   . Hypertension   . Hypothyroidism   . Low blood pressure 09/27/2018  . Osteoarthritis   . Schizophrenia ([place])   . Seizures ([place])   . Sepsis [facility])     Past Surgical History:  Procedure Laterality Date  . ABDOMINAL HYSTERECTOMY    . IR FLUORO GUIDE CV LINE RIGHT  08/24/2018  . IR REMOVAL TUN CV CATH W/O FL  09/29/2018  . IR US GUIDE VASC ACCESS RIGHT  08/24/2018  . TEE WITHOUT CARDIOVERSION N/A 08/22/2018   Procedure: TRANSESOPHAGEAL ECHOCARDIOGRAM (TEE);  Surgeon: Lelon Perla, MD;  Location: [facility] ENDOSCOPY;  Service:  Cardiovascular;  Laterality: N/A;  . THYROIDECTOMY, PARTIAL      Current Medications: Current Meds  Medication Sig  . acetaminophen (TYLENOL) 500 MG tablet Take 1,000 mg by mouth every 6 (six) hours as needed for mild pain or moderate pain.  Marland Kitchen albuterol (VENTOLIN HFA) 108 (90 Base) MCG/ACT inhaler Inhale 2 puffs into the lungs every 6 (six) hours as needed for wheezing or shortness of breath.  Marland Kitchen apixaban (ELIQUIS) 2.5 MG TABS tablet Take 2.5 mg by mouth 2 (two) times daily.  Marland Kitchen aspirin EC 81 MG tablet Take 81 mg by mouth daily.  . clonazePAM (KLONOPIN) 0.5 MG tablet Take 0.5 mg by mouth daily.  . nortriptyline (PAMELOR) 25 MG capsule Take 25 mg by mouth 2 (two) times daily.  . pravastatin (PRAVACHOL) 40 MG tablet Take 40 mg by mouth daily.  . sertraline (ZOLOFT) 100 MG tablet Take 100 mg by mouth daily.  . solifenacin (VESICARE) 5 MG tablet Take 5 mg by mouth daily.  . traMADol (ULTRAM) 50 MG tablet Take 50 mg by mouth every 6 (six) hours as needed.  . [DISCONTINUED] furosemide (LASIX) 20 MG tablet Take 20 mg by mouth daily.     Allergies:   Aspirin, Penicillins, Shellfish allergy, and Sulfa antibiotics   Social History   Socioeconomic History  . Marital status: Single  Spouse name: Not on file  . Number of children: Not on file  . Years of education: Not on file  . Highest education level: Not on file  Occupational History  . Not on file  Tobacco Use  . Smoking status: Never Smoker  . Smokeless tobacco: Never Used  Substance and Sexual Activity  . Alcohol use: Never  . Drug use: Never  . Sexual activity: Not Currently  Other Topics Concern  . Not on file  Social History Narrative  . Not on file   Social Determinants of Health   Financial Resource Strain:   . Difficulty of Paying Living Expenses: Not on file  Food Insecurity:   . Worried About Charity fundraiser in the Last Year: Not on file  . Ran Out of Food in the Last Year: Not on file  Transportation Needs:     . Lack of Transportation (Medical): Not on file  . Lack of Transportation (Non-Medical): Not on file  Physical Activity:   . Days of Exercise per Week: Not on file  . Minutes of Exercise per Session: Not on file  Stress:   . Feeling of Stress : Not on file  Social Connections:   . Frequency of Communication with Friends and Family: Not on file  . Frequency of Social Gatherings with Friends and Family: Not on file  . Attends Religious Services: Not on file  . Active Member of Clubs or Organizations: Not on file  . Attends Archivist Meetings: Not on file  . Marital Status: Not on file     Family History: The patient's family history includes Diabetes in her brother and sister; Hypertension in her brother, mother, and sister.  ROS:   Review of Systems  Constitution: Negative for decreased appetite, fever and weight gain.  HENT: Negative for congestion, ear discharge, hoarse voice and sore throat.   Eyes: Negative for discharge, redness, vision loss in right eye and visual halos.  Cardiovascular: Negative for chest pain, dyspnea on exertion, leg swelling, orthopnea and palpitations.  Respiratory: Negative for cough, hemoptysis, shortness of breath and snoring.   Endocrine: Negative for heat intolerance and polyphagia.  Hematologic/Lymphatic: Negative for bleeding problem. Does not bruise/bleed easily.  Skin: Negative for flushing, nail changes, rash and suspicious lesions.  Musculoskeletal: Negative for arthritis, joint pain, muscle cramps, myalgias, neck pain and stiffness.  Gastrointestinal: Negative for abdominal pain, bowel incontinence, diarrhea and excessive appetite.  Genitourinary: Negative for decreased libido, genital sores and incomplete emptying.  Neurological: Negative for brief paralysis, focal weakness, headaches and loss of balance.  Psychiatric/Behavioral: Negative for altered mental status, depression and suicidal ideas.  Allergic/Immunologic: Negative for  HIV exposure and persistent infections.    EKGs/Labs/Other Studies Reviewed:    The following studies were reviewed today:   EKG: None today  Thoracic echocardiogram IMPRESSIONS 08/13/2018  1. The left ventricle has low normal systolic function, with an ejection fraction of 50-55%. The cavity size was normal. There is moderately increased left ventricular wall thickness. Left ventricular diastolic Doppler parameters are consistent with  pseudonormalization. No evidence of left ventricular regional wall motion abnormalities.  2. The right ventricle has normal systolic function. The cavity was normal. There is no increase in right ventricular wall thickness. Right ventricular systolic pressure could not be assessed.  3. The aortic valve is tricuspid. Mild aortic annular calcification noted.  4. The mitral valve is grossly normal.  5. The tricuspid valve is grossly normal. Apparent loose or redundant portion  of chordal apparatus noted on ventricular side of valve.  6. The aorta is normal in size and structure  7. No definitive valvular vegetations.  Recent Labs: 08/12/2018: TSH 1.773 08/14/2018: Magnesium 2.2 09/28/2018: ALT 11 09/29/2018: Hemoglobin 10.3; Platelets 91 10/02/2018: BUN 46; Creatinine, Ser 2.48; Potassium 4.5; Sodium 140  Recent Lipid Panel    Component Value Date/Time   TRIG 49 08/13/2018 0347    Physical Exam:    VS:  BP (!) 144/80   Pulse 73   Ht 5\' 2"  (1.575 m)   Wt 232 lb (105.2 kg)   SpO2 (!) 85%   BMI 42.43 kg/m     Wt Readings from Last 3 Encounters:  01/05/19 232 lb (105.2 kg)  12/08/18 230 lb (104.3 kg)  09/28/18 230 lb (104.3 kg)     GEN: Well nourished, well developed in no acute distress HEENT: Normal NECK: No JVD; No carotid bruits LYMPHATICS: No lymphadenopathy CARDIAC: S1S2 noted,RRR, no murmurs, rubs, gallops RESPIRATORY:  Clear to auscultation without rales, wheezing or rhonchi  ABDOMEN: Soft, non-tender, non-distended, +bowel sounds, no  guarding. EXTREMITIES: bilateral leg edema, No cyanosis, no clubbing MUSCULOSKELETAL:  No deformity  SKIN: Warm and dry NEUROLOGIC:  Alert and oriented x 3, non-focal PSYCHIATRIC:  Normal affect, good insight  ASSESSMENT:    1. Essential hypertension    PLAN:    Blood pressure slightly elevated today patient tells me that she has been taking her medications.  Fortunately she does not know the dosage of her medication have asked her to please bring her medicine with her the next appointment.  Meanwhile she does have bilateral leg edema noted to increase her Lasix from 20 mg daily to 40 mg daily.  She will take her Lasix along with potassium chloride.  She still is on Eliquis for her DVT. She is being managed by her primary care doctor she has not seen hematology oncology to understand duration.  The patient is in agreement with the above plan. The patient left the office in stable condition.  The patient will follow up in 1 month for blood pressure check.   Medication Adjustments/Labs and Tests Ordered: Current medicines are reviewed at length with the patient today.  Concerns regarding medicines are outlined above.  Orders Placed This Encounter  Procedures  . Basic Metabolic Panel (BMET)  . Magnesium   Meds ordered this encounter  Medications  . furosemide (LASIX) 40 MG tablet    Sig: Take 1 tablet (40 mg total) by mouth daily.    Dispense:  90 tablet    Refill:  1  . potassium chloride SA (KLOR-CON M20) 20 MEQ tablet    Sig: Take 1 tablet (20 mEq total) by mouth daily.    Dispense:  90 tablet    Refill:  1    Patient Instructions  Medication Instructions:  Your physician has recommended you make the following change in your medication:   INCREASE: Furosemide to 40 mg Take 1 tab daily ( take 2 tabs of 20 mg daily until you run out) START: Potassium 20 meq Take 1 tab daily  PLEASE BRING MEDICATION BOTTLES AT NEXT VISIT  *If you need a refill on your cardiac medications  before your next appointment, please call your pharmacy*  Lab Work: Your physician recommends that you return for lab work [id number] BMP,MGNESIUM  If you have labs (blood work) drawn today and your tests are completely normal, you will receive your results only by: Marland Kitchen MyChart Message (if you have  MyChart) OR . A paper copy in the mail If you have any lab test that is abnormal or we need to change your treatment, we will call you to review the results.  Testing/Procedures: nONE  Follow-Up: At [facility], you and your health needs are our priority.  As part of our continuing mission to provide you with exceptional heart care, we have created designated Provider Care Teams.  These Care Teams include your primary Cardiologist (physician) and Advanced Practice Providers (APPs -  Physician Assistants and Nurse Practitioners) who all work together to provide you with the care you need, when you need it.  Your next appointment:   1 month(s)  The format for your next appointment:   In Person  Provider:   Berniece Salines, DO  Other Instructions      Adopting a Healthy Lifestyle.  Know what a healthy weight is for you (roughly BMI <25) and aim to maintain this   Aim for 7+ servings of fruits and vegetables daily   65-80+ fluid ounces of water or unsweet tea for healthy kidneys   Limit to max 1 drink of alcohol per day; avoid smoking/tobacco   Limit animal fats in diet for cholesterol and heart health - choose grass fed whenever available   Avoid highly processed foods, and foods high in saturated/trans fats   Aim for low stress - take time to unwind and care for your mental health   Aim for 150 min of moderate intensity exercise weekly for heart health, and weights twice weekly for bone health   Aim for 7-9 hours of sleep daily   When it comes to diets, agreement about the perfect plan isnt easy to find, even among the experts. Experts at the [place]  developed an idea known as the Healthy Eating Plate. Just imagine a plate divided into logical, healthy portions.   The emphasis is on diet quality:   Load up on vegetables and fruits - one-half of your plate: Aim for color and variety, and remember that potatoes dont count.   Go for whole grains - one-quarter of your plate: Whole wheat, barley, wheat berries, quinoa, oats, brown rice, and foods made with them. If you want pasta, go with whole wheat pasta.   Protein power - one-quarter of your plate: Fish, chicken, beans, and nuts are all healthy, versatile protein sources. Limit red meat.   The diet, however, does go beyond the plate, offering a few other suggestions.   Use healthy plant oils, such as olive, canola, soy, corn, sunflower and peanut. Check the labels, and avoid partially hydrogenated oil, which have unhealthy trans fats.   If youre thirsty, drink water. Coffee and tea are good in moderation, but skip sugary drinks and limit milk and dairy products to one or two daily servings.   The type of carbohydrate in the diet is more important than the amount. Some sources of carbohydrates, such as vegetables, fruits, whole grains, and beans-are healthier than others.   Finally, stay active  Signed, Berniece Salines, DO  01/05/2019 12:56 PM    [facility]

## 2019-01-06 LAB — BASIC METABOLIC PANEL
BUN/Creatinine Ratio: 10 — ABNORMAL LOW (ref 12–28)
BUN: 23 mg/dL (ref 8–27)
CO2: 23 mmol/L (ref 20–29)
Calcium: 9 mg/dL (ref 8.7–10.3)
Chloride: 108 mmol/L — ABNORMAL HIGH (ref 96–106)
Creatinine, Ser: 2.42 mg/dL — ABNORMAL HIGH (ref 0.57–1.00)
GFR calc Af Amer: 24 mL/min/{1.73_m2} — ABNORMAL LOW (ref >59)
GFR calc non Af Amer: 21 mL/min/{1.73_m2} — ABNORMAL LOW (ref >59)
Glucose: 90 mg/dL (ref 65–99)
Potassium: 4.8 mmol/L (ref 3.5–5.2)
Sodium: 144 mmol/L (ref 134–144)

## 2019-01-06 LAB — MAGNESIUM: Magnesium: 1.9 mg/dL (ref 1.6–2.3)

## 2019-01-08 ENCOUNTER — Telehealth: Payer: Self-pay | Admitting: *Deleted

## 2019-01-08 NOTE — Telephone Encounter (Signed)
-----   Message from Berniece Salines, DO sent at 01/06/2019  9:26 PM EST ----- Labs stable

## 2019-01-08 NOTE — Telephone Encounter (Signed)
Telephone call to patient. Left message that labs were stable and to call the office with questions.

## 2019-02-05 ENCOUNTER — Encounter: Payer: Self-pay | Admitting: *Deleted

## 2019-02-05 ENCOUNTER — Ambulatory Visit (INDEPENDENT_AMBULATORY_CARE_PROVIDER_SITE_OTHER): Payer: Medicaid Other | Admitting: Cardiology

## 2019-02-05 ENCOUNTER — Encounter: Payer: Self-pay | Admitting: Cardiology

## 2019-02-05 ENCOUNTER — Other Ambulatory Visit: Payer: Self-pay

## 2019-02-05 VITALS — BP 102/60 | HR 57 | Ht 62.0 in | Wt 221.0 lb

## 2019-02-05 DIAGNOSIS — I10 Essential (primary) hypertension: Secondary | ICD-10-CM | POA: Diagnosis not present

## 2019-02-05 DIAGNOSIS — R600 Localized edema: Secondary | ICD-10-CM

## 2019-02-05 DIAGNOSIS — I493 Ventricular premature depolarization: Secondary | ICD-10-CM

## 2019-02-05 DIAGNOSIS — I472 Ventricular tachycardia: Secondary | ICD-10-CM

## 2019-02-05 DIAGNOSIS — I4729 Other ventricular tachycardia: Secondary | ICD-10-CM

## 2019-02-05 DIAGNOSIS — I471 Supraventricular tachycardia: Secondary | ICD-10-CM

## 2019-02-05 MED ORDER — FUROSEMIDE 40 MG PO TABS: 40.0000 mg | ORAL_TABLET | Freq: Every day | ORAL | 1 refills | Status: DC

## 2019-02-05 MED ORDER — PRAVASTATIN SODIUM 40 MG PO TABS: 40.0000 mg | ORAL_TABLET | Freq: Every day | ORAL | 1 refills | Status: DC

## 2019-02-05 MED ORDER — POTASSIUM CHLORIDE CRYS ER 20 MEQ PO TBCR: 20.0000 meq | EXTENDED_RELEASE_TABLET | Freq: Every day | ORAL | 1 refills | Status: DC

## 2019-02-05 MED ORDER — APIXABAN 2.5 MG PO TABS: 2.5000 mg | ORAL_TABLET | Freq: Two times a day (BID) | ORAL | 5 refills | Status: DC

## 2019-02-05 NOTE — Patient Instructions (Addendum)
Medication Instructions:  Your physician recommends that you continue on your current medications as directed. Please refer to the Current Medication list given to you today.  *If you need a refill on your cardiac medications before your next appointment, please call your pharmacy*  Lab Work: Your physician recommends that you return for lab work in: TODAY BMP,MAgnesium  If you have labs (blood work) drawn today and your tests are completely normal, you will receive your results only by: Marland Kitchen MyChart Message (if you have MyChart) OR . A paper copy in the mail If you have any lab test that is abnormal or we need to change your treatment, we will call you to review the results.  Testing/Procedures: Your physician has requested that you have a lexiscan myoview. For further information please visit [email or web address]. Please follow instruction sheet, as given.   Follow-Up: At [facility], you and your health needs are our priority.  As part of our continuing mission to provide you with exceptional heart care, we have created designated Provider Care Teams.  These Care Teams include your primary Cardiologist (physician) and Advanced Practice Providers (APPs -  Physician Assistants and Nurse Practitioners) who all work together to provide you with the care you need, when you need it.  Your next appointment:   3 month(s)  The format for your next appointment:   In Person  Provider:   Berniece Salines, DO  Other Instructions  Cardiac Nuclear Scan A cardiac nuclear scan is a test that is done to check the flow of blood to your heart. It is done when you are resting and when you are exercising. The test looks for problems such as:  Not enough blood reaching a portion of the heart.  The heart muscle not working as it should. You may need this test if:  You have heart disease.  You have had lab results that are not normal.  You have had heart surgery or a balloon procedure to open up  blocked arteries (angioplasty).  You have chest pain.  You have shortness of breath. In this test, a special dye (tracer) is put into your bloodstream. The tracer will travel to your heart. A camera will then take pictures of your heart to see how the tracer moves through your heart. This test is usually done at a hospital and takes 2-4 hours. Tell a doctor about:  Any allergies you have.  All medicines you are taking, including vitamins, herbs, eye drops, creams, and over-the-counter medicines.  Any problems you or family members have had with anesthetic medicines.  Any blood disorders you have.  Any surgeries you have had.  Any medical conditions you have.  Whether you are pregnant or may be pregnant. What are the risks? Generally, this is a safe test. However, problems may occur, such as:  Serious chest pain and heart attack. This is only a risk if the stress portion of the test is done.  Rapid heartbeat.  A feeling of warmth in your chest. This feeling usually does not last long.  Allergic reaction to the tracer. What happens before the test?  Ask your doctor about changing or stopping your normal medicines. This is important.  Follow instructions from your doctor about what you cannot eat or drink.  Remove your jewelry on the day of the test. What happens during the test?  An IV tube will be inserted into one of your veins.  Your doctor will give you a small amount of tracer through the  IV tube.  You will wait for 20-40 minutes while the tracer moves through your bloodstream.  Your heart will be monitored with an electrocardiogram (ECG).  You will lie down on an exam table.  Pictures of your heart will be taken for about 15-20 minutes.  You may also have a stress test. For this test, one of these things may be done: ? You will be asked to exercise on a treadmill or a stationary bike. ? You will be given medicines that will make your heart work harder. This  is done if you are unable to exercise.  When blood flow to your heart has peaked, a tracer will again be given through the IV tube.  After 20-40 minutes, you will get back on the exam table. More pictures will be taken of your heart.  Depending on the tracer that is used, more pictures may need to be taken 3-4 hours later.  Your IV tube will be removed when the test is over. The test may vary among doctors and hospitals. What happens after the test?  Ask your doctor: ? Whether you can return to your normal schedule, including diet, activities, and medicines. ? Whether you should drink more fluids. This will help to remove the tracer from your body. Drink enough fluid to keep your pee (urine) pale yellow.  Ask your doctor, or the department that is doing the test: ? When will my results be ready? ? How will I get my results? Summary  A cardiac nuclear scan is a test that is done to check the flow of blood to your heart.  Tell your doctor whether you are pregnant or may be pregnant.  Before the test, ask your doctor about changing or stopping your normal medicines. This is important.  Ask your doctor whether you can return to your normal activities. You may be asked to drink more fluids. This information is not intended to replace advice given to you by your health care provider. Make sure you discuss any questions you have with your health care provider. Document Revised: 04/26/2018 Document Reviewed: 06/20/2017 Elsevier Patient Education  [place].

## 2019-02-05 NOTE — Progress Notes (Signed)
Cardiology Office Note:    Date:  02/05/2019   ID:  Kelsey Leonard, DOB Jun 18, 1955, MRN [id number]  PCP:  Martinique, [name], MD  Cardiologist:  Berniece Salines, DO  Electrophysiologist:  None   Referring MD: [place]ique, [name], MD   Chief Complaint  Patient presents with  . Follow-up    1 month    History of Present Illness:    Kelsey Leonard is a 64 y.o. female with a hx of Heart Failure, Chronic Kidney Disease, DVT (on Eliquis), Hypertension, Hypothyroidism,.  Patient Is a Poor Historian.  I saw the patient on January 05, 2019 at that time she was hypertensive with  significant bilateral leg edema I therefore increase her Lasix from 20 mg daily to 40 mg daily with potassium supplements.  She is here today for follow-up visit.  She tells me that she has been taking her medications as prescribe.   Past Medical History:  Diagnosis Date  . Asthma   . CKD (chronic kidney disease)   . Depression   . DVT (deep venous thrombosis) ([place])   . 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 GERD (gastroesophageal reflux disease)   . Hypertension   . Hypothyroidism   . Low blood pressure 09/27/2018  . Osteoarthritis   . Schizophrenia ([place])   . Seizures ([place])   . Sepsis [facility])     Past Surgical History:  Procedure Laterality Date  . ABDOMINAL HYSTERECTOMY    . IR FLUORO GUIDE CV LINE RIGHT  08/24/2018  . IR REMOVAL TUN CV CATH W/O FL  09/29/2018  . IR US GUIDE VASC ACCESS RIGHT  08/24/2018  . TEE WITHOUT CARDIOVERSION N/A 08/22/2018   Procedure: TRANSESOPHAGEAL ECHOCARDIOGRAM (TEE);  Surgeon: Lelon Perla, MD;  Location: [facility] ENDOSCOPY;  Service: Cardiovascular;  Laterality: N/A;  . THYROIDECTOMY, PARTIAL      Current Medications: Current Meds  Medication Sig  . acetaminophen (TYLENOL) 500 MG tablet Take 1,000 mg by mouth every 6 (six) hours as needed for mild pain or moderate pain.  Marland Kitchen albuterol (VENTOLIN HFA) 108 (90 Base) MCG/ACT inhaler Inhale 2  puffs into the lungs every 6 (six) hours as needed for wheezing or shortness of breath.  Marland Kitchen apixaban (ELIQUIS) 2.5 MG TABS tablet Take 1 tablet (2.5 mg total) by mouth 2 (two) times daily.  Marland Kitchen aspirin EC 81 MG tablet Take 81 mg by mouth daily.  . benztropine (COGENTIN) 1 MG tablet Take 1 mg by mouth 2 (two) times daily.  . clonazePAM (KLONOPIN) 0.5 MG tablet Take 0.5 mg by mouth daily.  Marland Kitchen FANAPT 6 MG TABS TAKE ONE-HALF (1/2) TABLET BY MOUTH EVERY MORNING AND ONE  and  ONE-HALF (1  and  1/2) TABLETS BY MOUTH EVERY EVENING (ROUND WHITE TABLET WI  . furosemide (LASIX) 40 MG tablet Take 1 tablet (40 mg total) by mouth daily.  . nortriptyline (PAMELOR) 25 MG capsule Take 25 mg by mouth 2 (two) times daily.  . potassium chloride SA (KLOR-CON M20) 20 MEQ tablet Take 1 tablet (20 mEq total) by mouth daily.  . pravastatin (PRAVACHOL) 40 MG tablet Take 1 tablet (40 mg total) by mouth daily.  . sertraline (ZOLOFT) 50 MG tablet Take 50 mg by mouth at bedtime.  . solifenacin (VESICARE) 5 MG tablet Take 5 mg by mouth daily.  . traMADol (ULTRAM) 50 MG tablet Take 50 mg by mouth every 6 (six) hours as needed.  . [DISCONTINUED] apixaban (ELIQUIS)  2.5 MG TABS tablet Take 2.5 mg by mouth 2 (two) times daily.  . [DISCONTINUED] furosemide (LASIX) 40 MG tablet Take 1 tablet (40 mg total) by mouth daily.  . [DISCONTINUED] potassium chloride SA (KLOR-CON M20) 20 MEQ tablet Take 1 tablet (20 mEq total) by mouth daily.  . [DISCONTINUED] pravastatin (PRAVACHOL) 40 MG tablet Take 40 mg by mouth daily.  . [DISCONTINUED] sertraline (ZOLOFT) 100 MG tablet Take 100 mg by mouth daily.     Allergies:   Aspirin, Penicillin g, Penicillins, Shellfish allergy, and Sulfa antibiotics   Social History   Socioeconomic History  . Marital status: Single    Spouse name: Not on file  . Number of children: Not on file  . Years of education: Not on file  . Highest education level: Not on file  Occupational History  . Not on file    Tobacco Use  . Smoking status: Never Smoker  . Smokeless tobacco: Never Used  Substance and Sexual Activity  . Alcohol use: Never  . Drug use: Never  . Sexual activity: Not Currently  Other Topics Concern  . Not on file  Social History Narrative  . Not on file   Social Determinants of Health   Financial Resource Strain:   . Difficulty of Paying Living Expenses: Not on file  Food Insecurity:   . Worried About Charity fundraiser in the Last Year: Not on file  . Ran Out of Food in the Last Year: Not on file  Transportation Needs:   . Lack of Transportation (Medical): Not on file  . Lack of Transportation (Non-Medical): Not on file  Physical Activity:   . Days of Exercise per Week: Not on file  . Minutes of Exercise per Session: Not on file  Stress:   . Feeling of Stress : Not on file  Social Connections:   . Frequency of Communication with Friends and Family: Not on file  . Frequency of Social Gatherings with Friends and Family: Not on file  . Attends Religious Services: Not on file  . Active Member of Clubs or Organizations: Not on file  . Attends Archivist Meetings: Not on file  . Marital Status: Not on file     Family History: The patient's family history includes Diabetes in her brother and sister; Hypertension in her brother, mother, and sister.  ROS:   Review of Systems  Constitution: Negative for decreased appetite, fever and weight gain.  HENT: Negative for congestion, ear discharge, hoarse voice and sore throat.   Eyes: Negative for discharge, redness, vision loss in right eye and visual halos.  Cardiovascular: Negative for chest pain, dyspnea on exertion, leg swelling, orthopnea and palpitations.  Respiratory: Negative for cough, hemoptysis, shortness of breath and snoring.   Endocrine: Negative for heat intolerance and polyphagia.  Hematologic/Lymphatic: Negative for bleeding problem. Does not bruise/bleed easily.  Skin: Negative for flushing,  nail changes, rash and suspicious lesions.  Musculoskeletal: Negative for arthritis, joint pain, muscle cramps, myalgias, neck pain and stiffness.  Gastrointestinal: Negative for abdominal pain, bowel incontinence, diarrhea and excessive appetite.  Genitourinary: Negative for decreased libido, genital sores and incomplete emptying.  Neurological: Negative for brief paralysis, focal weakness, headaches and loss of balance.  Psychiatric/Behavioral: Negative for altered mental status, depression and suicidal ideas.  Allergic/Immunologic: Negative for HIV exposure and persistent infections.    EKGs/Labs/Other Studies Reviewed:    The following studies were reviewed today:   EKG: None today  ZIO monitor The patient wore  the monitor for 14 days starting 12/08/2018 . Indication: Palpitations The minimum heart rate was 45 bpm, maximum heart rate was 171 bpm, and average HR was 72 bpm. Predominant underlying rhythm was Sinus Rhythm.  4 Ventricular Tachycardia runs occurred, the run with the fastest interval lasting 4 beats with a maximum heart rate of 162bpm, the longest lasting 19 beats with an average heart rate of 113 bpm.  4 Supraventricular Tachycardia runs occurred, the run with the fastest interval lasting 5 beats with a max rate of 171 bpm, the longest lasting 12.2 secs with an avg rate of 106 bpm.  Premature atrial complexes were rare (<1.0%). Premature Ventricular complexes were  were occasional (2.8%,40197).  Ventricular Couplets were rare (<1.0%, 1233), and Ventricular Triplets were rare (<1.0%). Ventricular Bigeminy and Trigeminy were present.  No pauses, No AV block and no atrial fibrillation present. No patient triggered events or diary events are noted.  Conclusion: This study is remarkable for the following:                             1. 4 episodes of nonsustained ventricular tachycardia.                             2. 5 episodes of supraventricular tachycardia noted  which is likely atrial tachycardia with variable block.   Recent Labs: 08/12/2018: TSH 1.773 09/28/2018: ALT 11 09/29/2018: Hemoglobin 10.3; Platelets 91 01/05/2019: BUN 23; Creatinine, Ser 2.42; Magnesium 1.9; Potassium 4.8; Sodium 144  Recent Lipid Panel    Component Value Date/Time   TRIG 49 08/13/2018 0347    Physical Exam:    VS:  BP 102/60   Pulse (!) 57   Ht 5\' 2"  (1.575 m)   Wt 221 lb (100.2 kg)   SpO2 94%   BMI 40.42 kg/m     Wt Readings from Last 3 Encounters:  02/05/19 221 lb (100.2 kg)  01/05/19 232 lb (105.2 kg)  12/08/18 230 lb (104.3 kg)     GEN: Well nourished, well developed in no acute distress HEENT: Normal NECK: No JVD; No carotid bruits LYMPHATICS: No lymphadenopathy CARDIAC: S1S2 noted,RRR, no murmurs, rubs, gallops RESPIRATORY:  Clear to auscultation without rales, wheezing or rhonchi  ABDOMEN: Soft, non-tender, non-distended, +bowel sounds, no guarding. EXTREMITIES: No edema, No cyanosis, no clubbing MUSCULOSKELETAL:  No edema; No deformity  SKIN: Warm and dry NEUROLOGIC:  Alert and oriented x 3, non-focal PSYCHIATRIC:  Normal affect, good insight  ASSESSMENT:    1. NSVT (nonsustained ventricular tachycardia) ([place])   2. Essential hypertension   3. Bilateral leg edema   4. PVC (premature ventricular contraction)   5. Paroxysmal atrial tachycardia ([facility])    PLAN:    1.  Her monitor did show evidence of nonsustained ventricular tachycardia.  I would like to rule out ischemic etiology of this.  Therefore pharmacologic nuclear stress test will be ordered.  Educated patient about the testing she is agreeable to proceed.  2.  Her blood pressure is appropriate at this time.No changes will be made at this time.   3. She did responded favorably to the diuretics increase.  She has lost 11 pounds since her last visit. We will continue the patient on her Lasix 40mg  daily along with her potassium supplement.   The patient is in agreement with the above  plan. The patient left the office in stable condition.  The  patient will follow up in 3 months or sooner if needed.  Medication Adjustments/Labs and Tests Ordered: Current medicines are reviewed at length with the patient today.  Concerns regarding medicines are outlined above.  Orders Placed This Encounter  Procedures  . Basic Metabolic Panel (BMET)  . Magnesium  . MYOCARDIAL PERFUSION IMAGING   Meds ordered this encounter  Medications  . apixaban (ELIQUIS) 2.5 MG TABS tablet    Sig: Take 1 tablet (2.5 mg total) by mouth 2 (two) times daily.    Dispense:  60 tablet    Refill:  5  . furosemide (LASIX) 40 MG tablet    Sig: Take 1 tablet (40 mg total) by mouth daily.    Dispense:  90 tablet    Refill:  1  . potassium chloride SA (KLOR-CON M20) 20 MEQ tablet    Sig: Take 1 tablet (20 mEq total) by mouth daily.    Dispense:  90 tablet    Refill:  1  . pravastatin (PRAVACHOL) 40 MG tablet    Sig: Take 1 tablet (40 mg total) by mouth daily.    Dispense:  90 tablet    Refill:  1    Patient Instructions  Medication Instructions:  Your physician recommends that you continue on your current medications as directed. Please refer to the Current Medication list given to you today.  *If you need a refill on your cardiac medications before your next appointment, please call your pharmacy*  Lab Work: Your physician recommends that you return for lab work in: TODAY BMP,MAgnesium  If you have labs (blood work) drawn today and your tests are completely normal, you will receive your results only by: Marland Kitchen MyChart Message (if you have MyChart) OR . A paper copy in the mail If you have any lab test that is abnormal or we need to change your treatment, we will call you to review the results.  Testing/Procedures: Your physician has requested that you have a lexiscan myoview. For further information please visit [email or web address]. Please follow instruction sheet, as given.   Follow-Up: At [facility], you and your health needs are our priority.  As part of our continuing mission to provide you with exceptional heart care, we have created designated Provider Care Teams.  These Care Teams include your primary Cardiologist (physician) and Advanced Practice Providers (APPs -  Physician Assistants and Nurse Practitioners) who all work together to provide you with the care you need, when you need it.  Your next appointment:   3 month(s)  The format for your next appointment:   In Person  Provider:   Berniece Salines, DO  Other Instructions  Cardiac Nuclear Scan A cardiac nuclear scan is a test that is done to check the flow of blood to your heart. It is done when you are resting and when you are exercising. The test looks for problems such as:  Not enough blood reaching a portion of the heart.  The heart muscle not working as it should. You may need this test if:  You have heart disease.  You have had lab results that are not normal.  You have had heart surgery or a balloon procedure to open up blocked arteries (angioplasty).  You have chest pain.  You have shortness of breath. In this test, a special dye (tracer) is put into your bloodstream. The tracer will travel to your heart. A camera will then take pictures of your heart to see how the tracer moves through your heart.  This test is usually done at a hospital and takes 2-4 hours. Tell a doctor about:  Any allergies you have.  All medicines you are taking, including vitamins, herbs, eye drops, creams, and over-the-counter medicines.  Any problems you or family members have had with anesthetic medicines.  Any blood disorders you have.  Any surgeries you have had.  Any medical conditions you have.  Whether you are pregnant or may be pregnant. What are the risks? Generally, this is a safe test. However, problems may occur, such as:  Serious chest pain and heart attack. This is only a risk if the stress portion of  the test is done.  Rapid heartbeat.  A feeling of warmth in your chest. This feeling usually does not last long.  Allergic reaction to the tracer. What happens before the test?  Ask your doctor about changing or stopping your normal medicines. This is important.  Follow instructions from your doctor about what you cannot eat or drink.  Remove your jewelry on the day of the test. What happens during the test?  An IV tube will be inserted into one of your veins.  Your doctor will give you a small amount of tracer through the IV tube.  You will wait for 20-40 minutes while the tracer moves through your bloodstream.  Your heart will be monitored with an electrocardiogram (ECG).  You will lie down on an exam table.  Pictures of your heart will be taken for about 15-20 minutes.  You may also have a stress test. For this test, one of these things may be done: ? You will be asked to exercise on a treadmill or a stationary bike. ? You will be given medicines that will make your heart work harder. This is done if you are unable to exercise.  When blood flow to your heart has peaked, a tracer will again be given through the IV tube.  After 20-40 minutes, you will get back on the exam table. More pictures will be taken of your heart.  Depending on the tracer that is used, more pictures may need to be taken 3-4 hours later.  Your IV tube will be removed when the test is over. The test may vary among doctors and hospitals. What happens after the test?  Ask your doctor: ? Whether you can return to your normal schedule, including diet, activities, and medicines. ? Whether you should drink more fluids. This will help to remove the tracer from your body. Drink enough fluid to keep your pee (urine) pale yellow.  Ask your doctor, or the department that is doing the test: ? When will my results be ready? ? How will I get my results? Summary  A cardiac nuclear scan is a test that is done  to check the flow of blood to your heart.  Tell your doctor whether you are pregnant or may be pregnant.  Before the test, ask your doctor about changing or stopping your normal medicines. This is important.  Ask your doctor whether you can return to your normal activities. You may be asked to drink more fluids. This information is not intended to replace advice given to you by your health care provider. Make sure you discuss any questions you have with your health care provider. Document Revised: 04/26/2018 Document Reviewed: 06/20/2017 Elsevier Patient Education  [place].     Adopting a Healthy Lifestyle.  Know what a healthy weight is for you (roughly BMI <25) and aim to maintain  this   Aim for 7+ servings of fruits and vegetables daily   65-80+ fluid ounces of water or unsweet tea for healthy kidneys   Limit to max 1 drink of alcohol per day; avoid smoking/tobacco   Limit animal fats in diet for cholesterol and heart health - choose grass fed whenever available   Avoid highly processed foods, and foods high in saturated/trans fats   Aim for low stress - take time to unwind and care for your mental health   Aim for 150 min of moderate intensity exercise weekly for heart health, and weights twice weekly for bone health   Aim for 7-9 hours of sleep daily   When it comes to diets, agreement about the perfect plan isnt easy to find, even among the experts. Experts at the [employer] developed an idea known as the Healthy Eating Plate. Just imagine a plate divided into logical, healthy portions.   The emphasis is on diet quality:   Load up on vegetables and fruits - one-half of your plate: Aim for color and variety, and remember that potatoes dont count.   Go for whole grains - one-quarter of your plate: Whole wheat, barley, wheat berries, quinoa, oats, brown rice, and foods made with them. If you want pasta, go with whole wheat pasta.     Protein power - one-quarter of your plate: Fish, chicken, beans, and nuts are all healthy, versatile protein sources. Limit red meat.   The diet, however, does go beyond the plate, offering a few other suggestions.   Use healthy plant oils, such as olive, canola, soy, corn, sunflower and peanut. Check the labels, and avoid partially hydrogenated oil, which have unhealthy trans fats.   If youre thirsty, drink water. Coffee and tea are good in moderation, but skip sugary drinks and limit milk and dairy products to one or two daily servings.   The type of carbohydrate in the diet is more important than the amount. Some sources of carbohydrates, such as vegetables, fruits, whole grains, and beans-are healthier than others.   Finally, stay active  Signed, Berniece Salines, DO  02/05/2019 11:42 AM    [place]

## 2019-02-06 LAB — BASIC METABOLIC PANEL
BUN/Creatinine Ratio: 14 (ref 12–28)
BUN: 45 mg/dL — ABNORMAL HIGH (ref 8–27)
CO2: 23 mmol/L (ref 20–29)
Calcium: 9.1 mg/dL (ref 8.7–10.3)
Chloride: 106 mmol/L (ref 96–106)
Creatinine, Ser: 3.29 mg/dL — ABNORMAL HIGH (ref 0.57–1.00)
GFR calc Af Amer: 17 mL/min/{1.73_m2} — ABNORMAL LOW (ref >59)
GFR calc non Af Amer: 14 mL/min/{1.73_m2} — ABNORMAL LOW (ref >59)
Glucose: 82 mg/dL (ref 65–99)
Potassium: 5 mmol/L (ref 3.5–5.2)
Sodium: 144 mmol/L (ref 134–144)

## 2019-02-06 LAB — MAGNESIUM: Magnesium: 2.3 mg/dL (ref 1.6–2.3)

## 2019-02-07 ENCOUNTER — Telehealth: Payer: Self-pay | Admitting: *Deleted

## 2019-02-07 DIAGNOSIS — I10 Essential (primary) hypertension: Secondary | ICD-10-CM

## 2019-02-07 DIAGNOSIS — R600 Localized edema: Secondary | ICD-10-CM

## 2019-02-07 MED ORDER — FUROSEMIDE 40 MG PO TABS: ORAL_TABLET | ORAL | 1 refills | Status: DC

## 2019-02-07 NOTE — Telephone Encounter (Signed)
Telephone call to patient. Informed of labs and need to hold Lasix for 3 days then restart Lasix 40 mg on Tues,Thurs, and SAturdays then repeat labs on Monday. Pt verbalized understanding.

## 2019-02-07 NOTE — Telephone Encounter (Signed)
-----   Message from Berniece Salines, DO sent at 02/06/2019 10:16 AM EST ----- Please have the patient hold her Lasix for 3 days and restart her Lasix at 40 mg Tuesday Thursdays and Saturdays.  Please have her come on Monday for BMP and magnesium. Also let her know that she needs to follow-up with a hematologist I would not be able to refill her Eliquis as it was ordered for pulmonary embolism and DVT.

## 2019-02-13 LAB — BASIC METABOLIC PANEL
BUN/Creatinine Ratio: 12 (ref 12–28)
BUN: 34 mg/dL — ABNORMAL HIGH (ref 8–27)
CO2: 22 mmol/L (ref 20–29)
Calcium: 9.5 mg/dL (ref 8.7–10.3)
Chloride: 110 mmol/L — ABNORMAL HIGH (ref 96–106)
Creatinine, Ser: 2.93 mg/dL — ABNORMAL HIGH (ref 0.57–1.00)
GFR calc Af Amer: 19 mL/min/{1.73_m2} — ABNORMAL LOW (ref >59)
GFR calc non Af Amer: 17 mL/min/{1.73_m2} — ABNORMAL LOW (ref >59)
Glucose: 86 mg/dL (ref 65–99)
Potassium: 5.8 mmol/L — ABNORMAL HIGH (ref 3.5–5.2)
Sodium: 144 mmol/L (ref 134–144)

## 2019-02-14 ENCOUNTER — Telehealth: Payer: Self-pay | Admitting: *Deleted

## 2019-02-14 DIAGNOSIS — I10 Essential (primary) hypertension: Secondary | ICD-10-CM

## 2019-02-14 NOTE — Telephone Encounter (Signed)
-----   Message from Berniece Salines, DO sent at 02/14/2019  8:33 AM EST ----- Creatinine is getting better, potassium elevated.  Hold potassium for 1 week, continue the Lasix: Tuesday, Thursday, Saturday repeat BMP in 1 weeks.

## 2019-02-14 NOTE — Telephone Encounter (Signed)
Telephone call to patient. Informed of labs and need to hold potassium for 1 week,continue lasix on tues,thurs, and saturdays and repeat BMP in 1 week

## 2019-03-07 ENCOUNTER — Telehealth: Payer: Self-pay | Admitting: *Deleted

## 2019-03-07 NOTE — Telephone Encounter (Signed)
Patient given detailed instructions per Myocardial Perfusion Study Information Sheet for the test on 03/13/2019 at 1130. Patient notified to arrive 15 minutes early and that it is imperative to arrive on time for appointment to keep from having the test rescheduled.  If you need to cancel or reschedule your appointment, please call the office within 24 hours of your appointment. . Patient verbalized understanding..m3 No mychart

## 2019-03-13 ENCOUNTER — Ambulatory Visit (INDEPENDENT_AMBULATORY_CARE_PROVIDER_SITE_OTHER): Payer: Medicaid Other

## 2019-03-13 ENCOUNTER — Other Ambulatory Visit: Payer: Self-pay

## 2019-03-13 ENCOUNTER — Inpatient Hospital Stay
Admission: EM | Admit: 2019-03-13 | Discharge: 2019-03-18 | DRG: 866 | Disposition: A | Discharge status: Home / Self Care | Payer: BC Managed Care – PPO | Attending: Internal Medicine | Admitting: Internal Medicine

## 2019-03-13 DIAGNOSIS — B027 Disseminated zoster: Principal | ICD-10-CM | POA: Diagnosis present

## 2019-03-13 DIAGNOSIS — Z9081 Acquired absence of spleen: Secondary | ICD-10-CM

## 2019-03-13 DIAGNOSIS — E871 Hypo-osmolality and hyponatremia: Secondary | ICD-10-CM | POA: Diagnosis present

## 2019-03-13 DIAGNOSIS — B019 Varicella without complication: Secondary | ICD-10-CM | POA: Diagnosis present

## 2019-03-13 DIAGNOSIS — D72828 Other elevated white blood cell count: Secondary | ICD-10-CM | POA: Diagnosis not present

## 2019-03-13 DIAGNOSIS — Z96652 Presence of left artificial knee joint: Secondary | ICD-10-CM | POA: Diagnosis present

## 2019-03-13 DIAGNOSIS — R21 Rash and other nonspecific skin eruption: Secondary | ICD-10-CM

## 2019-03-13 DIAGNOSIS — E876 Hypokalemia: Secondary | ICD-10-CM | POA: Diagnosis present

## 2019-03-13 DIAGNOSIS — G629 Polyneuropathy, unspecified: Secondary | ICD-10-CM | POA: Diagnosis present

## 2019-03-13 DIAGNOSIS — Q8901 Asplenia (congenital): Secondary | ICD-10-CM

## 2019-03-13 DIAGNOSIS — I472 Ventricular tachycardia: Secondary | ICD-10-CM | POA: Diagnosis not present

## 2019-03-13 LAB — HEPATIC FUNCTION PANEL
ALT: 26 U/L (ref 0–55)
AST (SGOT): 28 U/L (ref 10–42)
Albumin/Globulin Ratio: 0.89 Ratio (ref 0.80–2.00)
Albumin: 4 gm/dL (ref 3.5–5.0)
Alkaline Phosphatase: 75 U/L (ref 40–145)
Bilirubin Direct: 0.1 mg/dL (ref 0.0–0.3)
Bilirubin, Total: 0.3 mg/dL (ref 0.1–1.2)
Globulin: 4.5 gm/dL — ABNORMAL HIGH (ref 2.0–4.0)
Protein, Total: 8.5 gm/dL — ABNORMAL HIGH (ref 6.0–8.3)

## 2019-03-13 LAB — BASIC METABOLIC PANEL
Anion Gap: 11.6 mMol/L (ref 7.0–18.0)
BUN / Creatinine Ratio: 27.5 Ratio (ref 10.0–30.0)
BUN: 22 mg/dL (ref 7–22)
CO2: 26 mMol/L (ref 20–30)
Calcium: 8.6 mg/dL (ref 8.5–10.5)
Chloride: 99 mMol/L (ref 98–110)
Creatinine: 0.8 mg/dL (ref 0.60–1.20)
EGFR: 80 mL/min/{1.73_m2} (ref 60–150)
Glucose: 97 mg/dL (ref 71–99)
Osmolality Calculated: 270 mOsm/kg — ABNORMAL LOW (ref 275–300)
Potassium: 3.6 mMol/L (ref 3.5–5.3)
Sodium: 133 mMol/L — ABNORMAL LOW (ref 136–147)

## 2019-03-13 LAB — CBC AND DIFFERENTIAL
Bands: 11 % — ABNORMAL HIGH (ref 0–10)
Basophils %: 1 % (ref 0.0–3.0)
Basophils Absolute: 0.1 10*3/uL (ref 0.0–0.3)
Eosinophils %: 1 % (ref 0.0–7.0)
Eosinophils Absolute: 0.1 10*3/uL (ref 0.0–0.8)
Hematocrit: 46.2 % (ref 36.0–48.0)
Hemoglobin: 15.3 gm/dL (ref 12.0–16.0)
Lymphocytes Absolute: 1.1 10*3/uL (ref 0.6–5.1)
Lymphocytes: 18 % (ref 15.0–46.0)
MCH: 32 pg (ref 28–35)
MCHC: 33 gm/dL (ref 32–36)
MCV: 98 fL (ref 80–100)
MPV: 8.6 fL (ref 6.0–10.0)
Monocytes Absolute: 0.4 10*3/uL (ref 0.1–1.7)
Monocytes: 6 % (ref 3.0–15.0)
Neutrophils %: 63 % (ref 42.0–78.0)
Neutrophils Absolute: 4.7 10*3/uL (ref 1.7–8.6)
PLT CT: 292 10*3/uL (ref 130–440)
RBC: 4.74 10*6/uL (ref 3.80–5.00)
RDW: 11.9 % (ref 11.0–14.0)
WBC: 6.3 10*3/uL (ref 4.0–11.0)

## 2019-03-13 LAB — C-REACTIVE PROTEIN: C-Reactive Protein: 8.17 mg/dL — ABNORMAL HIGH (ref 0.02–0.80)

## 2019-03-13 LAB — MYOCARDIAL PERFUSION IMAGING
Peak HR: 86 {beats}/min
Rest HR: 65 {beats}/min
SDS: 4
SRS: 0
SSS: 4
TID: 1.11

## 2019-03-13 MED ORDER — SODIUM CHLORIDE (PF) 0.9 % IJ SOLN
3.00 mL | Freq: Three times a day (TID) | INTRAMUSCULAR | Status: DC
Start: 2019-03-13 — End: 2019-03-18
  Administered 2019-03-15 – 2019-03-17 (×2): 3 mL via INTRAVENOUS

## 2019-03-13 MED ORDER — ACETAMINOPHEN 650 MG RE SUPP
650.00 mg | RECTAL | Status: DC | PRN
Start: 2019-03-13 — End: 2019-03-18

## 2019-03-13 MED ORDER — PREDNISONE 20 MG PO TABS
60.0000 mg | ORAL_TABLET | Freq: Every morning | ORAL | Status: DC
Start: 2019-03-13 — End: 2019-03-17
  Administered 2019-03-13 – 2019-03-17 (×5): 60 mg via ORAL
  Filled 2019-03-13 (×5): qty 3

## 2019-03-13 MED ORDER — NALOXONE HCL 0.4 MG/ML IJ SOLN (WRAP)
0.40 mg | INTRAMUSCULAR | Status: DC | PRN
Start: 2019-03-13 — End: 2019-03-18

## 2019-03-13 MED ORDER — PREDNISONE 20 MG PO TABS
ORAL_TABLET | ORAL | Status: AC
Start: 2019-03-13 — End: ?
  Filled 2019-03-13: qty 3

## 2019-03-13 MED ORDER — ACYCLOVIR SODIUM 50 MG/ML IV SOLN
10.00 mg/kg | Freq: Three times a day (TID) | INTRAVENOUS | Status: DC
Start: 2019-03-13 — End: 2019-03-18
  Administered 2019-03-13 – 2019-03-18 (×15): 600 mg via INTRAVENOUS
  Filled 2019-03-13 (×16): qty 12

## 2019-03-13 MED ORDER — VH POTASSIUM CHLORIDE CRYS ER 20 MEQ PO TBCR (WRAP)
40.00 meq | EXTENDED_RELEASE_TABLET | Freq: Once | ORAL | Status: AC
Start: 2019-03-13 — End: 2019-03-13
  Administered 2019-03-13: 23:00:00 40 meq via ORAL
  Filled 2019-03-13 (×2): qty 2

## 2019-03-13 MED ORDER — IBUPROFEN 600 MG PO TABS
ORAL_TABLET | ORAL | Status: AC
Start: 2019-03-13 — End: ?
  Filled 2019-03-13: qty 1

## 2019-03-13 MED ORDER — ENOXAPARIN SODIUM 40 MG/0.4ML SC SOLN
40.00 mg | SUBCUTANEOUS | Status: DC
Start: 2019-03-13 — End: 2019-03-18
  Administered 2019-03-13 – 2019-03-16 (×4): 40 mg via SUBCUTANEOUS
  Filled 2019-03-13 (×9): qty 0.4

## 2019-03-13 MED ORDER — ACETAMINOPHEN 325 MG PO TABS
650.0000 mg | ORAL_TABLET | ORAL | Status: DC | PRN
Start: 2019-03-13 — End: 2019-03-18
  Administered 2019-03-14 (×2): 650 mg via ORAL
  Filled 2019-03-13 (×2): qty 2

## 2019-03-13 MED ORDER — GABAPENTIN 100 MG PO CAPS
100.00 mg | ORAL_CAPSULE | Freq: Every evening | ORAL | Status: DC
Start: 2019-03-13 — End: 2019-03-18
  Administered 2019-03-13 – 2019-03-17 (×5): 100 mg via ORAL
  Filled 2019-03-13 (×5): qty 1

## 2019-03-13 MED ORDER — IBUPROFEN 600 MG PO TABS
600.0000 mg | ORAL_TABLET | Freq: Once | ORAL | Status: AC
Start: 2019-03-13 — End: 2019-03-13
  Administered 2019-03-13: 16:00:00 600 mg via ORAL

## 2019-03-13 MED ORDER — FLUORESCEIN SODIUM 1 MG OP STRP
ORAL_STRIP | OPHTHALMIC | Status: AC
Start: 2019-03-13 — End: ?
  Filled 2019-03-13: qty 2

## 2019-03-13 MED ORDER — ACETAMINOPHEN 160 MG/5ML PO SOLN
650.00 mg | ORAL | Status: DC | PRN
Start: 2019-03-13 — End: 2019-03-18

## 2019-03-13 MED ORDER — HYDROCODONE-ACETAMINOPHEN 5-325 MG PO TABS
1.0000 | ORAL_TABLET | ORAL | Status: DC | PRN
Start: 2019-03-13 — End: 2019-03-18
  Administered 2019-03-13: 1 via ORAL
  Filled 2019-03-13: qty 2

## 2019-03-13 MED ORDER — PREDNISONE 20 MG PO TABS
60.0000 mg | ORAL_TABLET | Freq: Once | ORAL | Status: DC
Start: 2019-03-13 — End: 2019-03-13

## 2019-03-13 MED ORDER — SODIUM CHLORIDE 0.9 % IV SOLN
INTRAVENOUS | Status: DC
Start: 2019-03-13 — End: 2019-03-18

## 2019-03-13 MED ORDER — ADENOSINE (DIAGNOSTIC) 3 MG/ML IV SOLN
0.5600 mg/kg | Freq: Once | INTRAVENOUS | Status: AC
  Administered 2019-03-13: 56.1 mg via INTRAVENOUS

## 2019-03-13 MED ORDER — TECHNETIUM TC 99M TETROFOSMIN IV KIT
10.9000 | PACK | Freq: Once | INTRAVENOUS | Status: AC | PRN
  Administered 2019-03-13: 10.9 via INTRAVENOUS

## 2019-03-13 MED ORDER — TECHNETIUM TC 99M TETROFOSMIN IV KIT
30.2000 | PACK | Freq: Once | INTRAVENOUS | Status: AC | PRN
  Administered 2019-03-13: 30.2 via INTRAVENOUS

## 2019-03-13 NOTE — ED Notes (Signed)
Radio broadcast assistant and patient's primary floor nurse to inform them of patient's contact isolation status.

## 2019-03-13 NOTE — Plan of Care (Signed)
Problem: Moderate/High Fall Risk Score >5  Goal: Patient will remain free of falls  Outcome: Progressing     Problem: Pain interferes with ability to perform ADL  Goal: Pain at adequate level as identified by patient  Outcome: Progressing     Problem: Side Effects from Pain Analgesia  Goal: Patient will experience minimal side effects of analgesic therapy  Outcome: Progressing     Problem: Compromised Tissue integrity  Goal: Damaged tissue is healing and protected  Outcome: Progressing  Goal: Nutritional status is improving  Outcome: Progressing

## 2019-03-13 NOTE — ED Notes (Signed)
[facility] EMERGENCY DEPARTMENT  ED NURSING NOTE FOR THE RECEIVING INPATIENT NURSE   ED NURSE PHONE: [phone]    ADMISSION INFORMATION   Peggy Hernandez is a 64 y.o. female admitted with an ED diagnosis of:  1. Disseminated herpes zoster    2. Asplenia      ED Pre-Departure Assessment:  Patient is alert and oriented X 4.  Currently using a cane for ambulating due to pain in her leg from herpes zoster.  Patient does not normally use a cane.  Only complaint at this time is pain at the site of her herpes lesions   NURSING CARE   Isolation Contact Herpes and droplet   Home O2 No   Patient Comes From: Home Independent   Mental Status: alert and oriented   Ambulation: no difficulty   Pertinent Info/Safety Concern: None

## 2019-03-13 NOTE — H&P (Addendum)
Medicine History & Physical   [facility]  [employer]   Patient Name: Peggy Hernandez, Peggy Hernandez LOS: 0 days   Attending Physician: Ermalene Postin, MD PCP: Patsy Lager, MD      Assessment and Plan:                                                              Disseminated varicella-zoster infection  Acute neuritis/neuropathic pain reported  Hyponatremia  Hypokalemia  Asplenia history  -Patient reported initially started 5 days ago soreness to her left hip.  It was initially thought maybe she had pulled a muscle and her PCP started her on Flexeril and prednisone.  -Rash developed around the site on Saturday.  PCP called in tramadol as well as Valtrex.  -Her rash continues to spread including lower leg as well as abdomen and back.  Patient called her PCP who directed her to the emergency department for further evaluation  -Due to red spots side of right nares and bridge of the nose, slit-lamp examination of the eye done by ER physician and PA.  No documented injury to the cornea reported.  -Acyclovir IV initiated and IV fluid to avoid crystallization in kidney initiated  -CRP 8.17.  BUN 22, creatinine 0.80, GFR 80, serum sodium 133, potassium 3.6  -Potassium repleted.  IV fluid given with repeat BMP in a.m.  -As needed pain medications available  -Tele-ID consult placed  -Contact and droplet precaution initiated  -Patient was given Norco which aggravated the pain.  Patient described it as being on fire and rated the pain especially to the leg 7-8/10 on pain scale.  -Prednisone 60 mg initiated as well as gabapentin 100 mg p.o. nightly for now    DVT PPx: Lovenox   Dispo: Inpatient  Healthcare Proxy: Daughter Fredrich Romans  Code: full code     History of Presenting Illness                                CC: Generalized rash  Peggy Hernandez is a 64 y.o. female patient past medical history of asplenia who presented to the emergency department for suspected disseminated varicella-zoster infection.   Patient reported initially started left hip with soreness.  It was thought maybe patient might of pulled a muscle.  Rash developed Saturday.  Primary care physician called in tramadol as well as Valtrex.  Rash started to spread to the lower legs as well as abdomen and back.  PCP referred her to the emergency department for further evaluation.  Patient reported taking Valtrex for only 2 days.  Due to the extent of her rash with scattered small vesicles including behind right ear, multiple spots on face especially next to right nares and bridge of the nose, ER physician and PA performed a slit lamp examination.  No obvious injury to the cornea reported.  Acyclovir was initiated.  Contact and droplet precautions initiated.  Patient interviewed by this physician at bedside.  Patient denies any known exposure, fever, chest pain, palpitations, shortness of breath, abdominal pain, nausea, vomiting, dysuria or generalized weakness.  Patient unaware if she has had chickenpox as a child.  Past Medical History:   Diagnosis Date    Shingles  Past Surgical History:   Procedure Laterality Date    SPLENECTOMY         History reviewed. No pertinent family history.  Social History     Tobacco Use    Smoking status: Never Smoker    Smokeless tobacco: Never Used   Substance Use Topics    Alcohol use: Not on file    Drug use: Not on file       Subjective   Review of Systems:  Review of systems as noted in HPI. Full 12 point ROS otherwise negative.         Objective   Physical Exam:     Vitals: T:98.8 F (37.1 C) (Temporal),  BP:148/86, HR:(!) 104, RR:18, SaO2:97%    1) General Appearance: Alert and oriented x 4. In no acute distress.   2) Eyes: Pink conjunctiva, anicteric sclera. Pupils are equally reactive to light.  3) ENT: Oral mucosa moist with no pharyngeal congestion, erythema or swelling.  No vesicles to tip of the nose, eyelids or inside the ear upon examination  4) Neck: Supple, with full range of motion. Trachea is  central, no JVD noted  5) Chest: Clear to auscultation bilaterally, no wheezes or rhonchi.  6) CVS: normal rate and regular rhythm, with no murmurs.  7) Abdomen: Soft, non-tender, no palpable mass. Bowel sounds normal. No CVA tenderness  8) Extremities: No pitting edema, pulses palpable, no calf swelling and no gross deformity.  9) Skin: Scattered areas of the regular sized raised redness with vesicles along the face especially around the neck and posterior right ear, across chest, scattered areas of the back, and worsening spots medial aspect of the left thigh.    10) Lymphatics: No lymphadenopathy in axillary, cervical and inguinal area.   11) Neurological: Cranial nerves II-XII intact. No gross focal motor or sensory deficits noted.  12) Psychiatric: Affect is appropriate. No hallucinations.        Patient Vitals for the past 12 hrs:   BP Temp Pulse Resp   03/13/19 1618 148/86      03/13/19 1427 (!) 137/94 98.8 F (37.1 C) (!) 104 18     Weight Monitoring 03/13/2019   Height 167.6 cm   Height Method Actual   Weight 73.483 kg   Weight Method Stated   BMI (calculated) 26.2 kg/m2          Recent Results (from the past 24 hour(s))   CBC and differential    Collection Time: 03/13/19  4:02 PM   Result Value Ref Range    WBC 6.3 4.0 - 11.0 K/cmm    RBC 4.74 3.80 - 5.00 M/cmm    Hemoglobin 15.3 12.0 - 16.0 gm/dL    Hematocrit 16.1 09.6 - 48.0 %    MCV 98 80 - 100 fL    MCH 32 28 - 35 pg    MCHC 33 32 - 36 gm/dL    RDW 04.5 40.9 - 81.1 %    PLT CT 292 130 - 440 K/cmm    MPV 8.6 6.0 - 10.0 fL    Neutrophils % 63.0 42.0 - 78.0 %    Lymphocytes 18.0 15.0 - 46.0 %    Monocytes 6.0 3.0 - 15.0 %    Eosinophils % 1.0 0.0 - 7.0 %    Basophils % 1.0 0.0 - 3.0 %    Bands 11 (H) 0 - 10 %    Neutrophils Absolute 4.7 1.7 - 8.6 K/cmm    Lymphocytes Absolute  1.1 0.6 - 5.1 K/cmm    Monocytes Absolute 0.4 0.1 - 1.7 K/cmm    Eosinophils Absolute 0.1 0.0 - 0.8 K/cmm    Basophils Absolute 0.1 0.0 - 0.3 K/cmm    RBC Morphology Morphology  Consistent with Hemogram    Basic Metabolic Panel    Collection Time: 03/13/19  4:02 PM   Result Value Ref Range    Sodium 133 (L) 136 - 147 mMol/L    Potassium 3.6 3.5 - 5.3 mMol/L    Chloride 99 98 - 110 mMol/L    CO2 26 20 - 30 mMol/L    Calcium 8.6 8.5 - 10.5 mg/dL    Glucose 97 71 - 99 mg/dL    Creatinine 5.40 9.81 - 1.20 mg/dL    BUN 22 7 - 22 mg/dL    Anion Gap 19.1 7.0 - 18.0 mMol/L    BUN / Creatinine Ratio 27.5 10.0 - 30.0 Ratio    EGFR 80 60 - 150 mL/min/1.17m2    Osmolality Calculated 270 (L) 275 - 300 mOsm/kg   Hepatic function panel (LFT)    Collection Time: 03/13/19  4:02 PM   Result Value Ref Range    Protein, Total 8.5 (H) 6.0 - 8.3 gm/dL    Albumin 4.0 3.5 - 5.0 gm/dL    Alkaline Phosphatase 75 40 - 145 U/L    ALT 26 0 - 55 U/L    AST (SGOT) 28 10 - 42 U/L    Bilirubin, Total 0.3 0.1 - 1.2 mg/dL    Bilirubin Direct 0.1 0.0 - 0.3 mg/dL    Albumin/Globulin Ratio 0.89 0.80 - 2.00 Ratio    Globulin 4.5 (H) 2.0 - 4.0 gm/dL   C Reactive Protein    Collection Time: 03/13/19  4:02 PM   Result Value Ref Range    C-Reactive Protein 8.17 (H) 0.02 - 0.80 mg/dL        No Known Allergies   No results found.   Home Medications     Med List Status: In Progress Set By: Romana Juniper, RN at 03/13/2019  2:35 PM                traMADol (ULTRAM) 50 MG tablet     Take 50 mg by mouth every 6 (six) hours as needed for Pain     valacyclovir (VALTREX) 1000 MG tablet     Take 1,000 mg by mouth 2 (two) times daily         Meds given in the ED:  Medications   acyclovir (ZOVIRAX) 600 mg in sodium chloride 0.9 % 250 mL IVPB (600 mg Intravenous New Bag 03/13/19 1742)   ibuprofen (ADVIL) tablet 600 mg (600 mg Oral Given 03/13/19 1614)   predniSONE (DELTASONE) 20 MG tablet (has no administration in time range)   ibuprofen (ADVIL) 600 MG tablet (has no administration in time range)   fluorescein 1 MG ophthalmic strip (has no administration in time range)      Time Spent:     [employer], DO     03/13/19,6:07 PM   MRN: [id number]                                       CSN: [id number] DOB: [birth year]

## 2019-03-13 NOTE — ED Triage Notes (Signed)
PT reports today with red, inflamed area on left leg, arm and face. She states she was placed on Tramadol and Valacyclovir  yesterday by her PCP and states that she called today and they told her she needed to come here for antibiotics.

## 2019-03-13 NOTE — Nursing Progress Note (Addendum)
NURSE NOTE SUMMARY  [facility] [address]   Patient Name: Peggy Hernandez   Attending Physician: Ronny Flurry, DO   Today's date:   03/13/2019 LOS: 0 days   Shift Summary:                                                              2257: patient arrived to the floor in stable condition. AAOX4. Plan of care reviewed with the patient. Fall education completed. Patient instructed to press the call light for assistance and stated understanding. Patient complained of pain to left leg and a burning and sharp sensation to LLE and lower back where she has lesions. norco given. When reassessing, patient reports increased pain after receiving norco. Dr Waynetta Sandy notified.   No pressure injuries noted. All safety measures in place. Will continue to monitor.    0600: patient stable, complains of headache that never goes away since she got sick. Tylenol administered during shift and ice packs applied to leg overnight and cold compress to her head. IV fluids infusing. All safety measures in place. Will continue to monitor.     0700: patient stable, report given to day nurse.   Provider Notifications:   2237: Dr Waynetta Sandy notified on increased pain reported by patient. See orders.      Rapid Response Notifications:  Mobility:      PMP Activity: Step 6 - Walks in Room (03/13/2019  8:15 PM)     Weight tracking:  Family Dynamic:   Last 3 Weights for the past 72 hrs (Last 3 readings):   Weight   03/13/19 1427 73.5 kg (162 lb)             Last Bowel Movement   No data recorded

## 2019-03-13 NOTE — ED Provider Notes (Signed)
[facility]  EMERGENCY DEPARTMENT  History and Physical Exam     Patient Name: Peggy Hernandez, Peggy Hernandez  Encounter Date:  03/13/2019  Attending Physician: Ermalene Postin, MD  Room:  4528/4528-A  Patient DOB:  1955-06-11  Age: 64 y.o. female  MRN:  [id number]  PCP: Patsy Lager, MD      Diagnosis/Disposition:  MDM:     Final Impression  1. Disseminated herpes zoster    2. Asplenia        Disposition  ED Disposition     ED Disposition Condition Date/Time Comment    Admit  Tue Mar 13, 2019  4:11 PM           Follow up  No follow-up provider specified.    Prescriptions  Current Discharge Medication List          Old medical records and nursing/triage notes were reviewed by myself.      The patient was evaluated in the Emergency Department for disseminated herpes zoster in the setting of an asplenic patient.  Patient was well-appearing in no acute distress during her stay in the emergency department.  She was afebrile.  She had a large patch of a vesicular rash on her left buttocks and anterior left thigh as well as patches of vesicular rashes on her right side of her face.  Thorough exam of her ears revealed no lesions.  Fluorescein staining of her eyes and slit lamp exam revealed no areas of fluorescein uptake and a quiet anterior chamber.  She had no lesions on her nose.  Due to the extensive nature of her rash, I recommended admission for further management and work-up of a possible immune compromised condition.  Patient voiced understanding and agreement this plan.  I discussed the patient with the admitting physician who assessed the patient in the emergency department.  The differential diagnosis includes, but is not limited to cellulitis, abscess, impetigo, hidradenitis suppuritiva, meningococcemia, toxic shock syndrome, RMSF, Lyme disseminated GC, syphilis, scarlet fever, pityriasis rosea, viral exanthem, sunburn.         I discussed this case with Dr Retta Diones in the emergency department who also  directly examined the patient and agrees with the assessment and treatment plan.     In addition to the above history, please see nursing notes. Allergies, meds, past medical, family, social hx, and the results of the diagnostic studies performed have been reviewed by myself.    This chart was generated by an EMR and may contain errors or omissions not intended by the user.        History of Presenting Illness:     Chief complaint: Herpes Zoster      HPI/ROS is limited by: none  HPI/ROS given by: patient    Peggy Hernandez is a 64 y.o. female presenting with pain associated with what she thinks is a herpes zoster infection.  Patient says that she has had a rash that broke out with spots on Saturday.  She began to feel pain on Thursday.  She says that she has a rash on her left buttock and left leg but she also has a rash on her right side of her face around her eye.  She denies any ocular involvement.  She denies any oral involvement.  She says that she has a history of chickenpox when she was a child.  She initially presented to her primary care provider last Thursday who thought her that her pain was due to a muscle strain  and started her on a muscle relaxer and prednisone.  When she returned with the rash they started her on tramadol and Valtrex.  They told her to come to the emergency department for "IV pain medicine".  She denies any fever or shaking chills.        Review of Systems:  Physical Exam:     Review of Systems   Constitutional: Negative for chills and fever.   Respiratory: Negative for shortness of breath.    Cardiovascular: Negative for chest pain.   Skin: Positive for itching and rash.   All other systems reviewed and are negative.      Blood pressure 149/84, pulse 81, temperature 97.3 F (36.3 C), temperature source Temporal, resp. rate 16, height 1.676 m, weight 73.5 kg, SpO2 98 %.    Physical Exam  Vitals signs and nursing note reviewed.   Constitutional:       General: She is not in acute  distress.     Appearance: Normal appearance. She is normal weight. She is not ill-appearing, toxic-appearing or diaphoretic.   HENT:      Head: Normocephalic and atraumatic.      Right Ear: Tympanic membrane, ear canal and external ear normal.      Left Ear: Tympanic membrane, ear canal and external ear normal.   Eyes:      General: Lids are normal.      Extraocular Movements: Extraocular movements intact.      Right eye: Normal extraocular motion and no nystagmus.      Left eye: Normal extraocular motion and no nystagmus.      Conjunctiva/sclera:      Right eye: Right conjunctiva is not injected. No chemosis or exudate.     Left eye: Left conjunctiva is not injected. No chemosis or exudate.     Pupils: Pupils are equal, round, and reactive to light. Pupils are equal.      Right eye: No corneal abrasion or fluorescein uptake. Seidel exam negative.      Left eye: No corneal abrasion or fluorescein uptake. Seidel exam negative.     Slit lamp exam:     Right eye: Anterior chamber quiet.      Left eye: Anterior chamber quiet.   Cardiovascular:      Rate and Rhythm: Normal rate.   Skin:     Comments: Vesicular rash on erythematous base on left buttock and left thigh.  She also has a similar vesicles on the right side of her face near her eye.  No vesicles on her nose.   Neurological:      General: No focal deficit present.      Mental Status: She is alert.            Diagnostic Results:     LAB STUDIES    All lab value have been personally reviewed by me    Results     Procedure Component Value Units Date/Time    CBC and differential [[id number]] Collected: 03/14/19 0333    Specimen: Blood Updated: 03/14/19 0543     WBC 7.9 K/cmm      RBC 4.37 M/cmm      Hemoglobin 14.6 gm/dL      Hematocrit 40.9 %      MCV 98 fL      MCH 34 pg      MCHC 34 gm/dL      RDW 81.1 %      PLT CT 259 K/cmm  MPV 8.8 fL      Neutrophils % 75.0 %      Lymphocytes 18.0 %      Monocytes 4.0 %      Bands 3 %      Neutrophils Absolute 6.2 K/cmm       Lymphocytes Absolute 1.4 K/cmm      Monocytes Absolute 0.3 K/cmm      Eosinophils Absolute 0.0 K/cmm      Basophils Absolute 0.0 K/cmm      RBC Morphology RBC Morphology Reviewed     Target Cells 1+    Narrative:      Manual differential performed    Basic Metabolic Panel [[id number]]  (Abnormal) Collected: 03/14/19 0333    Specimen: Plasma Updated: 03/14/19 0414     Sodium 132 mMol/L      Potassium 4.2 mMol/L      Chloride 102 mMol/L      CO2 22 mMol/L      Calcium 8.2 mg/dL      Glucose 409 mg/dL      Creatinine 8.11 mg/dL      BUN 18 mg/dL      Anion Gap 91.4 mMol/L      BUN / Creatinine Ratio 27.3 Ratio      EGFR 96 mL/min/1.74m2      Osmolality Calculated 268 mOsm/kg     CBC and differential [[id number]]  (Abnormal) Collected: 03/13/19 1602    Specimen: Blood Updated: 03/13/19 1709     WBC 6.3 K/cmm      RBC 4.74 M/cmm      Hemoglobin 15.3 gm/dL      Hematocrit 08.6 %      MCV 98 fL      MCH 32 pg      MCHC 33 gm/dL      RDW 57.8 %      PLT CT 292 K/cmm      MPV 8.6 fL      Neutrophils % 63.0 %      Lymphocytes 18.0 %      Monocytes 6.0 %      Eosinophils % 1.0 %      Basophils % 1.0 %      Bands 11 %      Neutrophils Absolute 4.7 K/cmm      Lymphocytes Absolute 1.1 K/cmm      Monocytes Absolute 0.4 K/cmm      Eosinophils Absolute 0.1 K/cmm      Basophils Absolute 0.1 K/cmm      RBC Morphology Morphology Consistent with Hemogram    Narrative:      Manual differential performed    C Reactive Protein [[id number]]  (Abnormal) Collected: 03/13/19 1602    Specimen: Plasma Updated: 03/13/19 1703     C-Reactive Protein 8.17 mg/dL     Hepatic function panel (LFT) [[id number]]  (Abnormal) Collected: 03/13/19 1602    Specimen: Plasma Updated: 03/13/19 1702     Protein, Total 8.5 gm/dL      Albumin 4.0 gm/dL      Alkaline Phosphatase 75 U/L      ALT 26 U/L      AST (SGOT) 28 U/L      Bilirubin, Total 0.3 mg/dL      Bilirubin Direct 0.1 mg/dL      Albumin/Globulin Ratio 0.89 Ratio      Globulin 4.5 gm/dL     Basic  Metabolic Panel [[id number]]  (Abnormal) Collected: 03/13/19 1602    Specimen: Plasma Updated: 03/13/19 1639  Sodium 133 mMol/L      Potassium 3.6 mMol/L      Chloride 99 mMol/L      CO2 26 mMol/L      Calcium 8.6 mg/dL      Glucose 97 mg/dL      Creatinine 1.61 mg/dL      BUN 22 mg/dL      Anion Gap 09.6 mMol/L      BUN / Creatinine Ratio 27.5 Ratio      EGFR 80 mL/min/1.13m2      Osmolality Calculated 270 mOsm/kg             RADIOLOGIC STUDIES    All images have been personally viewed by me    No results found.        EKG:     EKG:   Last EKG Result     None             PROCEDURES     1.      ORDERS PLACED THIS VISIT     Orders  Orders Placed This Encounter   Procedures    CBC and differential    Basic Metabolic Panel    Hepatic function panel (LFT)    C Reactive Protein    CBC and differential    Basic Metabolic Panel    Diet cardiac    Vital signs    Pain Assessment    Mobility Protocol    RD initiate protocol if applicable    Notify physician    Height    Skin assessment    Oral care    Mechanical VTE: Pneumatic Compression; Knee high    Encourage fluids    Nursing communication    Contact primary attending physician on morning rounds    Education: Tobacco Cessation    Education: Activity    Education: Disease Process & Condition    Education: Pain Management    Education: Falls Risk    Full Code    Inpatient Consult to Infectious Disease Telemedicine    Airborne isolation status    Contact isolation status    Thrive Vanilla Ice Cream Cup Quantity: A. One; Frequency: Daily with dinner Please send only Vanilla.    Saline lock IV    saline lock IV    Bed Request    Admit to Inpatient    ED Admission Request       Medications  Medications   acyclovir (ZOVIRAX) 600 mg in sodium chloride 0.9 % 250 mL IVPB (600 mg Intravenous New Bag 03/14/19 1004)   sodium chloride (PF) 0.9 % injection 3 mL ( Intravenous Canceled Entry 03/14/19 0507)   naloxone [facility]) injection 0.4 mg (has no  administration in time range)   enoxaparin (LOVENOX) syringe 40 mg (40 mg Subcutaneous Given 03/13/19 2250)   acetaminophen (TYLENOL) tablet 650 mg (650 mg Oral Given 03/14/19 1016)     Or   acetaminophen (TYLENOL) 160 MG/5ML oral solution 650 mg ( per NG tube See Alternative 03/14/19 1016)     Or   acetaminophen (TYLENOL) suppository 650 mg ( Rectal See Alternative 03/14/19 1016)   HYDROcodone-acetaminophen (NORCO) 5-325 MG per tablet 1-2 tablet (1 tablet Oral Given 03/13/19 2023)   0.9% NaCl infusion ( Intravenous New Bag 03/14/19 1005)   predniSONE (DELTASONE) tablet 60 mg (60 mg Oral Given 03/14/19 1004)   gabapentin (NEURONTIN) capsule 100 mg (100 mg Oral Given 03/13/19 2250)   ibuprofen (ADVIL) tablet 600 mg (600 mg Oral Given 03/13/19  1614)   potassium chloride (KLOR-CON) CR tablet 40 mEq (40 mEq Oral Given 03/13/19 2251)   predniSONE (DELTASONE) 20 MG tablet (has no administration in time range)   ibuprofen (ADVIL) 600 MG tablet (has no administration in time range)   fluorescein 1 MG ophthalmic strip (has no administration in time range)              Allergies & Medications:     Pt has No Known Allergies.    Current Discharge Medication List      CONTINUE these medications which have NOT CHANGED    Details   aspirin EC 81 MG EC tablet Take 81 mg by mouth daily      Calcium Carbonate-Vitamin D (CALTRATE 600+D PO) Take by mouth      traMADol (ULTRAM) 50 MG tablet Take 50 mg by mouth every 6 (six) hours as needed for Pain      valacyclovir (VALTREX) 1000 MG tablet Take 1,000 mg by mouth 2 (two) times daily                 Past History:     Medical: Pt has a past medical history of Shingles.    Surgical: Pt  has a past surgical history that includes Splenectomy and Replacement total knee (Left).    Family: The family history is not on file.    Social: Pt reports that she has never smoked. She has never used smokeless tobacco. She reports that she does not drink alcohol or use drugs.        ATTESTATIONS         The  results of diagnostic studies have been reviewed by myself. The above past medical, family, social, and surgical histories have been reviewed by myself. The clinical impression and plan have been discussed with the patient and/or the patient's family. All questions have been answered.    Note:  This chart was generated by an EMR and may contain errors, including typographical, or omissions not intended by the user. This chart was generated by the Epic EMR system/speech recognition and may contain inherent errors or omissions not intended by the user. Grammatical errors, random word insertions, deletions, pronoun errors and incomplete sentences are occasional consequences of this technology due to software limitations. Not all errors are caught or corrected. If there are questions or concerns about the content of this note or information contained within the body of this dictation they should be addressed directly with the author for clarification          Consuelo Pandy, [place]  03/14/19 1106       Ermalene Postin, MD  03/14/19 [phone]

## 2019-03-14 ENCOUNTER — Telehealth: Payer: Self-pay

## 2019-03-14 ENCOUNTER — Encounter: Payer: Self-pay | Admitting: Internal Medicine

## 2019-03-14 DIAGNOSIS — B027 Disseminated zoster: Secondary | ICD-10-CM

## 2019-03-14 LAB — CBC AND DIFFERENTIAL
Bands: 3 % (ref 0–10)
Basophils Absolute: 0 10*3/uL (ref 0.0–0.3)
Eosinophils Absolute: 0 10*3/uL (ref 0.0–0.8)
Hematocrit: 42.9 % (ref 36.0–48.0)
Hemoglobin: 14.6 gm/dL (ref 12.0–16.0)
Lymphocytes Absolute: 1.4 10*3/uL (ref 0.6–5.1)
Lymphocytes: 18 % (ref 15.0–46.0)
MCH: 34 pg (ref 28–35)
MCHC: 34 gm/dL (ref 32–36)
MCV: 98 fL (ref 80–100)
MPV: 8.8 fL (ref 6.0–10.0)
Monocytes Absolute: 0.3 10*3/uL (ref 0.1–1.7)
Monocytes: 4 % (ref 3.0–15.0)
Neutrophils %: 75 % (ref 42.0–78.0)
Neutrophils Absolute: 6.2 10*3/uL (ref 1.7–8.6)
PLT CT: 259 10*3/uL (ref 130–440)
RBC: 4.37 10*6/uL (ref 3.80–5.00)
RDW: 11.9 % (ref 11.0–14.0)
WBC: 7.9 10*3/uL (ref 4.0–11.0)

## 2019-03-14 LAB — BASIC METABOLIC PANEL
Anion Gap: 12.2 mMol/L (ref 7.0–18.0)
BUN / Creatinine Ratio: 27.3 Ratio (ref 10.0–30.0)
BUN: 18 mg/dL (ref 7–22)
CO2: 22 mMol/L (ref 20–30)
Calcium: 8.2 mg/dL — ABNORMAL LOW (ref 8.5–10.5)
Chloride: 102 mMol/L (ref 98–110)
Creatinine: 0.66 mg/dL (ref 0.60–1.20)
EGFR: 96 mL/min/{1.73_m2} (ref 60–150)
Glucose: 118 mg/dL — ABNORMAL HIGH (ref 71–99)
Osmolality Calculated: 268 mOsm/kg — ABNORMAL LOW (ref 275–300)
Potassium: 4.2 mMol/L (ref 3.5–5.3)
Sodium: 132 mMol/L — ABNORMAL LOW (ref 136–147)

## 2019-03-14 NOTE — Telephone Encounter (Signed)
-----   Message from Berniece Salines, DO sent at 03/13/2019  7:39 PM EST ----- Normal study. Notify patient and send copy to pcp

## 2019-03-14 NOTE — Telephone Encounter (Signed)
lpmtcb 2/24 stress test results

## 2019-03-14 NOTE — Teleconsult (Signed)
Infectious Disease E-Consult Initial - [facility]  [employer]   Patient Name: Peggy Hernandez, Peggy Hernandez   Attending Physician: Jenness Corner, MD   Primary Care Physician: Patsy Lager, MD     Consultation Information  "This patient recommendation is based on a telemedicine consult request which was completed asynchronously through chart review and information provided by the primary physician. The patient was not seen or examined today. The evaluation is consultative in nature and all patient care and treatment decisions can either be accepted or rejected by the patient's primary hospital-based treating physician using their own independent medical judgement for their patient."    Consultant Contact Information: Please call [employer] [facility] [phone]    Impression and Recommendation    #Disseminated zoster involving multiple dermatomes left lower extremity and facial lesions  #History of splenectomy    87 female with asplenia, Lt TKR  admitted with disseminated varicella-zoster left hip lower extremity abdomen and also small vesicles behind the right ear and some spots on the face including nasal bridge.  Slit-lamp examination no corneal injury in the ED.  Per chart review has history of shingles in the past.  Labs afebrile, WBC 7.9K (3% bands), room air, LFT N,    Discussion: Likely given underlying splenectomy, and age leading to reactivation of zoster.  Splenectomized patients are at increased risk of encapsulated bacterial infections including strep pneumo, haemophilus influenza, Neisseria meningitis and other infections.  There are reported cases of disseminated zoster in asplenic patients.  Known complications include PNH, motor neuropathies, ocular and ear involvement and stroke risk.  Eventually she is a candidate for recombinant varicella-zoster vaccination.      Plan  C/w IV acyclovir 10 mg/kg IV q 8 hours along with good IV hydration  Monitor renal functions closely -while on IV  acyclovir  Please obtain skin lesion swabs for VZV, HSV 1, HSV-2  Continue with Neurontin and prednisone (decrease risk of PNH)  Consider ophthalmology consult new lesions on the face and nose  Continue with airborne and contact precautions -until crusting/healing of the lesions  Please check HIV, A1c  Tele-ID will follow along      History of Presenting Illness  Patient is currently in airborne isolation, history obtained from chart review.  Peggy Hernandez is a 64 y.o. female.  PMH asplenia admitted with disseminated varicella-zoster infection, reported left hip soreness f/b-(Saturday) evaluated by PCP given Flexeril, prednisone Valtrex and tramadol.  Rash spread to the lower legs along with abdominal swelling.  Currently rash is scattered with small vesicles including behind the right ear, multiple spots on the face next to right nasal bridge of the nose.  Slit-lamp exam no injury to cornea reported.  On IV acyclovir.  No systemic signs of infection including fever/chills.  No RS/GI/GU symptoms.       Histories    Past Medical History:  Past Medical History:   Diagnosis Date    Shingles        Past Social History:  Past Surgical History:   Procedure Laterality Date    REPLACEMENT TOTAL KNEE Left     SPLENECTOMY         Family History:  History reviewed. No pertinent family history.    Social History:  Social History     Tobacco Use    Smoking status: Never Smoker    Smokeless tobacco: Never Used   Substance Use Topics    Alcohol use: Never     Frequency: Never  Drug use: Never       Inpatient Medications    Antibiotic Medications:  Antibiotics (From admission, onward)    None          Allergies:  No Known Allergies    Objective   Vitals: T:97.3 F (36.3 C) (Temporal),  BP:149/84, HR:81, RR:16, SaO2:98%, FiO2-O2 (L/m): , Dosing Wt:  Wt Readings from Last 1 Encounters:   03/13/19 73.5 kg (162 lb)   , ZOX:WRUE mass index is 26.15 kg/m.      Patient was not seen or examined.       Results  Review    Labs:  Estimated Creatinine Clearance: 91.9 mL/min (based on SCr of 0.66 mg/dL).  Recent Labs   Lab 03/14/19  0333 03/13/19  1602   WBC 7.9 6.3   RBC 4.37 4.74   Hemoglobin 14.6 15.3   Hematocrit 42.9 46.2   MCV 98 98   PLT CT 259 292             No results found for: HGBA1CPERCNT  Recent Labs   Lab 03/14/19  0333 03/13/19  1602   Glucose 118* 97   Sodium 132* 133*   Potassium 4.2 3.6   Chloride 102 99   CO2 22 26   BUN 18 22   Creatinine 0.66 0.80   EGFR 96 80   Calcium 8.2* 8.6     Recent Labs   Lab 03/13/19  1602   Albumin 4.0   Protein, Total 8.5*   Bilirubin, Total 0.3   Alkaline Phosphatase 75   ALT 26   AST (SGOT) 28           Invalid input(s):  AMORPHOUSUA    No results found.     Microbiology Results     None            Veronda Prude, MD  03/14/19 12:27 PM  MRN: [id number]                                     CSN: [id number]
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
## 2019-03-14 NOTE — Progress Notes (Signed)
Readmission Risk  [facility] [address]   Patient Name: Peggy Hernandez   Attending Physician: Jenness Corner, MD   Today's date:   03/14/2019 LOS: 1 days   Expected Discharge Date      Readmission Assessment:                                                              Discharge Planning  ReAdmit Risk Score: 8  Does the patient have perscription coverage?: Yes  Confirmed PCP with Pt: Yes  Confirmed PCP name: Eglinger  Confirm Transport to F/U Appt.: Self/Private Vehicle/Friend  Social Work Referral: Not Applicable  CM Comments: 2/24 RNCM (AMP): Pt adm w/ disseminated varicella-zoster infection. Pain control. CM spoke w/ pt via phone. PTA: Pt lives alone, is independent, selfcare, and drives. Pt anticipates d/c home to boyfriend's house w/ his support/transport when medically ready. RNCM following.       IDPA:      Healthcare Decisions  Interviewed:: Patient  Orientation/Decision Making Abilities of Patient: Alert and Oriented x3, able to make decisions  Advance Directive: Patient has advance directive, copy not in chart  Healthcare Agent Appointed: Yes(Angela and Ronnald Nian)  Prior to admission  Prior level of function: Independent with ADLs, Ambulates independently  Type of Residence: Private residence  Home Layout: Two level, Able to live on main level with bedroom/bathroom, Other (Comment)(14 steps w/ rail.)  Have running water, electricity, heat, etc?: Yes  Living Arrangements: Alone  How do you get to your MD appointments?: self  How do you get your groceries?: self  Who fixes your meals?: self  Who does your laundry?: self  Who picks up your prescriptions?: self  Dressing: Independent  Grooming: Independent  Feeding: Independent  Bathing: Independent  Toileting: Independent  DME Currently at Home: [employer], [employer] Humph[employer], [employer], [employer], [employer]  Discharge Planning  Support Systems: Children, Spouse/significant other  Patient expects to be discharged to:: home  Anticipated [place] plan  discussed with:: Same as interviewed  Mode of transportation:: Private car (family member)  Does the patient have perscription coverage?: Yes  Consults/Providers  PT Evaluation Needed: No  OT Evalulation Needed: No  SLP Evaluation Needed: No  Correct PCP listed in Epic?: No (comment)  Family and PCP  PCP on file was verified as the current PCP?: Provider not on file  PCP - first name: Harvin Hazel  PCP - last name: Eglinger  PCP - city: [place]  PCP - state: [place]  PCP - phone : [phone]   30 Day Readmission:       Provider Notifications:        Philmore Pali BSN, RN  Nurse Case Data processing manager  [phone]

## 2019-03-14 NOTE — Progress Notes (Signed)
Nutrition Therapy  Nutrition Assessment  MST=3--Unsure wt loss, decreased appetite.   Patient Information:     Name:Peggy Hernandez   Age: 64 y.o.   Sex: female     MRN: [id number]    Recommendation:     1. Cardiac diet  2. Vanilla Thrive ice cream with dinner.    Nutrition History:     64 y.o. female patient past medical history of asplenia who presented to the emergency department for suspected disseminated varicella-zoster infection.  PMH positive for shingles     Limited wt hx available per EMR.    Pt in droplet and contact isolation--negative pressure room. RD spoke with pt via phone.     Pt states her appetite was at baseline until last Friday when she started feeling poorly. States as the pain increased her appetite decreased. Denies any wt loss at this time. Receptive to [employer] ice cream daily with dinner. RN to assist pt with menu choices. Menu provided outside pt's room--CNA aware and will take menu in to pt during next care.     Nutrition Focus Physical Findings: Unable to perform    Nutrition Risk Level: Moderate    Nutrition Diagnosis:      Predicted Suboptimal Energy Intake related to acute illness aeb decreased appetite for ~ 5 days pta as reported by pt.     Monitoring:  Evaluation:    PO/EN/PN intake:  Amount of food    Labs:  Electrolyte Profile, Renal Profile and Glucose, casual    GI Profile:  Bowel Function   Nutrition Focused Physical:  Digestive system and Skin       Assessment Data:     Admission Dx:  Disseminated herpes zoster [B02.7]  Asplenia [Q89.01]  Disseminated varicella [B01.9]  PMH:  has a past medical history of Shingles.  PSH:  has a past surgical history that includes Splenectomy and Replacement total knee (Left).     Height: 1.676 m (5\' 6" )   Weight: 73.5 kg (162 lb)   BMI: Body mass index is 26.15 kg/m.   IBW: 59 kg    Pertinent Meds: Reviewed--includes prednisone, kcl repletion, and others.   Pertinent Labs:  Recent Labs   Lab 03/14/19  0333 03/13/19  1602   Sodium  132* 133*   Potassium 4.2 3.6   Chloride 102 99   CO2 22 26   BUN 18 22   Creatinine 0.66 0.80   Glucose 118* 97   Calcium 8.2* 8.6   Osmolality Calculated 268* 270*          Diet Order:  Orders Placed This Encounter   Procedures   . Diet cardiac   . Thrive Vanilla Ice Cream Cup Quantity: A. One; Frequency: Daily with dinner Please send only Vanilla.        GI symptoms:  + BM 03/14/2019--diarrhea reported.     Hydration:   . sodium chloride 100 mL/hr at 03/14/19 1005     I/O:  250/600  Skin:  No PI reported      Food Security Issues:  No      Learning Needs:  No education needs at this time    Estimated Needs:  Estimated Energy Needs  Total Energy Estimated Needs: 1475-1770 kcal  Method for Estimating Needs: 25-30 kcal/kg (59 kg)  Estimated Protein Needs  Total Protein Estimated Needs: 59-77 gm  Method for Estimating Needs: 1-1.3 gm/kg (59 kg)       Additional Comments:  Shearon Stalls, RD [phone]  03/14/2019 11:14 AM

## 2019-03-14 NOTE — UM Notes (Addendum)
[facility]  Inpatient authorization request, ID# [id number]          [facility] Utilization Management Review Sheet    Facility: [facility]    Name: Peggy Hernandez    DOB: 03/25/1955     MRN: [id number]     CSN: [id number]     Time and date of admission: 03/13/2019  2:31 PM    Patient Class: Inpatient    Payor: Payor: OUT OF STATE BLUE CROSS / Plan: BCBS OUT OF STATE PPO / Product Type: BCBS /     ID# [id number]    Auth# pending     Diagnosis:     ICD-10-CM    1. Disseminated herpes zoster  B02.7    2. Asplenia  Q89.01         Date of service reviewed: 03/13/2019     MCG criteria set- M-280    HPI: "CC: Generalized rash  Peggy Hernandez is a 64 y.o. female patient past medical history of asplenia who presented to the emergency department for suspected disseminated varicella-zoster infection.  Patient reported initially started left hip with soreness.  It was thought maybe patient might of pulled a muscle.  Rash developed Saturday.  Primary care physician called in tramadol as well as Valtrex.  Rash started to spread to the lower legs as well as abdomen and back.  PCP referred her to the emergency department for further evaluation.  Patient reported taking Valtrex for only 2 days.  Due to the extent of her rash with scattered small vesicles including behind right ear, multiple spots on face especially next to right nares and bridge of the nose, ER physician and PA performed a slit lamp examination.  No obvious injury to the cornea reported.  Acyclovir was initiated.  Contact and droplet precautions initiated.  Patient interviewed by this physician at bedside.  Patient denies any known exposure, fever, chest pain, palpitations, shortness of breath, abdominal pain, nausea, vomiting, dysuria or generalized weakness.  Patient unaware if she has had chickenpox as a child." per H&P      ED Vitals & Treatment:  03/13/19 1427 -- 98.8 F (37.1 C) Temporal 104 100 % -- 18 137/94     LABS:  cbc unremarkable, sodium 133, K 3.6, CRP 8.17     MEDS: acyclovir 600mg  IV x1, ibuprofen 600mg  PO x1      ASSESSMENT & PLAN    Medicine admit note:  Assessment and Plan:                                                              Disseminated varicella-zoster infection  Acute neuritis/neuropathic pain reported  Hyponatremia  Hypokalemia  Asplenia history  -Patient reported initially started 5 days ago soreness to her left hip.  It was initially thought maybe she had pulled a muscle and her PCP started her on Flexeril and prednisone.  -Rash developed around the site on Saturday.  PCP called in tramadol as well as Valtrex.  -Her rash continues to spread including lower leg as well as abdomen and back.  Patient called her PCP who directed her to the emergency department for further evaluation  -Due to red spots side of right nares and bridge of the nose, slit-lamp examination of the eye  done by ER physician and PA.  No documented injury to the cornea reported.  -Acyclovir IV initiated and IV fluid to avoid crystallization in kidney initiated  -CRP 8.17.  BUN 22, creatinine 0.80, GFR 80, serum sodium 133, potassium 3.6  -Potassium repleted.  IV fluid given with repeat BMP in a.m.  -As needed pain medications available  -Tele-ID consult placed  -Contact and droplet precaution initiated  -Patient was given Norco which aggravated the pain.  Patient described it as being on fire and rated the pain especially to the leg 7-8/10 on pain scale.  -Prednisone 60 mg initiated as well as gabapentin 100 mg p.o. nightly for now    DVT PPx: Lovenox   Dispo: Inpatient    Vitals: T:98.8 F (37.1 C) (Temporal),  BP:148/86, HR:(!) 104, RR:18, SaO2:97%    1) General Appearance: Alert and oriented x 4. In no acute distress.   2) Eyes: Pink conjunctiva, anicteric sclera. Pupils are equally reactive to light.  3) ENT: Oral mucosa moist with no pharyngeal congestion, erythema or swelling.  No vesicles to tip of the nose, eyelids or inside  the ear upon examination  4) Neck: Supple, with full range of motion. Trachea is central, no JVD noted  5) Chest: Clear to auscultation bilaterally, no wheezes or rhonchi.  6) CVS: normal rate and regular rhythm, with no murmurs.  7) Abdomen: Soft, non-tender, no palpable mass. Bowel sounds normal. No CVA tenderness  8) Extremities: No pitting edema, pulses palpable, no calf swelling and no gross deformity.  9) Skin: Scattered areas of the regular sized raised redness with vesicles along the face especially around the neck and posterior right ear, across chest, scattered areas of the back, and worsening spots medial aspect of the left thigh.    10) Lymphatics: No lymphadenopathy in axillary, cervical and inguinal area.   11) Neurological: Cranial nerves II-XII intact. No gross focal motor or sensory deficits noted.  12) Psychiatric: Affect is appropriate. No hallucinations.       Admit Inpatient: infectious disease consult pending, airborne and contact isolation precautions initiated, vital signs q8h, pain management, falls precautions, SCDs.      Scheduled Meds:  Current Facility-Administered Medications   Medication Dose Route Frequency    acyclovir  10 mg/kg (Ideal) Intravenous Q8H    enoxaparin  40 mg Subcutaneous Q24H    gabapentin  100 mg Oral QHS    predniSONE  60 mg Oral QAM W/BREAKFAST     Continuous Infusions:   sodium chloride 100 mL/hr at 03/14/19 1005       Infectious disease consult 2/24:  Impression and Recommendation  #Disseminated zoster involving multiple dermatomes left lower extremity and facial lesions  #History of splenectomy    36 female with asplenia, Lt TKR  admitted with disseminated varicella-zoster left hip lower extremity abdomen and also small vesicles behind the right ear and some spots on the face including nasal bridge.  Slit-lamp examination no corneal injury in the ED.  Per chart review has history of shingles in the past.  Labs afebrile, WBC 7.9K (3% bands), room air, LFT  N,    Discussion: Likely given underlying splenectomy, and age leading to reactivation of zoster.  Splenectomized patients are at increased risk of encapsulated bacterial infections including strep pneumo, haemophilus influenza, Neisseria meningitis and other infections.  There are reported cases of disseminated zoster in asplenic patients.  Known complications include PNH, motor neuropathies, ocular and ear involvement and stroke risk.  Eventually she is a  candidate for recombinant varicella-zoster vaccination.      Plan  C/w IV acyclovir 10 mg/kg IV q 8 hours along with good IV hydration  Monitor renal functions closely -while on IV acyclovir  Please obtain skin lesion swabs for VZV, HSV 1, HSV-2  Continue with Neurontin and prednisone (decrease risk of PNH)  Consider ophthalmology consult new lesions on the face and nose  Continue with airborne and contact precautions -until crusting/healing of the lesions  Please check HIV, A1c  Tele-ID will follow along      Vitals 2/24: BP 149/84    Pulse 81    Temp 97.3 F (36.3 C) (Temporal)    Resp 16    Ht 1.676 m (5\' 6" )    Wt 73.5 kg (162 lb)    SpO2 98%    BMI 26.15 kg/m      Recent Labs   Lab 03/14/19  0333   WBC 7.9   Hemoglobin 14.6   Hematocrit 42.9   PLT CT 259      Recent Labs   Lab 03/14/19  0333   Sodium 132*   Potassium 4.2   Chloride 102   CO2 22   BUN 18   Creatinine 0.66   EGFR 96   Glucose 118*   Calcium 8.2*        Adele Dan, RN BSN  Utilization Review Nurse  Utilization Management  [facility]  [address]  [place], [place] [address]  Phone: [phone], direct/confidential  Fax: [phone]  [email or web address]

## 2019-03-15 LAB — BASIC METABOLIC PANEL
Anion Gap: 10.9 mMol/L (ref 7.0–18.0)
BUN / Creatinine Ratio: 20.3 Ratio (ref 10.0–30.0)
BUN: 13 mg/dL (ref 7–22)
CO2: 22 mMol/L (ref 20–30)
Calcium: 8 mg/dL — ABNORMAL LOW (ref 8.5–10.5)
Chloride: 107 mMol/L (ref 98–110)
Creatinine: 0.64 mg/dL (ref 0.60–1.20)
EGFR: 97 mL/min/{1.73_m2} (ref 60–150)
Glucose: 100 mg/dL — ABNORMAL HIGH (ref 71–99)
Osmolality Calculated: 272 mOsm/kg — ABNORMAL LOW (ref 275–300)
Potassium: 3.9 mMol/L (ref 3.5–5.3)
Sodium: 136 mMol/L (ref 136–147)

## 2019-03-15 LAB — CBC AND DIFFERENTIAL
Bands: 3 % (ref 0–10)
Basophils Absolute: 0 10*3/uL (ref 0.0–0.3)
Eosinophils Absolute: 0 10*3/uL (ref 0.0–0.8)
Hematocrit: 38.3 % (ref 36.0–48.0)
Hemoglobin: 12.6 gm/dL (ref 12.0–16.0)
Lymphocytes Absolute: 5 10*3/uL (ref 0.6–5.1)
Lymphocytes: 37 % (ref 15.0–46.0)
MCH: 32 pg (ref 28–35)
MCHC: 33 gm/dL (ref 32–36)
MCV: 98 fL (ref 80–100)
MPV: 8.6 fL (ref 6.0–10.0)
Monocytes Absolute: 3 10*3/uL — ABNORMAL HIGH (ref 0.1–1.7)
Monocytes: 22 % — ABNORMAL HIGH (ref 3.0–15.0)
Neutrophils %: 38 % — ABNORMAL LOW (ref 42.0–78.0)
Neutrophils Absolute: 5.5 10*3/uL (ref 1.7–8.6)
PLT CT: 258 10*3/uL (ref 130–440)
RBC: 3.93 10*6/uL (ref 3.80–5.00)
RDW: 11.9 % (ref 11.0–14.0)
WBC: 13.5 10*3/uL — ABNORMAL HIGH (ref 4.0–11.0)
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
## 2019-03-15 NOTE — Consults (Signed)
Ophthalmology Consult Note      Date Time: 03/15/19 10:04 AM  Patient Name: Peggy Hernandez  Requesting Physician: Jenness Corner, MD      Reason for Consultation:   Disseminated varicella zoster with involvement of eyelid    Assessment/Plan:   1) Disseminated varicella zoster   - with mild involvement of left lower lid  - no ocular surface or intraocular involvement.   - erythromycin ointment to lid lesion if any irritation develops QID/PRN  - acyclovir as per primary team/ID  - follow up with opthalmology or her primary eyecare provider PRN    History:   ADELIS DOCTER is a 64 y.o. female with no significant POH, PMH significant for asplenia, who presents to the hospital on 03/13/2019 with disseminated VZV. Rash started a week ago, has been on Valtrex and now IV acyclovir, she reports she is improving but this morning noted a lesion on her left lower lid. No tenderness to touch, no discharge, no vision changes, no flashes, no floaters, no eye redness, no light sensitivty.     Chief Complaint   Patient presents with    Herpes Zoster       Past Medical History:     Past Medical History:   Diagnosis Date    Shingles        Past Surgical History:     Past Surgical History:   Procedure Laterality Date    REPLACEMENT TOTAL KNEE Left     SPLENECTOMY         Family History:   History reviewed. No pertinent family history.    Social History:     Social History     Socioeconomic History    Marital status: Divorced     Spouse name: Not on file    Number of children: Not on file    Years of education: Not on file    Highest education level: Not on file   Occupational History    Not on file   Social Needs    Financial resource strain: Not on file    Food insecurity     Worry: Not on file     Inability: Not on file    Transportation needs     Medical: Not on file     Non-medical: Not on file   Tobacco Use    Smoking status: Never Smoker    Smokeless tobacco: Never Used   Substance and Sexual Activity     Alcohol use: Never     Frequency: Never    Drug use: Never    Sexual activity: Not on file   Lifestyle    Physical activity     Days per week: Not on file     Minutes per session: Not on file    Stress: Not on file   Relationships    Social connections     Talks on phone: Not on file     Gets together: Not on file     Attends religious service: Not on file     Active member of club or organization: Not on file     Attends meetings of clubs or organizations: Not on file     Relationship status: Not on file    Intimate partner violence     Fear of current or ex partner: Not on file     Emotionally abused: Not on file     Physically abused: Not on file     Forced sexual activity:  Not on file   Other Topics Concern    Not on file   Social History Narrative    Not on file       Allergies:   No Known Allergies    Medications:     Current Facility-Administered Medications   Medication Dose Route Frequency    acyclovir  10 mg/kg (Ideal) Intravenous Q8H    enoxaparin  40 mg Subcutaneous Q24H    gabapentin  100 mg Oral QHS    predniSONE  60 mg Oral QAM W/BREAKFAST    sodium chloride (PF)  3 mL Intravenous Q8H       Review of Systems:   A 12 point comprehensive review of systems was Negative except painful rash       Eye Exam      Visual Acuity: OD 20/25 OS 20/30   IOP (palp): OD WNL OS WNL   EOM: OD full OS full   Pupil: OD WNL no APD OS WNL no APD   CVF: OD full  OS full         Pupils dilated with 2.5% phenylephrine and 1% cyclopentolate at 10:00     Pen Light Exam OD OS   Lids/Adnexa Normal Abnormal- lower lid margin single vesicular lesion, no dischagre, no cellulitis   Conjunctiva Sclera Normal Normal   Cornea Normal Normal   Anterior Chamber Normal Normal   Iris Normal Normal   Lens/IOL Normal Normal   Posterior Chamber Normal Normal       Funduscopic Exam OD OS   Vitreous Normal Normal   Disc Normal Normal   Vessels Normal Normal   Macula Normal Normal   Periphery Normal Normal   C/D Ratio 0.4 0.4        Labs:     Results     Procedure Component Value Units Date/Time    CBC and differential [[id number]]  (Abnormal) Collected: 03/15/19 0432    Specimen: Blood Updated: 03/15/19 0552     WBC 13.5 K/cmm      RBC 3.93 M/cmm      Hemoglobin 12.6 gm/dL      Hematocrit 40.9 %      MCV 98 fL      MCH 32 pg      MCHC 33 gm/dL      RDW 81.1 %      PLT CT 258 K/cmm      MPV 8.6 fL      Neutrophils % 38.0 %      Lymphocytes 37.0 %      Monocytes 22.0 %      Bands 3 %      Neutrophils Absolute 5.5 K/cmm      Lymphocytes Absolute 5.0 K/cmm      Monocytes Absolute 3.0 K/cmm      Eosinophils Absolute 0.0 K/cmm      Basophils Absolute 0.0 K/cmm      RBC Morphology RBC Morphology ReviewedMorphology Consistent with Hemogram    Narrative:      Manual differential performed    Basic Metabolic Panel [[id number]]  (Abnormal) Collected: 03/15/19 0432    Specimen: Plasma Updated: 03/15/19 0504     Sodium 136 mMol/L      Potassium 3.9 mMol/L      Chloride 107 mMol/L      CO2 22 mMol/L      Calcium 8.0 mg/dL      Glucose 213 mg/dL      Creatinine 0.86 mg/dL  BUN 13 mg/dL      Anion Gap 16.1 mMol/L      BUN / Creatinine Ratio 20.3 Ratio      EGFR 97 mL/min/1.40m2      Osmolality Calculated 272 mOsm/kg           Signed by: Kristine Garbe, MD

## 2019-03-15 NOTE — Progress Notes (Signed)
NURSE NOTE SUMMARY  [facility] [address]   Patient Name: Peggy Hernandez   Attending Physician: Jenness Corner, MD   Today's date:   03/15/2019 LOS: 2 days   Shift Summary:                                                              1615: Patient A/O x4 throughout shift. Up independently in room. IV leaking and new IV placed by VAT. Shingles to left thigh area per patient are not as painful today. Patient c/o having shingles to left eyelid, eye is red. Patient has been voiding and had BM today. Boyfriend visiting at bedside today.      Provider Notifications:        Rapid Response Notifications:  Mobility:      PMP Activity: Step 6 - Walks in Room (03/15/2019 10:00 AM)     Weight tracking:  Family Dynamic:   Last 3 Weights for the past 72 hrs (Last 3 readings):   Weight   03/13/19 1427 73.5 kg (162 lb)             Last Bowel Movement   Last BM Date: 03/14/19
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
## 2019-03-16 LAB — CBC AND DIFFERENTIAL
Basophils %: 1 % (ref 0.0–3.0)
Basophils Absolute: 0.2 10*3/uL (ref 0.0–0.3)
Eosinophils Absolute: 0 10*3/uL (ref 0.0–0.8)
Hematocrit: 41.8 % (ref 36.0–48.0)
Hemoglobin: 14 gm/dL (ref 12.0–16.0)
Lymphocytes Absolute: 8 10*3/uL — ABNORMAL HIGH (ref 0.6–5.1)
Lymphocytes: 49 % — ABNORMAL HIGH (ref 15.0–46.0)
MCH: 33 pg (ref 28–35)
MCHC: 34 gm/dL (ref 32–36)
MCV: 98 fL (ref 80–100)
MPV: 8.6 fL (ref 6.0–10.0)
Monocytes Absolute: 2.3 10*3/uL — ABNORMAL HIGH (ref 0.1–1.7)
Monocytes: 14 % (ref 3.0–15.0)
Neutrophils %: 36 % — ABNORMAL LOW (ref 42.0–78.0)
Neutrophils Absolute: 5.9 10*3/uL (ref 1.7–8.6)
PLT CT: 306 10*3/uL (ref 130–440)
RBC: 4.25 10*6/uL (ref 3.80–5.00)
RDW: 12.2 % (ref 11.0–14.0)
WBC: 16.3 10*3/uL — ABNORMAL HIGH (ref 4.0–11.0)

## 2019-03-16 LAB — HIV AG/AB 4TH GENERATION: HIV Ag/Ab, 4th Generation: NONREACTIVE

## 2019-03-16 LAB — BASIC METABOLIC PANEL
Anion Gap: 13.4 mMol/L (ref 7.0–18.0)
BUN / Creatinine Ratio: 16.4 Ratio (ref 10.0–30.0)
BUN: 12 mg/dL (ref 7–22)
CO2: 24 mMol/L (ref 20–30)
Calcium: 8.8 mg/dL (ref 8.5–10.5)
Chloride: 102 mMol/L (ref 98–110)
Creatinine: 0.73 mg/dL (ref 0.60–1.20)
EGFR: 89 mL/min/{1.73_m2} (ref 60–150)
Glucose: 172 mg/dL — ABNORMAL HIGH (ref 71–99)
Osmolality Calculated: 276 mOsm/kg (ref 275–300)
Potassium: 3.4 mMol/L — ABNORMAL LOW (ref 3.5–5.3)
Sodium: 136 mMol/L (ref 136–147)

## 2019-03-16 LAB — COMPREHENSIVE METABOLIC PANEL
ALT: 24 U/L (ref 0–55)
AST (SGOT): 16 U/L (ref 10–42)
Albumin/Globulin Ratio: 0.86 Ratio (ref 0.80–2.00)
Albumin: 3.6 gm/dL (ref 3.5–5.0)
Alkaline Phosphatase: 74 U/L (ref 40–145)
Anion Gap: 12.4 mMol/L (ref 7.0–18.0)
BUN / Creatinine Ratio: 16 Ratio (ref 10.0–30.0)
BUN: 12 mg/dL (ref 7–22)
Bilirubin, Total: 0.3 mg/dL (ref 0.1–1.2)
CO2: 24 mMol/L (ref 20–30)
Calcium: 8.8 mg/dL (ref 8.5–10.5)
Chloride: 102 mMol/L (ref 98–110)
Creatinine: 0.75 mg/dL (ref 0.60–1.20)
EGFR: 86 mL/min/{1.73_m2} (ref 60–150)
Globulin: 4.2 gm/dL — ABNORMAL HIGH (ref 2.0–4.0)
Glucose: 172 mg/dL — ABNORMAL HIGH (ref 71–99)
Osmolality Calculated: 274 mOsm/kg — ABNORMAL LOW (ref 275–300)
Potassium: 3.4 mMol/L — ABNORMAL LOW (ref 3.5–5.3)
Protein, Total: 7.8 gm/dL (ref 6.0–8.3)
Sodium: 135 mMol/L — ABNORMAL LOW (ref 136–147)

## 2019-03-16 LAB — HEMOGLOBIN A1C: Hgb A1C, %: 5.6 %
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
## 2019-03-16 NOTE — Progress Notes (Signed)
Quick Doc  [facility] [address]   Patient Name: Peggy Hernandez   Attending Physician: Jenness Corner, MD   Today's date:   03/16/2019 LOS: 3 days   Expected Discharge Date      Quick  Assessment:                                                              ReAdmit Risk Score: 7    CM Comments: 2/26 RNCM (AMP): Pt adm w/ disseminated varicella-zoster infection. No CM needs at this time. Pt anticipates d/c to boyfriends house w/ his support/transport when medically ready. RNCM following.                                                                                       Provider Notifications:        Philmore Pali BSN, RN  Nurse Case Data processing manager  [phone]
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
## 2019-03-17 MED ORDER — VH POTASSIUM CHLORIDE CRYS ER 20 MEQ PO TBCR (WRAP)
20.00 meq | EXTENDED_RELEASE_TABLET | Freq: Once | ORAL | Status: AC
Start: 2019-03-17 — End: 2019-03-17
  Administered 2019-03-17: 11:00:00 20 meq via ORAL
  Filled 2019-03-17: qty 1

## 2019-03-17 MED ORDER — PREDNISONE 20 MG PO TABS
50.0000 mg | ORAL_TABLET | Freq: Every morning | ORAL | Status: DC
Start: 2019-03-18 — End: 2019-03-18
  Administered 2019-03-18: 09:00:00 50 mg via ORAL
  Filled 2019-03-17: qty 1

## 2019-03-17 NOTE — Nursing Progress Note (Signed)
NURSE NOTE SUMMARY  [facility] [address]   Patient Name: Peggy Hernandez   Attending Physician: Jenness Corner, MD   Today's date:   03/17/2019 LOS: 4 days   Shift Summary:                                                              Pt A&Ox 4, VSS. Up independently in the room. IV on continuous fluids. Shingles to left thigh and back, not painful. Call bell within reach.      Provider Notifications:        Rapid Response Notifications:  Mobility:      PMP Activity: Step 6 - Walks in Room (03/16/2019  9:00 AM)     Weight tracking:  Family Dynamic:   No data found.          Last Bowel Movement   Last BM Date: 03/15/19
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
## 2019-03-17 NOTE — Progress Notes (Signed)
NURSE NOTE SUMMARY  [facility] [address]   Patient Name: [name]   Attending Physician: Jenness Corner, MD   Today's date:   03/17/2019 LOS: 4 days   Shift Summary:                                                              Pt is A&Ox4. Takes meds whole with water. Up independently in the room. NS infusing at 100 mls/hr. Shingles to pts left thigh and left side of her back; not complaining of any pain. Pts husband came to visit this afternoon. Pt got in the shower this shift. Call bell is within reach; will continue to monitor.      Provider Notifications:        Rapid Response Notifications:  Mobility:      PMP Activity: Step 6 - Walks in Room (03/17/2019  8:17 AM)     Weight tracking:  Family Dynamic:   No data found.          Last Bowel Movement   Last BM Date: 03/15/19

## 2019-03-18 LAB — BASIC METABOLIC PANEL
Anion Gap: 9.9 mMol/L (ref 7.0–18.0)
BUN / Creatinine Ratio: 24.2 Ratio (ref 10.0–30.0)
BUN: 15 mg/dL (ref 7–22)
CO2: 24 mMol/L (ref 20–30)
Calcium: 8.8 mg/dL (ref 8.5–10.5)
Chloride: 105 mMol/L (ref 98–110)
Creatinine: 0.62 mg/dL (ref 0.60–1.20)
EGFR: 98 mL/min/{1.73_m2} (ref 60–150)
Glucose: 102 mg/dL — ABNORMAL HIGH (ref 71–99)
Osmolality Calculated: 271 mOsm/kg — ABNORMAL LOW (ref 275–300)
Potassium: 3.9 mMol/L (ref 3.5–5.3)
Sodium: 135 mMol/L — ABNORMAL LOW (ref 136–147)

## 2019-03-18 MED ORDER — VALACYCLOVIR HCL 1 G PO TABS
1000.0000 mg | ORAL_TABLET | Freq: Three times a day (TID) | ORAL | 0 refills | Status: DC
Start: 2019-03-18 — End: 2019-03-18

## 2019-03-18 MED ORDER — PREDNISONE 10 MG PO TABS
ORAL_TABLET | ORAL | 0 refills | Status: DC
Start: 2019-03-18 — End: 2019-11-10

## 2019-03-18 MED ORDER — FAMOTIDINE 20 MG PO TABS
20.0000 mg | ORAL_TABLET | Freq: Every day | ORAL | 0 refills | Status: DC
Start: 2019-03-18 — End: 2019-11-10

## 2019-03-18 MED ORDER — VALACYCLOVIR HCL 1 G PO TABS
1000.00 mg | ORAL_TABLET | Freq: Three times a day (TID) | ORAL | 0 refills | Status: AC
Start: 2019-03-18 — End: 2019-03-28

## 2019-03-18 MED ORDER — PREDNISONE 10 MG PO TABS
ORAL_TABLET | ORAL | 0 refills | Status: DC
Start: 2019-03-18 — End: 2019-03-18

## 2019-03-18 MED ORDER — HYDROCODONE-ACETAMINOPHEN 5-325 MG PO TABS
1.0000 | ORAL_TABLET | Freq: Every day | ORAL | 0 refills | Status: DC | PRN
Start: 2019-03-18 — End: 2019-03-18

## 2019-03-18 MED ORDER — FAMOTIDINE 20 MG PO TABS
20.0000 mg | ORAL_TABLET | Freq: Every day | ORAL | 0 refills | Status: DC
Start: 2019-03-18 — End: 2019-03-18

## 2019-03-18 MED ORDER — HYDROCODONE-ACETAMINOPHEN 5-325 MG PO TABS
1.00 | ORAL_TABLET | Freq: Every day | ORAL | 0 refills | Status: AC | PRN
Start: 2019-03-18 — End: 2019-03-25
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
## 2019-03-18 NOTE — Plan of Care (Addendum)
NURSE NOTE SUMMARY  [facility] - [facility] [address]   Patient Name: Peggy Hernandez   Attending Physician: Jenness Corner, MD   Today's date:   03/18/2019 LOS: 5 days   Shift Summary:                                                              Uneventful shift - AAOx4, denies any pain. Independent in the room. Takes pills whole with water. Continuous fluids running. Callbell in reach.   Provider Notifications:        Rapid Response Notifications:  Mobility:      PMP Activity: Step 6 - Walks in Room (03/18/2019  4:00 AM)     Weight tracking:  Family Dynamic:   No data found.          Last Bowel Movement   Last BM Date: 03/16/19

## 2019-03-18 NOTE — Progress Notes (Addendum)
NURSE NOTE SUMMARY  [facility] [address]   Patient Name: Peggy Hernandez   Attending Physician: Jenness Corner, MD   Today's date:   03/18/2019 LOS: 5 days   Shift Summary:                                                              Pt is A&Ox4. Takes meds whole with water. Independent in the room; no bed alarm. NS infusing at 100 mls/hr. Shingles to pts left thigh and left side of her back; not complaining of any pain and starting to crust over a little. Call bell is within reach. Will continue to monitor.      Provider Notifications:        Rapid Response Notifications:  Mobility:      PMP Activity: Step 6 - Walks in Room (03/18/2019  7:53 AM)     Weight tracking:  Family Dynamic:   No data found.          Last Bowel Movement   Last BM Date: 03/16/19

## 2019-03-19 LAB — VH VARICELLA ZOSTER (VZV) VIRUS, PCR: Varicella-Zoster Virus PCR: POSITIVE — AB

## 2019-03-19 LAB — VH FLUID/TISSUE HERPES SIMPLEX VIRUS 1 AND 2 QUAL PCR - HLAB
HSV 1 PCR: NEGATIVE
HSV 2 PCR: NEGATIVE

## 2019-05-07 ENCOUNTER — Ambulatory Visit: Payer: Medicaid Other | Admitting: Cardiology

## 2019-05-09 ENCOUNTER — Encounter: Payer: Self-pay | Admitting: Cardiology

## 2019-05-09 ENCOUNTER — Other Ambulatory Visit: Payer: Self-pay

## 2019-05-09 ENCOUNTER — Ambulatory Visit (INDEPENDENT_AMBULATORY_CARE_PROVIDER_SITE_OTHER): Payer: Medicaid Other | Admitting: Cardiology

## 2019-05-09 VITALS — BP 122/84 | HR 59 | Ht 62.0 in | Wt 227.0 lb

## 2019-05-09 DIAGNOSIS — N184 Chronic kidney disease, stage 4 (severe): Secondary | ICD-10-CM

## 2019-05-09 DIAGNOSIS — I10 Essential (primary) hypertension: Secondary | ICD-10-CM | POA: Diagnosis not present

## 2019-05-09 DIAGNOSIS — I5032 Chronic diastolic (congestive) heart failure: Secondary | ICD-10-CM | POA: Diagnosis not present

## 2019-05-09 DIAGNOSIS — E782 Mixed hyperlipidemia: Secondary | ICD-10-CM

## 2019-05-09 HISTORY — DX: Morbid (severe) obesity due to excess calories: E66.01

## 2019-05-09 NOTE — Progress Notes (Signed)
Cardiology Office Note:    Date:  05/09/2019   ID:  MINTA FAIR, DOB 11/23/1955, MRN [id number]  PCP:  Martinique, [name], MD  Cardiologist:  Berniece Salines, DO  Electrophysiologist:  None   Referring MD: Martinique, [name], MD   Chief Complaint  Patient presents with  . Follow-up    History of Present Illness:    Kelsey Leonard is a 64 y.o. female with a hx of Heart Failure, Chronic Kidney Disease, DVT (on Eliquis), Hypertension, Hypothyroidism,.  Patient Is a Poor Historian.  She is here with her sister [name].  I last saw that patient in Jan 2021 at that time I discussed her ZIo monitor which showed NSVT. A pharmacologic nuclear stress test was ordered.  In there interim she was able to get the testing done and this was negative.  Today she offers no complaints.   Past Medical History:  Diagnosis Date  . Asthma   . CKD (chronic kidney disease)   . Depression   . DVT (deep venous thrombosis) ([place])   . 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 GERD (gastroesophageal reflux disease)   . Hypertension   . Hypothyroidism   . Low blood pressure 09/27/2018  . Osteoarthritis   . Schizophrenia ([place])   . Seizures ([place])   . Sepsis [facility])     Past Surgical History:  Procedure Laterality Date  . ABDOMINAL HYSTERECTOMY    . IR FLUORO GUIDE CV LINE RIGHT  08/24/2018  . IR REMOVAL TUN CV CATH W/O FL  09/29/2018  . IR US GUIDE VASC ACCESS RIGHT  08/24/2018  . TEE WITHOUT CARDIOVERSION N/A 08/22/2018   Procedure: TRANSESOPHAGEAL ECHOCARDIOGRAM (TEE);  Surgeon: Lelon Perla, MD;  Location[facility] ENDOSCOPY;  Service: Cardiovascular;  Laterality: N/A;  . THYROIDECTOMY, PARTIAL      Current Medications: Current Meds  Medication Sig  . acetaminophen (TYLENOL) 500 MG tablet Take 1,000 mg by mouth every 6 (six) hours as needed for mild pain or moderate pain.  Marland Kitchen albuterol (VENTOLIN HFA) 108 (90 Base) MCG/ACT inhaler Inhale 2 puffs into the lungs every 6 (six)  hours as needed for wheezing or shortness of breath.  Marland Kitchen apixaban (ELIQUIS) 2.5 MG TABS tablet Take 1 tablet (2.5 mg total) by mouth 2 (two) times daily.  Marland Kitchen aspirin EC 81 MG tablet Take 81 mg by mouth daily.  . benztropine (COGENTIN) 1 MG tablet Take 1 mg by mouth 2 (two) times daily.  . clonazePAM (KLONOPIN) 0.5 MG tablet Take 0.5 mg by mouth daily.  Marland Kitchen FANAPT 6 MG TABS TAKE ONE-HALF (1/2) TABLET BY MOUTH EVERY MORNING AND ONE  and  ONE-HALF (1  and  1/2) TABLETS BY MOUTH EVERY EVENING (ROUND WHITE TABLET WI  . furosemide (LASIX) 40 MG tablet Take 1 tab (40 mg ) on Tues , Thurs, and Saturdays  . nortriptyline (PAMELOR) 25 MG capsule Take 25 mg by mouth 2 (two) times daily.  . pravastatin (PRAVACHOL) 40 MG tablet Take 1 tablet (40 mg total) by mouth daily.  . sertraline (ZOLOFT) 50 MG tablet Take 50 mg by mouth at bedtime.  . solifenacin (VESICARE) 5 MG tablet Take 5 mg by mouth daily.  . traMADol (ULTRAM) 50 MG tablet Take 50 mg by mouth every 6 (six) hours as needed.     Allergies:   Aspirin, Penicillin g, Penicillins, Shellfish allergy, and Sulfa antibiotics   Social History   Socioeconomic History  . Marital  status: Single    Spouse name: Not on file  . Number of children: Not on file  . Years of education: Not on file  . Highest education level: Not on file  Occupational History  . Not on file  Tobacco Use  . Smoking status: Never Smoker  . Smokeless tobacco: Never Used  Substance and Sexual Activity  . Alcohol use: Never  . Drug use: Never  . Sexual activity: Not Currently  Other Topics Concern  . Not on file  Social History Narrative  . Not on file   Social Determinants of Health   Financial Resource Strain:   . Difficulty of Paying Living Expenses:   Food Insecurity:   . Worried About Charity fundraiser in the Last Year:   . Arboriculturist in the Last Year:   Transportation Needs:   . Film/video editor (Medical):   Marland Kitchen Lack of Transportation (Non-Medical):     Physical Activity:   . Days of Exercise per Week:   . Minutes of Exercise per Session:   Stress:   . Feeling of Stress :   Social Connections:   . Frequency of Communication with Friends and Family:   . Frequency of Social Gatherings with Friends and Family:   . Attends Religious Services:   . Active Member of Clubs or Organizations:   . Attends Archivist Meetings:   Marland Kitchen Marital Status:      Family History: The patient's family history includes Diabetes in her brother and sister; Hypertension in her brother, mother, and sister.  ROS:   Review of Systems  Constitution: Negative for decreased appetite, fever and weight gain.  HENT: Negative for congestion, ear discharge, hoarse voice and sore throat.   Eyes: Negative for discharge, redness, vision loss in right eye and visual halos.  Cardiovascular: Negative for chest pain, dyspnea on exertion, leg swelling, orthopnea and palpitations.  Respiratory: Negative for cough, hemoptysis, shortness of breath and snoring.   Endocrine: Negative for heat intolerance and polyphagia.  Hematologic/Lymphatic: Negative for bleeding problem. Does not bruise/bleed easily.  Skin: Negative for flushing, nail changes, rash and suspicious lesions.  Musculoskeletal: Negative for arthritis, joint pain, muscle cramps, myalgias, neck pain and stiffness.  Gastrointestinal: Negative for abdominal pain, bowel incontinence, diarrhea and excessive appetite.  Genitourinary: Negative for decreased libido, genital sores and incomplete emptying.  Neurological: Negative for brief paralysis, focal weakness, headaches and loss of balance.  Psychiatric/Behavioral: Negative for altered mental status, depression and suicidal ideas.  Allergic/Immunologic: Negative for HIV exposure and persistent infections.    EKGs/Labs/Other Studies Reviewed:    The following studies were reviewed today:   EKG: None today  Lexiscan 03/13/2019  There was no ST segment  deviation noted during stress.  The study is normal. No evidence of ischemia.  This is a low risk study.  Study was not gated because of frequent PVCs.     ZIO monitor The patient wore the monitor for 14days starting 12/08/2018. Indication: Palpitations The minimum heart rate was 45bpm, maximum heart rate was 171bpm, and average HRwas 72bpm. Predominant underlying rhythm was Sinus Rhythm.  4 Ventricular Tachycardia runs occurred, the run with the fastest interval lasting 4 beats with a maximum heart rate of 162bpm, the longest lasting 19 beats with an average heart rate of 113 bpm.  4 Supraventricular Tachycardia runs occurred, the run with the fastest interval lasting 5 beats with a max rate of 171 bpm, the longest lasting 12.2 secs with  an avg rate of 106 bpm.  Premature atrial complexes were rare (<1.0%). Premature Ventricular complexeswere were occasional (2.8%,40197). Ventricular Couplets were rare (<1.0%, 1233), and Ventricular Triplets were rare (<1.0%). Ventricular Bigeminy and Trigeminy were present.  No pauses, No AV block and no atrial fibrillation present. Nopatient triggered eventsor diary events are noted.  Conclusion: This study is remarkable for the following: 1. 4 episodes of nonsustained ventricular tachycardia. 2. 5 episodes of supraventricular tachycardia noted which is likely atrial tachycardia with variable block.    Recent Labs: 08/12/2018: TSH 1.773 09/28/2018: ALT 11 09/29/2018: Hemoglobin 10.3; Platelets 91 02/05/2019: Magnesium 2.3 02/13/2019: BUN 34; Creatinine, Ser 2.93; Potassium 5.8; Sodium 144  Recent Lipid Panel    Component Value Date/Time   TRIG 49 08/13/2018 0347    Physical Exam:    VS:  BP 122/84   Pulse (!) 59   Ht 5\' 2"  (1.575 m)   Wt 227 lb (103 kg)   SpO2 94%   BMI 41.52 kg/m     Wt Readings from Last 3 Encounters:  05/09/19 227 lb (103 kg)  03/13/19  221 lb (100.2 kg)  02/05/19 221 lb (100.2 kg)     GEN: Well nourished, well developed in no acute distress HEENT: Normal NECK: No JVD; No carotid bruits LYMPHATICS: No lymphadenopathy CARDIAC: S1S2 noted,RRR, no murmurs, rubs, gallops RESPIRATORY:  Clear to auscultation without rales, wheezing or rhonchi  ABDOMEN: Soft, non-tender, non-distended, +bowel sounds, no guarding. EXTREMITIES: trace bilateral lower extremity edema, No cyanosis, no clubbing MUSCULOSKELETAL:  No deformity  SKIN: Warm and dry NEUROLOGIC:  Alert and oriented x 3, non-focal PSYCHIATRIC:  Normal affect, good insight  ASSESSMENT:    1. Essential hypertension   2. CKD (chronic kidney disease), stage IV ([place])   3. Morbid obesity ([place])   4. Chronic diastolic heart failure ([place])   5. Mixed hyperlipidemia    PLAN:     He blood pressure is acceptable in the office today. I will continue her current regimen.   She appears to be Euvolemic - I will continue her current diuretic dose with potassium supplement.  Hyperlipidemia - continue her pravastatin   She is on Eliquis for DVT/PE - I discuss with the patient that I will not be able to send any refills of this medication as I do believe she needs to discuss with her pcp and/or hematologist for plan for stopping this medication. She does not have atrial fibrillation - no evidence of afib on her monitor as well.   CKD - avoid nephrotoxins.   Blood work for [employer] and mag today.  The patient is in agreement with the above plan. The patient left the office in stable condition.  The patient will follow up in 6 months or sooner if needed.   Medication Adjustments/Labs and Tests Ordered: Current medicines are reviewed at length with the patient today.  Concerns regarding medicines are outlined above.  Orders Placed This Encounter  Procedures  . Basic metabolic panel  . Magnesium   No orders of the defined types were placed in this encounter.   Patient  Instructions  Medication Instructions:  No medication changes *If you need a refill on your cardiac medications before your next appointment, please call your pharmacy*   Lab Work: Your physician recommends that you have a BMET and magnesium today in the office.   If you have labs (blood work) drawn today and your tests are completely normal, you will receive your results only by: Marland Kitchen MyChart Message (  if you have MyChart) OR . A paper copy in the mail If you have any lab test that is abnormal or we need to change your treatment, we will call you to review the results.   Testing/Procedures: None ordered   Follow-Up: At [facility], you and your health needs are our priority.  As part of our continuing mission to provide you with exceptional heart care, we have created designated Provider Care Teams.  These Care Teams include your primary Cardiologist (physician) and Advanced Practice Providers (APPs -  Physician Assistants and Nurse Practitioners) who all work together to provide you with the care you need, when you need it.  We recommend signing up for the patient portal called "MyChart".  Sign up information is provided on this After Visit Summary.  MyChart is used to connect with patients for Virtual Visits (Telemedicine).  Patients are able to view lab/test results, encounter notes, upcoming appointments, etc.  Non-urgent messages can be sent to your provider as well.   To learn more about what you can do with MyChart, go to [email or web address].    Your next appointment:   6 month(s)  The format for your next appointment:   In Person  Provider:   Berniece Salines, DO   Other Instructions NA'    Adopting a Healthy Lifestyle.  Know what a healthy weight is for you (roughly BMI <25) and aim to maintain this   Aim for 7+ servings of fruits and vegetables daily   65-80+ fluid ounces of water or unsweet tea for healthy kidneys   Limit to max 1 drink of alcohol per day;  avoid smoking/tobacco   Limit animal fats in diet for cholesterol and heart health - choose grass fed whenever available   Avoid highly processed foods, and foods high in saturated/trans fats   Aim for low stress - take time to unwind and care for your mental health   Aim for 150 min of moderate intensity exercise weekly for heart health, and weights twice weekly for bone health   Aim for 7-9 hours of sleep daily   When it comes to diets, agreement about the perfect plan isnt easy to find, even among the experts. Experts at the [employer] developed an idea known as the Healthy Eating Plate. Just imagine a plate divided into logical, healthy portions.   The emphasis is on diet quality:   Load up on vegetables and fruits - one-half of your plate: Aim for color and variety, and remember that potatoes dont count.   Go for whole grains - one-quarter of your plate: Whole wheat, barley, wheat berries, quinoa, oats, brown rice, and foods made with them. If you want pasta, go with whole wheat pasta.   Protein power - one-quarter of your plate: Fish, chicken, beans, and nuts are all healthy, versatile protein sources. Limit red meat.   The diet, however, does go beyond the plate, offering a few other suggestions.   Use healthy plant oils, such as olive, canola, soy, corn, sunflower and peanut. Check the labels, and avoid partially hydrogenated oil, which have unhealthy trans fats.   If youre thirsty, drink water. Coffee and tea are good in moderation, but skip sugary drinks and limit milk and dairy products to one or two daily servings.   The type of carbohydrate in the diet is more important than the amount. Some sources of carbohydrates, such as vegetables, fruits, whole grains, and beans-are healthier than others.   Finally, stay  active  Signed, Berniece Salines, DO  05/09/2019 9:26 PM    [facility]

## 2019-05-09 NOTE — Patient Instructions (Signed)
Medication Instructions:  No medication changes *If you need a refill on your cardiac medications before your next appointment, please call your pharmacy*   Lab Work: Your physician recommends that you have a BMET and magnesium today in the office.   If you have labs (blood work) drawn today and your tests are completely normal, you will receive your results only by: Marland Kitchen MyChart Message (if you have MyChart) OR . A paper copy in the mail If you have any lab test that is abnormal or we need to change your treatment, we will call you to review the results.   Testing/Procedures: None ordered   Follow-Up: At [facility], you and your health needs are our priority.  As part of our continuing mission to provide you with exceptional heart care, we have created designated Provider Care Teams.  These Care Teams include your primary Cardiologist (physician) and Advanced Practice Providers (APPs -  Physician Assistants and Nurse Practitioners) who all work together to provide you with the care you need, when you need it.  We recommend signing up for the patient portal called "MyChart".  Sign up information is provided on this After Visit Summary.  MyChart is used to connect with patients for Virtual Visits (Telemedicine).  Patients are able to view lab/test results, encounter notes, upcoming appointments, etc.  Non-urgent messages can be sent to your provider as well.   To learn more about what you can do with MyChart, go to [email or web address].    Your next appointment:   6 month(s)  The format for your next appointment:   In Person  Provider:   Berniece Salines, DO   Other Instructions NA'

## 2019-05-10 ENCOUNTER — Telehealth: Payer: Self-pay

## 2019-05-10 LAB — BASIC METABOLIC PANEL
BUN/Creatinine Ratio: 15 (ref 12–28)
BUN: 42 mg/dL — ABNORMAL HIGH (ref 8–27)
CO2: 24 mmol/L (ref 20–29)
Calcium: 9.2 mg/dL (ref 8.7–10.3)
Chloride: 105 mmol/L (ref 96–106)
Creatinine, Ser: 2.88 mg/dL — ABNORMAL HIGH (ref 0.57–1.00)
GFR calc Af Amer: 20 mL/min/{1.73_m2} — ABNORMAL LOW (ref >59)
GFR calc non Af Amer: 17 mL/min/{1.73_m2} — ABNORMAL LOW (ref >59)
Glucose: 93 mg/dL (ref 65–99)
Potassium: 5 mmol/L (ref 3.5–5.2)
Sodium: 144 mmol/L (ref 134–144)

## 2019-05-10 LAB — MAGNESIUM: Magnesium: 2.2 mg/dL (ref 1.6–2.3)

## 2019-05-10 NOTE — Telephone Encounter (Signed)
-----   Message from Berniece Salines, DO sent at 05/10/2019 11:08 AM EDT ----- Labs stable please notify patient.

## 2019-05-10 NOTE — Telephone Encounter (Signed)
Tried calling patient. No answer and no voicemail set up for me to leave a message. 

## 2019-05-11 NOTE — Telephone Encounter (Signed)
Left message on patients voicemail to please return our call.   

## 2019-05-14 NOTE — Telephone Encounter (Signed)
I am mailing a letter to the patient at this time after trying to contact her x3 with no success.

## 2019-05-14 NOTE — Telephone Encounter (Signed)
Tried calling patient. No answer and no voicemail set up for me to leave a message. 

## 2019-08-01 ENCOUNTER — Other Ambulatory Visit: Payer: Self-pay | Admitting: Cardiology

## 2019-08-31 ENCOUNTER — Other Ambulatory Visit: Payer: Self-pay | Admitting: Cardiology

## 2019-08-31 NOTE — Telephone Encounter (Signed)
Prescription refill request for Eliquis received. Indication: DVT Last office visit: 05/09/2019 Tobb Scr: 2.88 05/09/2019 Age: 64 Weight: 103 kg  Prescription refilled

## 2019-10-03 ENCOUNTER — Other Ambulatory Visit: Payer: Self-pay | Admitting: Cardiology

## 2019-10-03 NOTE — Telephone Encounter (Signed)
Please advise if appropriate to refill potassium, looks like last time it was prescribed was 01/2019. At last office visit this medication was not listed as actively taken. KW

## 2019-11-10 ENCOUNTER — Inpatient Hospital Stay
Admission: EM | Admit: 2019-11-10 | Discharge: 2019-11-15 | DRG: 416 | Disposition: A | Discharge status: Home / Self Care | Payer: BC Managed Care – PPO | Attending: Surgery | Admitting: Surgery

## 2019-11-10 ENCOUNTER — Emergency Department: Payer: BC Managed Care – PPO

## 2019-11-10 DIAGNOSIS — Z20822 Contact with and (suspected) exposure to covid-19: Secondary | ICD-10-CM | POA: Diagnosis present

## 2019-11-10 DIAGNOSIS — K8000 Calculus of gallbladder with acute cholecystitis without obstruction: Principal | ICD-10-CM | POA: Diagnosis present

## 2019-11-10 DIAGNOSIS — Z9081 Acquired absence of spleen: Secondary | ICD-10-CM

## 2019-11-10 DIAGNOSIS — K660 Peritoneal adhesions (postprocedural) (postinfection): Secondary | ICD-10-CM | POA: Diagnosis present

## 2019-11-10 DIAGNOSIS — Z7982 Long term (current) use of aspirin: Secondary | ICD-10-CM

## 2019-11-10 DIAGNOSIS — Z5331 Laparoscopic surgical procedure converted to open procedure: Secondary | ICD-10-CM

## 2019-11-10 DIAGNOSIS — K810 Acute cholecystitis: Secondary | ICD-10-CM

## 2019-11-10 DIAGNOSIS — R1011 Right upper quadrant pain: Secondary | ICD-10-CM

## 2019-11-10 DIAGNOSIS — Z8619 Personal history of other infectious and parasitic diseases: Secondary | ICD-10-CM

## 2019-11-10 DIAGNOSIS — Z96652 Presence of left artificial knee joint: Secondary | ICD-10-CM | POA: Diagnosis present

## 2019-11-10 HISTORY — DX: Calculus of gallbladder with acute cholecystitis without obstruction: K80.00

## 2019-11-10 LAB — CBC AND DIFFERENTIAL
Basophils Absolute: 0 10*3/uL (ref 0.0–0.3)
Eosinophils %: 2 % (ref 0.0–7.0)
Eosinophils Absolute: 0.3 10*3/uL (ref 0.0–0.8)
Hematocrit: 42.7 % (ref 36.0–48.0)
Hemoglobin: 14.2 gm/dL (ref 12.0–16.0)
Lymphocytes Absolute: 4.8 10*3/uL (ref 0.6–5.1)
Lymphocytes: 31 % (ref 15.0–46.0)
MCH: 33 pg (ref 28–35)
MCHC: 33 gm/dL (ref 32–36)
MCV: 98 fL (ref 80–100)
MPV: 8.6 fL (ref 6.0–10.0)
Monocytes Absolute: 1.2 10*3/uL (ref 0.1–1.7)
Monocytes: 8 % (ref 3.0–15.0)
Myelocytes: 1 % — ABNORMAL HIGH (ref 0–0)
Neutrophils %: 58 % (ref 42.0–78.0)
Neutrophils Absolute: 9.1 10*3/uL — ABNORMAL HIGH (ref 1.7–8.6)
PLT CT: 330 10*3/uL (ref 130–440)
RBC: 4.37 10*6/uL (ref 3.80–5.00)
RDW: 12 % (ref 11.0–14.0)
WBC: 15.5 10*3/uL — ABNORMAL HIGH (ref 4.0–11.0)

## 2019-11-10 LAB — COMPREHENSIVE METABOLIC PANEL
ALT: 18 U/L (ref 0–55)
AST (SGOT): 17 U/L (ref 10–42)
Albumin/Globulin Ratio: 0.91 Ratio (ref 0.80–2.00)
Albumin: 4.2 gm/dL (ref 3.5–5.0)
Alkaline Phosphatase: 78 U/L (ref 40–145)
Anion Gap: 11.9 mMol/L (ref 7.0–18.0)
BUN / Creatinine Ratio: 26.2 Ratio (ref 10.0–30.0)
BUN: 17 mg/dL (ref 7–22)
Bilirubin, Total: 0.4 mg/dL (ref 0.1–1.2)
CO2: 25 mMol/L (ref 20–30)
Calcium: 9.8 mg/dL (ref 8.5–10.5)
Chloride: 105 mMol/L (ref 98–110)
Creatinine: 0.65 mg/dL (ref 0.60–1.20)
EGFR: 96 mL/min/{1.73_m2} (ref 60–150)
Globulin: 4.6 gm/dL — ABNORMAL HIGH (ref 2.0–4.0)
Glucose: 85 mg/dL (ref 71–99)
Osmolality Calculated: 276 mOsm/kg (ref 275–300)
Potassium: 3.9 mMol/L (ref 3.5–5.3)
Protein, Total: 8.8 gm/dL — ABNORMAL HIGH (ref 6.0–8.3)
Sodium: 138 mMol/L (ref 136–147)

## 2019-11-10 LAB — VH XPERT XPRESS(C) SARS-COV-2 QUALITATIVE PCR
Does patient have symptoms related to condition of interest?: NEGATIVE
Does patient reside in a congregate care setting?: NEGATIVE
Is patient employed in a healthcare setting?: NEGATIVE
Is the patient hospitalized because of this condition?: NEGATIVE
Is the patient pregnant?: NEGATIVE
SARS CoV-2 by PCR: NEGATIVE

## 2019-11-10 LAB — LIPASE: Lipase: 29 U/L (ref 8–78)

## 2019-11-10 LAB — HCG, SERUM, QUALITATIVE: BHCG Qualitative: NEGATIVE

## 2019-11-10 MED ORDER — VH BIO-K PLUS PROBIOTIC 50 BIL CFU CAPSULE
50.0000 | DELAYED_RELEASE_CAPSULE | Freq: Every day | ORAL | Status: DC
Start: 2019-11-11 — End: 2019-11-12
  Administered 2019-11-11 – 2019-11-12 (×2): 50 via ORAL
  Filled 2019-11-10 (×2): qty 1

## 2019-11-10 MED ORDER — MORPHINE SULFATE 4 MG/ML IJ/IV SOLN (WRAP)
4.0000 mg | Freq: Once | Status: AC
Start: 2019-11-10 — End: 2019-11-10
  Administered 2019-11-10: 20:00:00 4 mg via INTRAVENOUS

## 2019-11-10 MED ORDER — SODIUM CHLORIDE 0.9 % IV SOLN
INTRAVENOUS | Status: AC
Start: 2019-11-10 — End: ?
  Filled 2019-11-10: qty 100

## 2019-11-10 MED ORDER — SODIUM CHLORIDE (PF) 0.9 % IJ SOLN
20.0000 mg | Freq: Two times a day (BID) | INTRAVENOUS | Status: DC
Start: 2019-11-11 — End: 2019-11-12
  Administered 2019-11-11 – 2019-11-12 (×3): 20 mg via INTRAVENOUS
  Filled 2019-11-10: qty 2
  Filled 2019-11-10: qty 10
  Filled 2019-11-10 (×2): qty 2

## 2019-11-10 MED ORDER — ONDANSETRON HCL 4 MG/2ML IJ SOLN
4.0000 mg | Freq: Once | INTRAMUSCULAR | Status: AC
Start: 2019-11-10 — End: 2019-11-10
  Administered 2019-11-10: 20:00:00 4 mg via INTRAVENOUS

## 2019-11-10 MED ORDER — ONDANSETRON 4 MG PO TBDP
4.0000 mg | ORAL_TABLET | Freq: Three times a day (TID) | ORAL | Status: DC | PRN
Start: 2019-11-10 — End: 2019-11-15

## 2019-11-10 MED ORDER — SODIUM CHLORIDE (PF) 0.9 % IJ SOLN
3.0000 mL | Freq: Three times a day (TID) | INTRAMUSCULAR | Status: DC
Start: 2019-11-11 — End: 2019-11-15
  Administered 2019-11-10 – 2019-11-15 (×14): 3 mL via INTRAVENOUS

## 2019-11-10 MED ORDER — VH HEPARIN SODIUM (PORCINE) 5000 UNIT/ML IJ SOLN
5000.0000 [IU] | Freq: Three times a day (TID) | INTRAMUSCULAR | Status: DC
Start: 2019-11-11 — End: 2019-11-15
  Administered 2019-11-11 – 2019-11-15 (×13): 5000 [IU] via SUBCUTANEOUS
  Filled 2019-11-10 (×12): qty 1

## 2019-11-10 MED ORDER — ACETAMINOPHEN 160 MG/5ML PO SOLN
1000.0000 mg | Freq: Four times a day (QID) | ORAL | Status: DC
Start: 2019-11-10 — End: 2019-11-15

## 2019-11-10 MED ORDER — SODIUM CHLORIDE 0.9 % IV MBP
3.3750 g | Freq: Once | INTRAVENOUS | Status: AC
Start: 2019-11-10 — End: 2019-11-10
  Administered 2019-11-10: 21:00:00 3.375 g via INTRAVENOUS

## 2019-11-10 MED ORDER — ACETAMINOPHEN 500 MG PO TABS
1000.0000 mg | ORAL_TABLET | Freq: Four times a day (QID) | ORAL | Status: DC
Start: 2019-11-10 — End: 2019-11-15
  Administered 2019-11-10 – 2019-11-15 (×17): 1000 mg via ORAL
  Filled 2019-11-10 (×17): qty 2

## 2019-11-10 MED ORDER — KETOROLAC TROMETHAMINE 15 MG/ML IJ SOLN
15.0000 mg | Freq: Once | INTRAMUSCULAR | Status: AC
Start: 2019-11-10 — End: 2019-11-10
  Administered 2019-11-10: 20:00:00 15 mg via INTRAVENOUS

## 2019-11-10 MED ORDER — MORPHINE SULFATE 2 MG/ML IJ/IV SOLN (WRAP)
2.0000 mg | Status: DC | PRN
Start: 2019-11-10 — End: 2019-11-12
  Administered 2019-11-11: 2 mg via INTRAVENOUS
  Filled 2019-11-10: qty 1

## 2019-11-10 MED ORDER — SODIUM CHLORIDE (PF) 0.9 % IJ SOLN
0.4000 mg | INTRAMUSCULAR | Status: DC | PRN
Start: 2019-11-10 — End: 2019-11-15

## 2019-11-10 MED ORDER — MORPHINE SULFATE 4 MG/ML IJ/IV SOLN (WRAP)
Status: AC
Start: 2019-11-10 — End: ?
  Filled 2019-11-10: qty 1

## 2019-11-10 MED ORDER — KETOROLAC TROMETHAMINE 15 MG/ML IJ SOLN
INTRAMUSCULAR | Status: AC
Start: 2019-11-10 — End: ?
  Filled 2019-11-10: qty 1

## 2019-11-10 MED ORDER — PROCHLORPERAZINE EDISYLATE 10 MG/2ML IJ SOLN
5.0000 mg | INTRAMUSCULAR | Status: DC | PRN
Start: 2019-11-10 — End: 2019-11-15

## 2019-11-10 MED ORDER — SODIUM CHLORIDE 0.9 % IV BOLUS
1000.0000 mL | Freq: Once | INTRAVENOUS | Status: AC
Start: 2019-11-10 — End: 2019-11-10
  Administered 2019-11-10: 20:00:00 1000 mL via INTRAVENOUS

## 2019-11-10 MED ORDER — ONDANSETRON HCL 4 MG/2ML IJ SOLN
INTRAMUSCULAR | Status: AC
Start: 2019-11-10 — End: ?
  Filled 2019-11-10: qty 2

## 2019-11-10 MED ORDER — SODIUM CHLORIDE 0.9 % IV SOLN
INTRAVENOUS | Status: DC
Start: 2019-11-11 — End: 2019-11-11

## 2019-11-10 MED ORDER — PIPERACILLIN-TAZOBACTAM IN DEX 3-0.375 GM/50ML IV SOLN
3.3750 g | Freq: Four times a day (QID) | INTRAVENOUS | Status: DC
Start: 2019-11-11 — End: 2019-11-15
  Administered 2019-11-11 – 2019-11-15 (×17): 3.375 g via INTRAVENOUS
  Filled 2019-11-10 (×18): qty 50

## 2019-11-10 MED ORDER — PIPERACILLIN SOD-TAZOBACTAM SO 3.375 (3-0.375) G IV SOLR
INTRAVENOUS | Status: AC
Start: 2019-11-10 — End: ?
  Filled 2019-11-10: qty 3.38

## 2019-11-10 MED ORDER — ONDANSETRON HCL 4 MG/2ML IJ SOLN
4.0000 mg | Freq: Three times a day (TID) | INTRAMUSCULAR | Status: DC | PRN
Start: 2019-11-10 — End: 2019-11-15
  Administered 2019-11-12 – 2019-11-13 (×2): 4 mg via INTRAVENOUS
  Filled 2019-11-10 (×3): qty 2

## 2019-11-10 NOTE — H&P (Signed)
HISTORY AND PHYSICAL - ACCESS   Name:  Peggy Hernandez, Peggy Hernandez                 MR#:  [id number]               Date:  11/10/19     IMPRESSION:   64 y.o. female  with Acute cholecystitis.  Patient symptoms and physical exam, significant tenderness in right upper quadrant positive radiographic Murphy sign are consistent with acute cholecystitis, she does have leukocytosis and normal liver function tests.  I recommend admission to the hospital and proceeding with laparoscopic possible open cholecystectomy in a.m.    Additional Diagnoses being addressed this admission also include:  Active Hospital Problems    Diagnosis    Acute cholecystitis    History of splenectomy       PLAN:   Admit to surgery service  IV Zosyn  Lap chole possible open      Arnoldo Hooker, MD      ACCESS [phone] or #[phone]     CC:  Patient complains of abdominal pain nausea and vomiting     HPI:   64 y.o. female presents with a complaint of abdominal pain nausea and vomiting.  The patient has had symptoms of intermittent right upper quadrant pain nausea and vomiting for a few months, had a moderately severe attack about 6 days ago and another severe attack over the past 24 hours that brought her into the emergency department, she has not been able to tolerate food and has been having frequent nausea and vomiting, has constant pain in the right upper quadrant, no fever no chills, her pain is worse after eating, mostly happens in the morning, is crampy in nature, nonradiating, improved after she received some pain medications in the emergency department    PMH:   She  has a past medical history of Shingles.     PSH:   She  has a past surgical history that includes Splenectomy and Replacement total knee (Left).     Social History:   She  reports that she has never smoked. She has never used smokeless tobacco. She reports that she does not drink alcohol and does not use drugs.    Family History:   Family history was reviewed as found to be  noncontributory      Home Medications:     No current facility-administered medications for this encounter.     Current Outpatient Medications   Medication Sig Dispense Refill    aspirin EC 81 MG EC tablet Take 81 mg by mouth daily      Calcium Carbonate-Vitamin D (CALTRATE 600+D PO) Take by mouth         Allergies:   She has No Known Allergies.     ROS(10 of 14):   Constitutional:  Denies fever or chills   Eyes:  Denies change in visual acuity   HENT:  Denies nasal congestion or sore throat   Respiratory:  Denies cough or shortness of breath   Cardiovascular:  Denies chest pain or edema   GI:  Reports abdominal pain, nausea, vomiting, no bloody stools or diarrhea   GU:  Denies dysuria   Musculoskeletal:  Denies back pain or joint pain   Integument:  Denies rash   Neurologic:  Denies headache, focal weakness or sensory changes   Endocrine:  Denies polyuria or polydipsia   Lymphatic:  Denies swollen glands   Psychiatric:  Denies depression or anxiety  Physical Exam(8 of 12):   Blood pressure 135/77, pulse 61, temperature 98.4 F (36.9 C), temperature source Temporal, resp. rate 14, height 1.676 m (5\' 6" ), weight 74.3 kg (163 lb 12.8 oz), SpO2 96 %.   General: Not in acute distress, alert and oriented x 3, non toxic  HEENT(2-eye/+): normocephalic, atraumatic, sclera clear and anicteric  Neck(0): supple, no lymphadenopathy, trachea midline  Chest: Clear to ausculation bilaterally, no deformities, no crepitus  Cardiac: Regular rate and rythm  Abdomen: Soft, non distended, focally tender right upper quadrant, positive Murphy sign, well-healed midline incision  Extremities: no edema, no cyanosis or clubbing and no tenderness or deformities noted  Skin: no jaundice, no rashes    Labs:    Lab results have been reviewed as follows:  Results     Procedure Component Value Units Date/Time    CBC and differential [[id number]]  (Abnormal) Collected: 11/10/19 1926    Specimen: Blood Updated: 11/10/19 2006     WBC 15.5 K/cmm       RBC 4.37 M/cmm      Hemoglobin 14.2 gm/dL      Hematocrit 40.9 %      MCV 98 fL      MCH 33 pg      MCHC 33 gm/dL      RDW 81.1 %      PLT CT 330 K/cmm      MPV 8.6 fL      Neutrophils % 58.0 %      Lymphocytes 31.0 %      Monocytes 8.0 %      Eosinophils % 2.0 %      Myelocytes 1 %      Neutrophils Absolute 9.1 K/cmm      Lymphocytes Absolute 4.8 K/cmm      Monocytes Absolute 1.2 K/cmm      Eosinophils Absolute 0.3 K/cmm      Basophils Absolute 0.0 K/cmm      RBC Morphology Morphology Consistent with Hemogram    Narrative:      Manual differential performed    Beta HCG, Qual, Serum [[id number]] Collected: 11/10/19 1926    Specimen: Plasma Updated: 11/10/19 2003     BHCG Qualitative Negative    Comprehensive metabolic panel [[id number]]  (Abnormal) Collected: 11/10/19 1926    Specimen: Plasma Updated: 11/10/19 2003     Sodium 138 mMol/L      Potassium 3.9 mMol/L      Chloride 105 mMol/L      CO2 25 mMol/L      Calcium 9.8 mg/dL      Glucose 85 mg/dL      Creatinine 4.69 mg/dL      BUN 17 mg/dL      Protein, Total 8.8 gm/dL      Albumin 4.2 gm/dL      Alkaline Phosphatase 78 U/L      ALT 18 U/L      AST (SGOT) 17 U/L      Bilirubin, Total 0.4 mg/dL      Albumin/Globulin Ratio 0.91 Ratio      Anion Gap 11.9 mMol/L      BUN / Creatinine Ratio 26.2 Ratio      EGFR 96 mL/min/1.10m2      Osmolality Calculated 276 mOsm/kg      Globulin 4.6 gm/dL     Lipase [[id number]] Collected: 11/10/19 1926    Specimen: Plasma Updated: 11/10/19 2000     Lipase 29 U/L  Radiology:   US Abdomen Limited Ruq    Result Date: 11/10/2019  1. Cholelithiasis with tenderness to scanning over the gallbladder may represent acute cholecystitis. No ductal dilatation or free fluid. ReadingStation:[email or web address]

## 2019-11-10 NOTE — ED Provider Notes (Signed)
History     Chief Complaint   Patient presents with    Abdominal Pain     Patient states that over the past month she has had intermittent episodes of right upper quadrant abdominal pain with nausea and vomiting.  Patient states that today was little bit more persistent so came to the ED to see exactly what was going on.    The history is provided by the patient.   Abdominal Pain  Pain location:  RUQ  Pain quality: aching    Pain radiates to:  Does not radiate  Pain severity:  Mild  Onset quality:  Gradual  Timing:  Intermittent  Progression:  Waxing and waning  Chronicity:  Recurrent  Context: not alcohol use and not previous surgeries    Relieved by:  Nothing  Worsened by:  Nothing  Ineffective treatments:  None tried  Associated symptoms: no anorexia, no chest pain, no constipation, no cough, no dysuria, no fever and no shortness of breath    Risk factors: has not had multiple surgeries         Past Medical History:   Diagnosis Date    Shingles        Past Surgical History:   Procedure Laterality Date    REPLACEMENT TOTAL KNEE Left     SPLENECTOMY         History reviewed. No pertinent family history.    Social  Social History     Tobacco Use    Smoking status: Never Smoker    Smokeless tobacco: Never Used   Substance Use Topics    Alcohol use: Never    Drug use: Never       .     No Known Allergies    Home Medications     Med List Status: In Progress Set By: Lauree Chandler, RN at 11/10/2019  7:40 PM                aspirin EC 81 MG EC tablet     Take 81 mg by mouth daily     Calcium Carbonate-Vitamin D (CALTRATE 600+D PO)     Take by mouth           Review of Systems   Constitutional: Negative for fever.   HENT: Negative for congestion, drooling, tinnitus, trouble swallowing and voice change.    Eyes: Negative for pain, redness and visual disturbance.   Respiratory: Negative for cough, chest tightness and shortness of breath.    Cardiovascular: Negative for chest pain.   Gastrointestinal:  Positive for abdominal pain. Negative for anorexia and constipation.   Genitourinary: Negative for dysuria and flank pain.   Musculoskeletal: Negative for arthralgias, back pain, neck pain and neck stiffness.   Neurological: Negative for dizziness, seizures, weakness, numbness and headaches.   Psychiatric/Behavioral: Negative for agitation.       Physical Exam    BP: (!) 168/91, Heart Rate: 94, Temp: 98.4 F (36.9 C), Resp Rate: 20, SpO2: 97 %, Weight: 74.3 kg    Physical Exam  Vitals and nursing note reviewed.   Constitutional:       General: She is not in acute distress.     Appearance: Normal appearance. She is not ill-appearing.   HENT:      Head: Normocephalic and atraumatic.      Right Ear: External ear normal.      Left Ear: External ear normal.      Nose: Nose normal.  Mouth/Throat:      Mouth: Mucous membranes are moist.   Eyes:      Extraocular Movements: Extraocular movements intact.      Conjunctiva/sclera: Conjunctivae normal.   Cardiovascular:      Rate and Rhythm: Normal rate.      Pulses: Normal pulses.   Pulmonary:      Effort: No respiratory distress.   Chest:      Chest wall: No tenderness.   Abdominal:      General: Abdomen is flat.      Palpations: Abdomen is soft.      Tenderness: There is abdominal tenderness. There is guarding and rebound.   Musculoskeletal:         General: No swelling. Normal range of motion.      Cervical back: Normal range of motion and neck supple. No tenderness.   Skin:     General: Skin is warm.      Capillary Refill: Capillary refill takes less than 2 seconds.      Findings: No bruising.   Neurological:      General: No focal deficit present.      Mental Status: She is alert and oriented to person, place, and time. Mental status is at baseline.   Psychiatric:         Mood and Affect: Mood normal.           MDM and ED Course     ED Medication Orders (From admission, onward)    Start Ordered     Status Ordering Provider    11/10/19 2130 11/10/19 2107   piperacillin-tazobactam (ZOSYN) 3.375 g in sodium chloride 0.9 % 100 mL IVPB mini-bag plus  Once in ED        Route: Intravenous  Ordered Dose: 3.375 g     Last MAR action: Stopped Janney Priego    11/10/19 1928 11/10/19 1927  sodium chloride 0.9 % bolus 1,000 mL  Once in ED        Route: Intravenous  Ordered Dose: 1,000 mL     Last MAR action: Stopped Kaelem Brach    11/10/19 1928 11/10/19 1927  morphine injection 4 mg  Once in ED        Route: Intravenous  Ordered Dose: 4 mg     Last MAR action: Given Yohan Samons    11/10/19 1928 11/10/19 1927  ondansetron (ZOFRAN) injection 4 mg  Once in ED        Route: Intravenous  Ordered Dose: 4 mg     Last MAR action: Given Rennie Rouch    11/10/19 1928 11/10/19 1927  ketorolac (TORADOL) injection 15 mg  Once        Route: Intravenous  Ordered Dose: 15 mg     Last MAR action: Given Janaya Broy             MDM  Number of Diagnoses or Management Options     Amount and/or Complexity of Data Reviewed  Clinical lab tests: ordered and reviewed  Tests in the radiology section of CPT: ordered and reviewed  Tests in the medicine section of CPT: ordered and reviewed  Discuss the patient with other providers: yes  Independent visualization of images, tracings, or specimens: yes    Risk of Complications, Morbidity, and/or Mortality  Presenting problems: high  Diagnostic procedures: high  Management options: high          ED Course as of 11/10/19 2212   Sat Nov 10, 2019  2106 Dr. Emilio Math surgery will come see pt [MK]      ED Course User Index  [MK] Maryjean Morn, DO             Procedures    Clinical Impression & Disposition     Clinical Impression  Final diagnoses:   Acute cholecystitis        ED Disposition     ED Disposition Condition Date/Time Comment    Admit  Sat Nov 10, 2019  9:07 PM Service: Surgery [124]             New Prescriptions    No medications on file                 Maryjean Morn, DO  11/10/19 2212

## 2019-11-11 ENCOUNTER — Inpatient Hospital Stay: Payer: BC Managed Care – PPO | Admitting: Anesthesiology

## 2019-11-11 ENCOUNTER — Inpatient Hospital Stay (HOSPITAL_COMMUNITY): Payer: BC Managed Care – PPO | Admitting: Anesthesiology

## 2019-11-11 ENCOUNTER — Encounter
Admission: EM | Disposition: A | Discharge status: Home / Self Care | Payer: Self-pay | Source: Home / Self Care | Attending: Surgery

## 2019-11-11 ENCOUNTER — Encounter: Payer: Self-pay | Admitting: Surgery

## 2019-11-11 DIAGNOSIS — K810 Acute cholecystitis: Secondary | ICD-10-CM

## 2019-11-11 HISTORY — PX: LAPAROTOMY, CHOLECYSTECTOMY: SHX4590

## 2019-11-11 HISTORY — PX: LAPAROSCOPIC CHOLECYSTECTOMY CONVERSION: SHX510517

## 2019-11-11 LAB — CBC AND DIFFERENTIAL
Basophils %: 0.8 % (ref 0.0–3.0)
Basophils Absolute: 0.1 10*3/uL (ref 0.0–0.3)
Eosinophils %: 1.3 % (ref 0.0–7.0)
Eosinophils Absolute: 0.2 10*3/uL (ref 0.0–0.8)
Hematocrit: 40.1 % (ref 36.0–48.0)
Hemoglobin: 12.7 gm/dL (ref 12.0–16.0)
Lymphocytes Absolute: 5 10*3/uL (ref 0.6–5.1)
Lymphocytes: 42.1 % (ref 15.0–46.0)
MCH: 31 pg (ref 28–35)
MCHC: 32 gm/dL (ref 32–36)
MCV: 99 fL (ref 80–100)
MPV: 8.7 fL (ref 6.0–10.0)
Monocytes Absolute: 1 10*3/uL (ref 0.1–1.7)
Monocytes: 8.3 % (ref 3.0–15.0)
Neutrophils %: 47.6 % (ref 42.0–78.0)
Neutrophils Absolute: 5.7 10*3/uL (ref 1.7–8.6)
PLT CT: 316 10*3/uL (ref 130–440)
RBC: 4.04 10*6/uL (ref 3.80–5.00)
RDW: 12 % (ref 11.0–14.0)
WBC: 12 10*3/uL — ABNORMAL HIGH (ref 4.0–11.0)

## 2019-11-11 LAB — BASIC METABOLIC PANEL
Anion Gap: 8.5 mMol/L (ref 7.0–18.0)
BUN / Creatinine Ratio: 20 Ratio (ref 10.0–30.0)
BUN: 14 mg/dL (ref 7–22)
CO2: 25 mMol/L (ref 20–30)
Calcium: 9.1 mg/dL (ref 8.5–10.5)
Chloride: 109 mMol/L (ref 98–110)
Creatinine: 0.7 mg/dL (ref 0.60–1.20)
EGFR: 94 mL/min/{1.73_m2} (ref 60–150)
Glucose: 94 mg/dL (ref 71–99)
Osmolality Calculated: 276 mOsm/kg (ref 275–300)
Potassium: 4.5 mMol/L (ref 3.5–5.3)
Sodium: 138 mMol/L (ref 136–147)

## 2019-11-11 SURGERY — LAPAROTOMY, CHOLECYSTECTOMY
Anesthesia: Anesthesia General | Site: Abdomen | Wound class: Contaminated

## 2019-11-11 MED ORDER — KETOROLAC TROMETHAMINE 30 MG/ML IJ SOLN
INTRAMUSCULAR | Status: AC
Start: 2019-11-11 — End: ?
  Filled 2019-11-11: qty 1

## 2019-11-11 MED ORDER — ROCURONIUM BROMIDE 50 MG/5ML IV SOLN
INTRAVENOUS | Status: DC | PRN
Start: 2019-11-11 — End: 2019-11-11
  Administered 2019-11-11: 40 mg via INTRAVENOUS
  Administered 2019-11-11: 10 mg via INTRAVENOUS

## 2019-11-11 MED ORDER — LIDOCAINE-EPINEPHRINE 2 %-1:100000 IJ SOLN
INTRAMUSCULAR | Status: AC
Start: 2019-11-11 — End: ?
  Filled 2019-11-11: qty 20

## 2019-11-11 MED ORDER — DEXTROSE 5 % IN LACTATED RINGERS IV BOLUS
500.0000 mL | Freq: Once | INTRAVENOUS | Status: DC | PRN
Start: 2019-11-11 — End: 2019-11-11

## 2019-11-11 MED ORDER — BUPIVACAINE HCL (PF) 0.5 % IJ SOLN
INTRAMUSCULAR | Status: AC
Start: 2019-11-11 — End: ?
  Filled 2019-11-11: qty 30

## 2019-11-11 MED ORDER — HALOPERIDOL LACTATE 5 MG/ML IJ SOLN
1.0000 mg | Freq: Once | INTRAMUSCULAR | Status: DC | PRN
Start: 2019-11-11 — End: 2019-11-11

## 2019-11-11 MED ORDER — DEXAMETHASONE SODIUM PHOSPHATE 4 MG/ML IJ SOLN
INTRAMUSCULAR | Status: AC
Start: 2019-11-11 — End: ?
  Filled 2019-11-11: qty 1

## 2019-11-11 MED ORDER — FENTANYL CITRATE (PF) 50 MCG/ML IJ SOLN (WRAP)
INTRAMUSCULAR | Status: DC | PRN
Start: 2019-11-11 — End: 2019-11-11
  Administered 2019-11-11 (×2): 50 ug via INTRAVENOUS

## 2019-11-11 MED ORDER — FENTANYL CITRATE (PF) 50 MCG/ML IJ SOLN (WRAP)
INTRAMUSCULAR | Status: AC
Start: 2019-11-11 — End: ?
  Filled 2019-11-11: qty 2

## 2019-11-11 MED ORDER — LACTATED RINGERS IV SOLN
100.0000 mL/h | INTRAVENOUS | Status: DC
Start: 2019-11-11 — End: 2019-11-14
  Administered 2019-11-11 – 2019-11-13 (×5): 100 mL/h via INTRAVENOUS

## 2019-11-11 MED ORDER — VH HYDROMORPHONE HCL PF 1 MG/ML CARPUJECT
INTRAMUSCULAR | Status: AC
Start: 2019-11-11 — End: ?
  Filled 2019-11-11: qty 1

## 2019-11-11 MED ORDER — ONDANSETRON HCL 4 MG/2ML IJ SOLN
4.0000 mg | Freq: Once | INTRAMUSCULAR | Status: DC | PRN
Start: 2019-11-11 — End: 2019-11-11

## 2019-11-11 MED ORDER — KETOROLAC TROMETHAMINE 30 MG/ML IJ SOLN
INTRAMUSCULAR | Status: DC | PRN
Start: 2019-11-11 — End: 2019-11-11
  Administered 2019-11-11: 30 mg via INTRAVENOUS

## 2019-11-11 MED ORDER — KETAMINE HCL 10 MG/ML IJ SOLN
INTRAMUSCULAR | Status: AC
Start: 2019-11-11 — End: ?
  Filled 2019-11-11: qty 20

## 2019-11-11 MED ORDER — HYDROMORPHONE HCL 0.5 MG/0.5 ML IJ SOLN
0.2000 mg | INTRAMUSCULAR | Status: DC | PRN
Start: 2019-11-11 — End: 2019-11-11

## 2019-11-11 MED ORDER — ONDANSETRON HCL 4 MG/2ML IJ SOLN
INTRAMUSCULAR | Status: DC | PRN
Start: 2019-11-11 — End: 2019-11-11
  Administered 2019-11-11: 4 mg via INTRAVENOUS

## 2019-11-11 MED ORDER — LIDOCAINE HCL (PF) 2 % IJ SOLN
INTRAMUSCULAR | Status: AC
Start: 2019-11-11 — End: ?
  Filled 2019-11-11: qty 5

## 2019-11-11 MED ORDER — LIDOCAINE-EPINEPHRINE 2 %-1:200000 IJ SOLN
INTRAMUSCULAR | Status: DC | PRN
Start: 2019-11-11 — End: 2019-11-11
  Administered 2019-11-11: 20 mL

## 2019-11-11 MED ORDER — PROPOFOL 200 MG/20ML IV EMUL
INTRAVENOUS | Status: DC | PRN
Start: 2019-11-11 — End: 2019-11-11
  Administered 2019-11-11: 50 mg via INTRAVENOUS
  Administered 2019-11-11: 10 mg via INTRAVENOUS
  Administered 2019-11-11: 140 mg via INTRAVENOUS

## 2019-11-11 MED ORDER — ONDANSETRON HCL 4 MG/2ML IJ SOLN
INTRAMUSCULAR | Status: AC
Start: 2019-11-11 — End: ?
  Filled 2019-11-11: qty 2

## 2019-11-11 MED ORDER — SUGAMMADEX SODIUM 200 MG/2ML IV SOLN
INTRAVENOUS | Status: DC | PRN
Start: 2019-11-11 — End: 2019-11-11
  Administered 2019-11-11: 200 mg via INTRAVENOUS

## 2019-11-11 MED ORDER — MEPERIDINE HCL 25 MG/ML IJ SOLN
12.5000 mg | Freq: Once | INTRAMUSCULAR | Status: DC | PRN
Start: 2019-11-11 — End: 2019-11-11

## 2019-11-11 MED ORDER — HYDROMORPHONE HCL 0.5 MG/0.5 ML IJ SOLN
0.2500 mg | INTRAMUSCULAR | Status: DC | PRN
Start: 2019-11-11 — End: 2019-11-11

## 2019-11-11 MED ORDER — ACETAMINOPHEN 10 MG/ML IV SOLN
1000.0000 mg | Freq: Once | INTRAVENOUS | Status: DC | PRN
Start: 2019-11-11 — End: 2019-11-11

## 2019-11-11 MED ORDER — IOHEXOL 240 MG/ML IJ SOLN
INTRAMUSCULAR | Status: DC | PRN
Start: 2019-11-11 — End: 2019-11-11
  Administered 2019-11-11: 50 mL

## 2019-11-11 MED ORDER — PROPOFOL 200 MG/20ML IV EMUL
INTRAVENOUS | Status: AC
Start: 2019-11-11 — End: ?
  Filled 2019-11-11: qty 20

## 2019-11-11 MED ORDER — DEXAMETHASONE SODIUM PHOSPHATE 4 MG/ML IJ SOLN
INTRAMUSCULAR | Status: DC | PRN
Start: 2019-11-11 — End: 2019-11-11
  Administered 2019-11-11: 10 mg via INTRAVENOUS

## 2019-11-11 MED ORDER — LIDOCAINE HCL (PF) 2 % IJ SOLN
INTRAMUSCULAR | Status: DC | PRN
Start: 2019-11-11 — End: 2019-11-11
  Administered 2019-11-11: 60 mg via INTRAVENOUS

## 2019-11-11 MED ORDER — OXYCODONE HCL 5 MG PO TABS
5.0000 mg | ORAL_TABLET | Freq: Once | ORAL | Status: AC | PRN
Start: 2019-11-11 — End: 2019-11-11
  Administered 2019-11-11: 5 mg via ORAL

## 2019-11-11 MED ORDER — OXYCODONE HCL 5 MG PO TABS
5.0000 mg | ORAL_TABLET | Freq: Once | ORAL | Status: DC | PRN
Start: 2019-11-11 — End: 2019-11-11
  Filled 2019-11-11: qty 1

## 2019-11-11 MED ORDER — VH HYDROMORPHONE HCL PF 1 MG/ML CARPUJECT
INTRAMUSCULAR | Status: DC | PRN
Start: 2019-11-11 — End: 2019-11-11
  Administered 2019-11-11: .5 mg via INTRAVENOUS
  Administered 2019-11-11: 1 mg via INTRAVENOUS

## 2019-11-11 MED ORDER — FENTANYL CITRATE (PF) 50 MCG/ML IJ SOLN (WRAP)
25.0000 ug | INTRAMUSCULAR | Status: DC | PRN
Start: 2019-11-11 — End: 2019-11-11

## 2019-11-11 MED ORDER — METOCLOPRAMIDE HCL 5 MG/ML IJ SOLN
10.0000 mg | Freq: Once | INTRAMUSCULAR | Status: DC | PRN
Start: 2019-11-11 — End: 2019-11-11

## 2019-11-11 MED ORDER — VH KETAMINE HCL 200 MG/20 ML IV (NARRATOR)
INTRAMUSCULAR | Status: DC | PRN
Start: 2019-11-11 — End: 2019-11-11
  Administered 2019-11-11: 30 mg via INTRAVENOUS

## 2019-11-11 MED ORDER — ONDANSETRON HCL 4 MG/2ML IJ SOLN
4.0000 mg | Freq: Once | INTRAMUSCULAR | Status: AC | PRN
Start: 2019-11-11 — End: 2019-11-11
  Administered 2019-11-11: 18:00:00 4 mg via INTRAVENOUS

## 2019-11-11 MED ORDER — ALBUTEROL SULFATE HFA 108 (90 BASE) MCG/ACT IN AERS
INHALATION_SPRAY | RESPIRATORY_TRACT | Status: DC | PRN
Start: 2019-11-11 — End: 2019-11-11
  Administered 2019-11-11: 10 via RESPIRATORY_TRACT

## 2019-11-11 MED ORDER — LACTATED RINGERS IV BOLUS
250.0000 mL | Freq: Once | INTRAVENOUS | Status: DC | PRN
Start: 2019-11-11 — End: 2019-11-11

## 2019-11-11 MED ORDER — FENTANYL CITRATE (PF) 50 MCG/ML IJ SOLN (WRAP)
25.0000 ug | INTRAMUSCULAR | Status: DC | PRN
Start: 2019-11-11 — End: 2019-11-11
  Administered 2019-11-11 (×2): 25 ug via INTRAVENOUS
  Filled 2019-11-11: qty 2

## 2019-11-11 MED ORDER — LACTATED RINGERS IV SOLN
INTRAVENOUS | Status: DC | PRN
Start: 2019-11-11 — End: 2019-11-11

## 2019-11-11 MED ORDER — MIDAZOLAM HCL 1 MG/ML IJ SOLN (WRAP)
INTRAMUSCULAR | Status: DC | PRN
Start: 2019-11-11 — End: 2019-11-11
  Administered 2019-11-11: 2 mg via INTRAVENOUS

## 2019-11-11 MED ORDER — METOCLOPRAMIDE HCL 5 MG/ML IJ SOLN
INTRAMUSCULAR | Status: DC | PRN
Start: 2019-11-11 — End: 2019-11-11
  Administered 2019-11-11: 10 mg via INTRAVENOUS

## 2019-11-11 MED ORDER — LORAZEPAM 2 MG/ML IJ SOLN
0.5000 mg | INTRAMUSCULAR | Status: DC | PRN
Start: 2019-11-11 — End: 2019-11-11

## 2019-11-11 MED ORDER — MIDAZOLAM HCL 1 MG/ML IJ SOLN (WRAP)
INTRAMUSCULAR | Status: AC
Start: 2019-11-11 — End: ?
  Filled 2019-11-11: qty 2

## 2019-11-11 MED ORDER — BUPIVACAINE HCL (PF) 0.5 % IJ SOLN
INTRAMUSCULAR | Status: DC | PRN
Start: 2019-11-11 — End: 2019-11-11
  Administered 2019-11-11: 30 mL

## 2019-11-11 MED ORDER — DIPHENHYDRAMINE HCL 50 MG/ML IJ SOLN
25.0000 mg | Freq: Four times a day (QID) | INTRAMUSCULAR | Status: DC | PRN
Start: 2019-11-11 — End: 2019-11-11

## 2019-11-11 SURGICAL SUPPLY — 41 items
ADHESIVE DERMABOND HIVIC PEN (Dressings) ×2 IMPLANT
BLANKET UPPER BODY QUILT (Supply) ×2 IMPLANT
CANNISTER 1200CC W/LOCK LID (Supply) ×2 IMPLANT
CANNISTER SUCTION 3000C (Supply) ×2 IMPLANT
CATH IV  HUB 14GA X 2IN (Supply) ×2 IMPLANT
CATH OPEN END URETERAL 4 FR (TDC (Tubes, Draines, Catheters)) ×1
CATH OPEN END URETERAL 4 FR (TDC (Tubes, Drains, Catheters)) ×1 IMPLANT
CHLORAPREP W/ORANGE TINT 26ML (Supply) ×4 IMPLANT
DRAPE C-ARM X-RAY (Supply) ×2 IMPLANT
DRSG COVADERM 2X2 (Dressings) ×8 IMPLANT
DRSG SURGICEL 2 X 14 (Dressings) IMPLANT
ELECTRODE EXTENDED BOVIE (Supply) ×2 IMPLANT
GLOVE BIOGEL UND PI IND SZ 8 (Supply) ×2 IMPLANT
GLOVE BIOGEL UT M PI SURG SZ 8 (Supply) ×2 IMPLANT
GOWN AURORA NONREINF STER XLG (Supply) ×2 IMPLANT
GOWN IMPERV LG W/TOWEL REUS (Supply) ×6 IMPLANT
GOWN WARMING  STANDARD BAIRPAW (Supply) ×2 IMPLANT
HEMOCLIP LARGE 6 (Supply) ×2 IMPLANT
HEMOCLIP MED/LARGE (Supply) ×4 IMPLANT
HEMOCLIP MEDIUM 6 (Supply) ×2 IMPLANT
HOOK L  60-5163-004 (Supply) ×2 IMPLANT
PACK ABDOMINAL LAP CDS760057 (Supply) ×2 IMPLANT
PAD ARMBOARD 20 X 8 (Supply) ×2 IMPLANT
SLEEVE 5MM Z-THREAD (Supply) ×4 IMPLANT
SOL SALINE .9% 1000ML  LF (Solutions) ×2 IMPLANT
SOL SALINE IRRIG 500ML (Solutions) ×2 IMPLANT
SOL WATER STERILE IRRG 1000ML (Solutions) IMPLANT
SPONGE GAUZE 4 X 4 STERILE (Dressings) ×2 IMPLANT
STOPCOCK 2-4WAY DISCOFIX (Supply) ×2 IMPLANT
SURGINEEDLE 120MM (Supply) IMPLANT
SUT VICRYL 0 J346H 36 IN CT-1 (Supply) IMPLANT
SUT VICRYL 0 J603H (Supply) ×4 IMPLANT
SUT VICRYL 4-0 J496H (Supply) ×4 IMPLANT
SYRINGE 30CC LL WIDE FLANGE (Supply) ×4 IMPLANT
SYSTEM INZII RETRIEVAL (Supply) IMPLANT
TAPE MEDIPORE 2 (Supply) ×2 IMPLANT
TIP DISP STRYKERFLOW S/I (Supply) ×2 IMPLANT
TROCAR 11MM BLADED THREADED (Supply) ×2 IMPLANT
TROCAR 12MM GELPORT BALL. HAS (Supply) ×2 IMPLANT
TROCAR 5MM BLADED Z-THREAD (Supply) ×2 IMPLANT
TUBING IRRIG SINGLE TUBE SET (Supply) ×2 IMPLANT

## 2019-11-11 NOTE — Plan of Care (Signed)
Problem: Altered GI Function  Goal: Mobility/Activity is maintained at optimal level for patient  Outcome: Progressing  Flowsheets (Taken 11/11/2019 0722)  Mobility/activity is maintained at optimal level for patient:   Increase mobility as tolerated/progressive mobility   Encourage independent activity per ability   Maintain proper body alignment   Assess for changes in respiratory status, level of consciousness and/or development of fatigue     Problem: Pain interferes with ability to perform ADL  Goal: Pain at adequate level as identified by patient  Outcome: Progressing  Flowsheets (Taken 11/11/2019 0722)  Pain at adequate level as identified by patient:   Identify patient comfort function goal   Assess for risk of opioid induced respiratory depression, including snoring/sleep apnea. Alert healthcare team of risk factors identified.   Assess pain on admission, during daily assessment and/or before any "as needed" intervention(s)   Reassess pain within 30-60 minutes of any procedure/intervention, per Pain Assessment, Intervention, Reassessment (AIR) Cycle   Evaluate if patient comfort function goal is met

## 2019-11-11 NOTE — Op Note (Signed)
FULL OPERATIVE NOTE    Date Time: 11/11/19 4:28 PM  Patient Name: Peggy Hernandez  Attending Physician: Charisse Klinefelter, MD      Date of Operation:   11/11/2019    Providers Performing:   Surgeon(s):  Hebah Bogosian, Azucena Cecil, MD    Anesthesiologist: Stephani Police, MD; Geoffery Lyons, MD  CRNA: Beecher Mcardle, CRNA    Circulator: Lockie Pares, RN  Scrub Person: Wilmer, Lolita Patella, CSA  Charge Nurse: Ottie Glazier, RN  Second Circulator: Dennie Maizes, RN  Second Scrub Person: Kandis Fantasia, CSA  Surgical Resident: Stephens Shire, MD    Anesthesia/Sedation:   general    Operative Procedure:   Laparoscopic converted to open cholecystectomy    Preoperative Diagnosis:   Pre-Op Diagnosis Codes:     * Acute cholecystitis [K81.0]    Postoperative Diagnosis:   Post-Op Diagnosis Codes:     * Acute cholecystitis [K81.0]    Findings:   Acute cholecystitis   Dense adhesions    Indications:   Same    Operative Notes:   After obtaining consent, the patient was taken to the operating room and placed upon the operating room table in the supine position. After a time out and the induction of general anesthesia, the patient was prepped and draped in the usual sterile fashion using chloraprep.    A Hasson trochar was attempted in the RUQ of the abdomen and no window into the peritoneal cavity could be identified.    A Hasson trochar was placed in the right lower abdomen and a laparoscope was inserted. There were dense adhesions. The gallbladder was adherent to the abdominal wall and colon with the colon draped over the gallbladder. There was not a safe laparoscopic window.     Therefore the laparoscopic equipment was removed.     A RUQ incision was made. Adhesions were lysed. The gallbladder was taken down from the anterior abdominal wall. The gallbladder was dissected free from the liver using cautery all the way to the infundibulum. The cystic artery and duct were identified and clipped and divided. There was  a large stone present in the gallbladder neck. The duodenum was also adherent to the gallbladder and carefully dissected away.    The gallbladder was removed. Hemostasis was ensured.        The large incision was closed with #1 Vicryl at the fascia level. The skin incisions were all closed with staples. Dressings were applied. The patient was taken to recovery room in satisfactory condition.      Estimated Blood Loss:    20 mL    Implants:   * No implants in log *    Drains:   Drains: no    Specimens:     ID Type Source Tests Collected by Time Destination   A : gallbladder Cholecystectomy Gallbladder [facility] SURGICAL PATHOLOGY Noemi Chapel, MD 11/11/2019 1601          Complications:   * No complications entered in OR log *      Signed by: Noemi Chapel, MD, MD

## 2019-11-11 NOTE — Anesthesia Preprocedure Evaluation (Addendum)
Anesthesia Evaluation    AIRWAY    Mallampati: II    TM distance: >3 FB  Neck ROM: full  Mouth Opening:full  Planned to use difficult airway equipment: No CARDIOVASCULAR    regular and normal       DENTAL           PULMONARY    clear to auscultation     OTHER FINDINGS    covid neg            Relevant Problems   No relevant active problems               Anesthesia Plan    ASA 2     general               (Pt. Seen and Eval., Anesth. Plan/risks/benefits D/W pt in detail including but not limited to risks of cardiac, pulm, brain, and other vital organ systems, allergic rxns, injuries to eyes/teeth, etc. Pt. Understood fully, all questions answered. Consent obtained. Tolerates > 4 METS, NPO > 8 hrs, Goes by Peggy Hernandez.)      intravenous induction   Detailed anesthesia plan: general endotracheal      Post Op: plan for postoperative opioid use    Post op pain management: per surgeon    informed consent obtained    Plan discussed with CRNA.    ECG reviewed  pertinent labs reviewed             Signed by: Geoffery Lyons, MD 11/11/19 1:24 PM

## 2019-11-11 NOTE — Progress Notes (Addendum)
NURSE NOTE SUMMARY  [facility] - 4TH SURGICAL   Patient Name: Peggy Hernandez   Attending Physician: Charisse Klinefelter, MD   Today's date:   11/11/2019 LOS: 1 days   Shift Summary:                                                              0720 - A&O x 4, pleasant. Pain mild, 1/10 in upper abd. NPO. RA. NS @ 100 through PIV to RUE. Call bell and phone in reach.    1100 - VSS, pain remains mild in RUQ and R shoulder. Cardio, lungs, GU WDL. Last BM yesterday AM, pt reports it was hard. Up to walk in room today. IS to 2000 with goal of 2250.    1300 - Pt has taken shower, washed with CHG prep.    1355 - Teeth brushed and pt voided. Ring removed and placed by pt in her purse. Heparin given per resident & OR. Pt to OR.    1810 - Pt back from PACU. VSS, sharp pain 3/10 in lateral RUQ. On 4L by NC. Pt is groggy with slow but appropriate responses. Surgical abd incision(s) with transverse and r-sided vertical gauze/medipore dressing in place, CDI. LR @ 100 started through PIV to RAC. Pt on clear liquids. Bed alarm on, call bell in reach.   Provider Notifications:        Rapid Response Notifications:  Mobility:      PMP Activity: Step 6 - Walks in Room (11/11/2019  3:00 AM)     Weight tracking:  Family Dynamic:   Last 3 Weights for the past 72 hrs (Last 3 readings):   Weight   11/10/19 1909 74.3 kg (163 lb 12.8 oz)             Last Bowel Movement   No data recorded

## 2019-11-11 NOTE — Progress Notes (Signed)
Chart reviewed    Imaging reviewed    Patient seen and examined    Plan lap chole today

## 2019-11-11 NOTE — Transfer of Care (Signed)
Anesthesia Transfer of Care Note    Patient: Peggy Hernandez    Last vitals:   Vitals:    11/11/19 1635   BP: 115/74   Pulse: 77   Resp: 14   Temp: 36.6 C (97.9 F)   SpO2: 100%       Oxygen: Non-rebreather Mask     Mental Status:awake    Airway: Natural    Cardiovascular Status:  stable

## 2019-11-11 NOTE — Anesthesia Postprocedure Evaluation (Signed)
Anesthesia Post Evaluation    Patient: Peggy Hernandez    Procedure(s):  LAPAROTOMY, CHOLECYSTECTOMY  LAPAROSCOPIC CHOLECYSTECTOMY CONVERSION    Anesthesia type: general    Last Vitals:   Vitals Value Taken Time   BP 115/78 11/11/19 1745   Temp 36.6 C (97.9 F) 11/11/19 1635   Pulse 55 11/11/19 1745   Resp 13 11/11/19 1730   SpO2 97 % 11/11/19 1745                 Anesthesia Post Evaluation:     Patient Evaluated: PACU  Patient Participation: complete - patient participated  Level of Consciousness: awake and alert  Pain Score: 0  Pain Management: adequate  Multimodal analgesia pain management approach    Airway Patency: patent    Anesthetic complications: No      PONV Status: none    Cardiovascular status: acceptable  Respiratory status: acceptable  Hydration status: acceptable        Signed by: Geoffery Lyons, MD, 11/11/2019 6:44 PM

## 2019-11-11 NOTE — Plan of Care (Signed)
NURSE NOTE SUMMARY  [facility] - 4TH SURGICAL   Patient Name: Jonathon Bellows   Attending Physician: Charisse Klinefelter, MD   Today's date:   11/11/2019 LOS: 1 days   Shift Summary:                                                              2200: Pt arrived unit in a stable condition via wheelchair transport. Is alert and verbally responsive to care. Skin intact, IV access to right upper arm. Vision adequate with glasses. Scare noted to left knee, LUA and abdomen. Denies any abd pain.   IV NS @ 100 infusing to left upper arm. Skin intact. Is NPO after midnight for surgical procedure.Pt is aware,    IV zosyn well tolerated, no adverse reactions noted. SCDs to BLE, IS encouraged. Pt slept through shift. No c/o verbalized     Provider Notifications:        Rapid Response Notifications:  Mobility:      PMP Activity: Step 6 - Walks in Room (11/11/2019  3:00 AM)     Weight tracking:  Family Dynamic:   Last 3 Weights for the past 72 hrs (Last 3 readings):   Weight   11/10/19 1909 74.3 kg (163 lb 12.8 oz)             Last Bowel Movement   No data recorded        Problem: Altered GI Function  Goal: Fluid and electrolyte balance are achieved/maintained  Outcome: Progressing  Goal: Elimination patterns are normal or improving  Outcome: Progressing  Goal: Nutritional intake is adequate  Outcome: Progressing  Goal: Mobility/Activity is maintained at optimal level for patient  Outcome: Progressing     Problem: Pain interferes with ability to perform ADL  Goal: Pain at adequate level as identified by patient  Outcome: Progressing     Problem: Venous Thrombotic Event  Goal: Knowledge deficit/VTE Prevention  Outcome: Progressing

## 2019-11-12 ENCOUNTER — Encounter: Payer: Self-pay | Admitting: Surgery

## 2019-11-12 DIAGNOSIS — K810 Acute cholecystitis: Secondary | ICD-10-CM

## 2019-11-12 LAB — ECG 12-LEAD
P Wave Axis: 43 deg
P-R Interval: 159 ms
Patient Age: 63 years
Q-T Interval(Corrected): 437 ms
Q-T Interval: 420 ms
QRS Axis: 7 deg
QRS Duration: 98 ms
T Axis: 16 years
Ventricular Rate: 65 //min

## 2019-11-12 LAB — MAGNESIUM: Magnesium: 2.1 mg/dL (ref 1.6–2.6)

## 2019-11-12 LAB — BASIC METABOLIC PANEL
Anion Gap: 11.8 mMol/L (ref 7.0–18.0)
BUN / Creatinine Ratio: 21.1 Ratio (ref 10.0–30.0)
BUN: 16 mg/dL (ref 7–22)
CO2: 22.4 mMol/L (ref 20.0–30.0)
Calcium: 8.9 mg/dL (ref 8.5–10.5)
Chloride: 106 mMol/L (ref 98–110)
Creatinine: 0.76 mg/dL (ref 0.60–1.20)
EGFR: 85 mL/min/{1.73_m2} (ref 60–150)
Glucose: 118 mg/dL — ABNORMAL HIGH (ref 71–99)
Osmolality Calculated: 274 mOsm/kg — ABNORMAL LOW (ref 275–300)
Potassium: 4.2 mMol/L (ref 3.5–5.3)
Sodium: 136 mMol/L (ref 136–147)

## 2019-11-12 LAB — CBC AND DIFFERENTIAL
Basophils %: 0.1 % (ref 0.0–3.0)
Basophils Absolute: 0 10*3/uL (ref 0.0–0.3)
Eosinophils %: 0.1 % (ref 0.0–7.0)
Eosinophils Absolute: 0 10*3/uL (ref 0.0–0.8)
Hematocrit: 35.9 % — ABNORMAL LOW (ref 36.0–48.0)
Hemoglobin: 11.9 gm/dL — ABNORMAL LOW (ref 12.0–16.0)
Lymphocytes Absolute: 3 10*3/uL (ref 0.6–5.1)
Lymphocytes: 14.1 % — ABNORMAL LOW (ref 15.0–46.0)
MCH: 33 pg (ref 28–35)
MCHC: 33 gm/dL (ref 32–36)
MCV: 99 fL (ref 80–100)
MPV: 9.3 fL (ref 6.0–10.0)
Monocytes Absolute: 1.7 10*3/uL (ref 0.1–1.7)
Monocytes: 8 % (ref 3.0–15.0)
Neutrophils %: 77.8 % (ref 42.0–78.0)
Neutrophils Absolute: 16.7 10*3/uL — ABNORMAL HIGH (ref 1.7–8.6)
PLT CT: 280 10*3/uL (ref 130–440)
RBC: 3.62 10*6/uL — ABNORMAL LOW (ref 3.80–5.00)
RDW: 11.9 % (ref 11.0–14.0)
WBC: 21.4 10*3/uL — ABNORMAL HIGH (ref 4.0–11.0)

## 2019-11-12 LAB — PHOSPHORUS: Phosphorus: 3.8 mg/dL (ref 2.3–4.7)

## 2019-11-12 MED ORDER — SENNOSIDES-DOCUSATE SODIUM 8.6-50 MG PO TABS
2.0000 | ORAL_TABLET | Freq: Every evening | ORAL | Status: DC
Start: 2019-11-12 — End: 2019-11-13
  Administered 2019-11-12: 21:00:00 2 via ORAL
  Filled 2019-11-12: qty 2

## 2019-11-12 MED ORDER — FAMOTIDINE 20 MG PO TABS
20.0000 mg | ORAL_TABLET | Freq: Two times a day (BID) | ORAL | Status: DC
Start: 2019-11-12 — End: 2019-11-15
  Administered 2019-11-12 – 2019-11-15 (×6): 20 mg via ORAL
  Filled 2019-11-12 (×6): qty 1

## 2019-11-12 MED ORDER — DOCUSATE SODIUM 100 MG PO CAPS
100.0000 mg | ORAL_CAPSULE | Freq: Every day | ORAL | Status: DC
Start: 2019-11-12 — End: 2019-11-15
  Administered 2019-11-12 – 2019-11-15 (×4): 100 mg via ORAL
  Filled 2019-11-12 (×4): qty 1

## 2019-11-12 MED ORDER — OXYCODONE HCL 5 MG PO TABS
5.0000 mg | ORAL_TABLET | ORAL | Status: DC | PRN
Start: 2019-11-12 — End: 2019-11-15
  Administered 2019-11-12: 5 mg via ORAL
  Filled 2019-11-12: qty 1

## 2019-11-12 MED ORDER — VH GOLIVE PROBIOTIC PACKET
45.0000 | PACK | Freq: Every day | ORAL | Status: DC
Start: 2019-11-12 — End: 2019-11-15
  Administered 2019-11-13 – 2019-11-15 (×3): 45 via ORAL
  Filled 2019-11-12 (×4): qty 3

## 2019-11-12 NOTE — Nursing Progress Note (Signed)
NURSE NOTE SUMMARY  [facility] - 4TH SURGICAL   Patient Name: Peggy Hernandez   Attending Physician: Charisse Klinefelter, MD   Today's date:   11/12/2019 LOS: 2 days   Shift Summary:                                                                Patient received in care at 1900. Patient alert and oriented x4. Surgical incision to abdomen remains clean, dry, and intact. IV antibiotics continue as scheduled. Patient's pain managed with scheduled Tylenol and PRN morphine per MAR indications. Patient voiding WNL. Uneventful shift.   Provider Notifications:        Rapid Response Notifications:  Mobility:      PMP Activity: Step 7 - Walks out of Room (11/11/2019 10:00 AM)     Weight tracking:  Family Dynamic:   Last 3 Weights for the past 72 hrs (Last 3 readings):   Weight   11/10/19 1909 74.3 kg (163 lb 12.8 oz)             Last Bowel Movement   Last BM Date: 11/10/19

## 2019-11-12 NOTE — Plan of Care (Addendum)
NURSE NOTE SUMMARY  [facility] - 4TH SURGICAL   Patient Name: Peggy Hernandez   Attending Physician: Rea College, MD   Today's date:   11/12/2019 LOS: 2 days   Shift Summary:                                                              1900: Assumed pt care - pt alert and resting well in bed.   Pt alert and oriented x4, VSS, IVF infusing per order.  Pt reported mild - moderate pain at incision site - managed with scheduled tylenol.   Pt's abd dressing intact - small shadowing noted.  Pt independent in room - ambulated in halls. Will continue to monitor.     Provider Notifications:        Rapid Response Notifications:  Mobility:      PMP Activity: Step 7 - Walks out of Room (11/12/2019  7:51 PM)     Weight tracking:  Family Dynamic:   Last 3 Weights for the past 72 hrs (Last 3 readings):   Weight   11/10/19 1909 74.3 kg (163 lb 12.8 oz)             Last Bowel Movement   Last BM Date: 11/10/19        Problem: Altered GI Function  Goal: Fluid and electrolyte balance are achieved/maintained  Outcome: Progressing  Goal: Elimination patterns are normal or improving  Outcome: Progressing  Goal: Nutritional intake is adequate  Outcome: Progressing  Goal: Mobility/Activity is maintained at optimal level for patient  Outcome: Progressing     Problem: Pain interferes with ability to perform ADL  Goal: Pain at adequate level as identified by patient  Outcome: Progressing     Problem: Venous Thrombotic Event  Goal: Knowledge deficit/VTE Prevention  Outcome: Progressing     Problem: Moderate/High Fall Risk Score >5  Goal: Patient will remain free of falls  Outcome: Progressing

## 2019-11-12 NOTE — Plan of Care (Signed)
Problem: Altered GI Function  Goal: Fluid and electrolyte balance are achieved/maintained  Outcome: Progressing  Goal: Elimination patterns are normal or improving  Outcome: Progressing  Goal: Mobility/Activity is maintained at optimal level for patient  Outcome: Progressing     Problem: Pain interferes with ability to perform ADL  Goal: Pain at adequate level as identified by patient  Outcome: Progressing     Problem: Venous Thrombotic Event  Goal: Knowledge deficit/VTE Prevention  Outcome: Progressing

## 2019-11-12 NOTE — Plan of Care (Signed)
Problem: Altered GI Function  Goal: Elimination patterns are normal or improving  Outcome: Progressing  Flowsheets (Taken 11/12/2019 0726)  Elimination patterns are normal or improving:   Report abnormal assessment to physician   Assess for normal bowel sounds   Monitor for abdominal distension   Assess for signs and symptoms of bleeding.  Report signs of bleeding to physician   Monitor for abdominal discomfort   Administer treatments as ordered   Assess for flatus   Reinforce education on foods that improve and complicate bowel movements and how activity and medications can affect bowel movements   Administer medications to improve bowel evacuation as prescribed   Encourage /perform oral hygiene as appropriate     Problem: Pain interferes with ability to perform ADL  Goal: Pain at adequate level as identified by patient  Outcome: Progressing  Flowsheets (Taken 11/11/2019 0722)  Pain at adequate level as identified by patient:   Identify patient comfort function goal   Assess for risk of opioid induced respiratory depression, including snoring/sleep apnea. Alert healthcare team of risk factors identified.   Assess pain on admission, during daily assessment and/or before any "as needed" intervention(s)   Reassess pain within 30-60 minutes of any procedure/intervention, per Pain Assessment, Intervention, Reassessment (AIR) Cycle   Evaluate if patient comfort function goal is met

## 2019-11-12 NOTE — Progress Notes (Signed)
DAILY PROGRESS NOTE - ACCESS   Name:  Peggy Hernandez, Peggy Hernandez     DOB:  [birth year]   MR#:  [id number]               ROOM: 455/455-A    DATE:  11/12/19      Principal Diagnosis:  Acute cholecystitis    Refer to below for diagnoses being addressed for this encounter       ASSESSMENT & PLAN:                                                              Hospital Day: 3    s/p lap to open Cholecystectomy-CH  CONDITION:  stable    Patient Active Hospital Problem List:   Acute cholecystitis (11/10/2019)    Assessment: s/p open chole    Plan: clear liquids, AROBF, SCDs, IS, pain control, daily dressing changes, monitor labs, due to splenectomy will need 4 days of antibiotics (Omnicef).     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  I agree with the history, exam, imaging and lab value interpretation, as well as the assessment and plan documented by Massie Kluver, NP in this note  Gabriel Cirri, MD [phone]  Phone [phone]      Incidental findings:  none  Additional Info:  Questions were answered.  Discussed with bedside RN    Subjective/Chief Complaint & ROS:   Burping often, no flatus, stomach is tight after breakfast    24-hour interval history:  No significant change    Meds:   acetaminophen, 1,000 mg, Oral, Q6H   Or  acetaminophen, 1,000 mg, Oral, Q6H  famotidine, 20 mg, Oral, Q12H [facility]  heparin (porcine), 5,000 Units, Subcutaneous, Q8H SCH  lactobacillus species, 45 Billion CFU, Oral, Daily  piperacillin-tazobactam, 3.375 g, Intravenous, Q6H  sodium chloride (PF), 3 mL, Intravenous, Q8H        Infusions:   lactated ringers 100 mL/hr (11/12/19 7846)        DVT Prophylaxis:  heparin SC  Foley:  No          GI Prophylaxis:  No            BM last 24 Hours:  No   Bowel Regimen:  No    Diet:  Diet clear liquid     I & O's:  Data reviewed UO - x1/x1  Pain: reasonably controlled  EXAM:   Vitals:  Blood pressure  150/65, pulse 60, temperature 97.9 F (36.6 C), temperature source Temporal, resp. rate 15, height 1.676 m (5\' 6" ), weight 74.3 kg (163 lb 12.8 oz), SpO2 97 %.   General:   well-nourished, in no apparent distress  Pulmonary:   lungs clear to ausculation, no rales/rhonchi/wheezes  Cardiac:   regular rate and rhythm without murmurs/rubs/gallops  Abdomen:   hypoactive bowel sounds, moderately  distended, incision is healing well without erythema or drainage and staples are present and intact  Neuro:   alert, oriented x 3, no gross focal neurologic deficits, moves all extremities well  Psych:   normal affect, insight good  Extremities:  no edema  HEENT:   normocephalic  Neck:   supple  Skin:   normothermic, warm    Lab Data Reviewed:  Yes - Increase in WBCs    Cultures:   10/23-COVID-negative    CHEM:   Recent Labs   Lab 11/12/19  0849 11/11/19  0441 11/10/19  1926   Glucose 118* 94 85   Sodium 136 138 138   Potassium 4.2 4.5 3.9   Chloride 106 109 105   CO2 22.4 25 25    BUN 16 14 17    Creatinine 0.76 0.70 0.65   Calcium 8.9 9.1 9.8   Magnesium 2.1  --   --    Phosphorus 3.8  --   --      CBC:     Recent Labs   Lab 11/12/19  0849 11/11/19  0441 11/10/19  1926   WBC 21.4* 12.0* 15.5*   Hemoglobin 11.9* 12.7 14.2   Hematocrit 35.9* 40.1 42.7   PLT CT 280 316 330   MCV 99 99 98     BANDS:      POCT:      LFTs:    Recent Labs   Lab 11/10/19  1926   ALT 18   AST (SGOT) 17   Alkaline Phosphatase 78   Bilirubin, Total 0.4   Albumin 4.2     COAG:      Lactate:        Radiology:   No results found.

## 2019-11-12 NOTE — UM Notes (Addendum)
[facility] Utilization Management Review Sheet    Facility :  [facility]    NAME: Peggy Hernandez  MR#: [id number]    DOB 1955/08/26        CSN#: [id number]    ROOM: 455/455-A AGE: 64 y.o.    ADMIT DATE AND TIME: 11/10/2019    2207        PATIENT CLASS: Inpatient       ATTENDING PHYSICIAN: Charisse Klinefelter, MD  PAYOR:Payor: OUT OF STATE BLUE CROSS / Plan: BCBS OUT OF STATE PPO / Product Type: BCBS /       AUTH #: [id number]    DIAGNOSIS:     ICD-10-CM    1. Acute cholecystitis  K81.0          C/o abd pain RUQ    N&V    Not able to tolerate food         11/10/19 1909 -- 98.4 F (36.9 C) Temporal 94 97 % -- 20 168/91(Abnormal)  -- -- -- -- 4           Abdomen: Soft, non distended, focally tender right upper quadrant, positive Murphy sign, well-healed midline incision    Labs  Wbc  15.5  H&H  14  And  42   Glucose  85    Alb   4.2    Lipase   29     sars cov   2  Neg         US Abdomen Limited Ruq  Result Date: 11/10/2019  1. Cholelithiasis with tenderness to scanning over the gallbladder may represent acute cholecystitis. No ductal dilatation or free fluid      IN ER Received  NS  1000 ml iv     Morphine   4 mg iv   zofran   4 mg  Iv   toradol   15 mg  Iv   Zosyn  3.375  Gm iv         IMPRESSION:   64 y.o. female  with Acute cholecystitis.  Patient symptoms and physical exam, significant tenderness in right upper quadrant positive radiographic Murphy sign are consistent with acute cholecystitis, she does have leukocytosis and normal liver function tests.  I recommend admission to the hospital and proceeding with laparoscopic possible open cholecystectomy in a.m.    Additional Diagnoses being addressed this admission also include:      Active Hospital Problems    Diagnosis    Acute cholecystitis    History of splenectomy       PLAN:   Admit to surgery service  IV Zosyn  Lap chole possible open        11-10-2019 to medical floor vs  With continuous pulse ox  q  4 hours     I&O     NPO   NS  100 ml  hour iv continuous     tyl  1000 mg  Po q 6  Hours                  11/11/19 0803 -- 97 F (36.1 C) Temporal 67 95 % -- 17 105/73 -- -- Lying -- 3       Continues on  medical floor vs  With continuous pulse ox  q  4 hours     I&O    Clear liquids      NS  100 ml hour iv continuous   D/c   Start  LR  100 ml  hour iv continuous     tyl  1000 mg  Po q 6  Hours   pepcid  20 mg iv  q   12 hours   Heparin   5000 units  Sq q  8 hours     Lactobacillus  Po  Daily zosyn   3.375 gm  iv  q  6 hours   Fentanyl   25 mcg iv  Prn had x  2  morphine  2 mg  Iv   Prn had x   1  zofran  4 mg iv  Prn had x  1   Oxycodone  IR   5 mg  Po prn had x  1                  Labs  Wbc   12.0  H&H  12  And   40  Glucose   94       Date of Operation:   11/11/2019  Operative Procedure:   Laparoscopic converted to open cholecystectomy    Preoperative Diagnosis:   Pre-Op Diagnosis Codes:     * Acute cholecystitis [K81.0]    Postoperative Diagnosis:   Post-Op Diagnosis Codes:     * Acute cholecystitis [K81.0]            DATE OF REVIEW: 11/12/2019    VITALS: BP 150/65    Pulse 60    Temp 97.9 F (36.6 C) (Temporal)    Resp 15    Ht 1.676 m (5\' 6" )    Wt 74.3 kg (163 lb 12.8 oz)    SpO2 97%    BMI 26.44 kg/m  pain   2. To 5  / 10        Continues on  medical floor vs  With continuous pulse ox  q  4 hours     I&O    Clear liquids      LR  100 ml hour iv continuous     tyl  1000 mg  Po q 6  Hours   pepcid  20 mg iv  q   12 hours   Heparin   5000 units  Sq q  8 hours     Lactobacillus  Po  Daily zosyn   3.375 gm   iv  q  6 hours   zofran  4 mg iv  Prn had x   1      Oxycodone  IR  5 mg po prn had x   1                       Gavin Potters  Utilization Review  [facility]   Tel # [phone]  Fax #  [phone]

## 2019-11-12 NOTE — Progress Notes (Addendum)
NURSE NOTE SUMMARY  [facility] - 4TH SURGICAL   Patient Name: Peggy Hernandez   Attending Physician: Charisse Klinefelter, MD   Today's date:   11/12/2019 LOS: 2 days   Shift Summary:                                                              0102 - VSS, pain 2/10 in RUQ and posterior R shoulder when at rest, elevates to 5/10 when OOB. PRN 5 mg oxy given to encourage mobility. Plan for ambulation & IS discussed. Surgical dressing shows extensive serous shadowing, not requiring reinforcement at this time. Pt is very tender in RUQ with some localized swelling. Pt on clears, slight nausea; zofran given. Pt reports belching yesterday evening but that has subsided since being awake this morning. SCDs removed so pt could go to bathroom to void; no BM yet and not passing gas, but states that her baseline is usu ~2 BMs/week. Pt returned to chair.    EOS - Pain mostly mild this shift in lateral RUQ; no additional PRN pain or nausea medication needed (since 0815). Pt has been up to walk in hallway independently multiple times. Voiding WDL. Tolerating clears. Surgical dressing changed this morning by Herbert Seta, NP; serous shadowing on new dressing. Incision closed with staples was well-approximated. Bruising and swelling noted on abd around surgical site. LR @ 100 mL/hr continues. Pt denies further needs. Call bell in reach, report given to Naa, RN.     Provider Notifications:        Rapid Response Notifications:  Mobility:      PMP Activity: Step 6 - Walks in Room (11/11/2019 10:10 PM)     Weight tracking:  Family Dynamic:   Last 3 Weights for the past 72 hrs (Last 3 readings):   Weight   11/10/19 1909 74.3 kg (163 lb 12.8 oz)             Last Bowel Movement   Last BM Date: 11/10/19

## 2019-11-13 ENCOUNTER — Encounter: Payer: Self-pay | Admitting: Surgery

## 2019-11-13 LAB — CBC AND DIFFERENTIAL
Bands: 1 % (ref 0–10)
Basophils %: 0 % (ref 0.0–3.0)
Basophils Absolute: 0 10*3/uL (ref 0.0–0.3)
Eosinophils %: 0 % (ref 0.0–7.0)
Eosinophils Absolute: 0 10*3/uL (ref 0.0–0.8)
Hematocrit: 32.8 % — ABNORMAL LOW (ref 36.0–48.0)
Hemoglobin: 10.9 gm/dL — ABNORMAL LOW (ref 12.0–16.0)
Lymphocytes Absolute: 3.6 10*3/uL (ref 0.6–5.1)
Lymphocytes: 23 % (ref 15.0–46.0)
MCH: 33 pg (ref 28–35)
MCHC: 33 gm/dL (ref 32–36)
MCV: 100 fL (ref 80–100)
MPV: 9.3 fL (ref 6.0–10.0)
Monocytes Absolute: 0.6 10*3/uL (ref 0.1–1.7)
Monocytes: 4 % (ref 3.0–15.0)
Neutrophils %: 72 % (ref 42.0–78.0)
Neutrophils Absolute: 11.5 10*3/uL — ABNORMAL HIGH (ref 1.7–8.6)
PLT CT: 245 10*3/uL (ref 130–440)
RBC: 3.29 10*6/uL — ABNORMAL LOW (ref 3.80–5.00)
RDW: 12.1 % (ref 11.0–14.0)
WBC: 15.7 10*3/uL — ABNORMAL HIGH (ref 4.0–11.0)

## 2019-11-13 LAB — BASIC METABOLIC PANEL
Anion Gap: 10.7 mMol/L (ref 7.0–18.0)
BUN / Creatinine Ratio: 18.7 Ratio (ref 10.0–30.0)
BUN: 14 mg/dL (ref 7–22)
CO2: 25 mMol/L (ref 20–30)
Calcium: 8.7 mg/dL (ref 8.5–10.5)
Chloride: 108 mMol/L (ref 98–110)
Creatinine: 0.75 mg/dL (ref 0.60–1.20)
EGFR: 86 mL/min/{1.73_m2} (ref 60–150)
Glucose: 100 mg/dL — ABNORMAL HIGH (ref 71–99)
Osmolality Calculated: 280 mOsm/kg (ref 275–300)
Potassium: 3.7 mMol/L (ref 3.5–5.3)
Sodium: 140 mMol/L (ref 136–147)

## 2019-11-13 LAB — PHOSPHORUS: Phosphorus: 2.8 mg/dL (ref 2.3–4.7)

## 2019-11-13 LAB — MAGNESIUM: Magnesium: 2.3 mg/dL (ref 1.6–2.6)

## 2019-11-13 NOTE — Plan of Care (Addendum)
NURSE NOTE SUMMARY  [facility] - 4TH SURGICAL   Patient Name: Peggy Hernandez   Attending Physician: Rea College, MD   Today's date:   11/13/2019 LOS: 3 days   Shift Summary:                                                              0830 Pt resting in bed, getting ready for breakfast. Complains of 2/10 pain in abdomin. Incision dressing dry and intact. Minimal shadowing on dressing, bruising around sites. VS stable. Will continue to monitor. Call bell within reach.     1100  Pt pain remains 2/10 throughout whole abdomin, maintained with Tylenol PO. Ambulating frequently in the hall with no issues. Not passing gas at this time. Tolerating diet. Has not yet had a BM.    1800 Pt has not passed gas or had a BM. Pain still rated 2/10, covered with Tylenol. Pt frequently urinates and ambulates in the hall.      Provider Notifications:        Rapid Response Notifications:  Mobility:      PMP Activity: Step 7 - Walks out of Room (11/13/2019  8:30 AM)     Weight tracking:  Family Dynamic:   Last 3 Weights for the past 72 hrs (Last 3 readings):   Weight   11/10/19 1909 74.3 kg (163 lb 12.8 oz)             Last Bowel Movement   Last BM Date: 11/10/19

## 2019-11-13 NOTE — Progress Notes (Signed)
DAILY PROGRESS NOTE - ACCESS   Name:  Peggy Hernandez, Peggy Hernandez     DOB:  July 22, 1955   MR#:  [id number]               ROOM: 455/455-A    DATE:  11/13/19      Principal Diagnosis:  Acute cholecystitis    Refer to below for diagnoses being addressed for this encounter       ASSESSMENT & PLAN:                                                              Hospital Day: 4    s/p lap to open chole 10/24 CH    CONDITION:  unchanged    Patient Active Hospital Problem List:   Acute cholecystitis (11/10/2019)    Assessment: POD#2, painful, await return of bowel function    Plan: pain control, mobilize/up to chair.  Sips of clears for now. antibiotics     History of splenectomy (11/10/2019)    Assessment: noted    Plan: longer course of post op antibiotics     Await return of bowel function    Theotis Burrow, NP   X [phone]  [facility] ACCESS Program     Has some cramping feeling and sense that things are moving, remains distended. Await ROBF prior to advancing diet.  I agree with the history, exam, imaging and lab value interpretation, as well as the assessment and plan documented by Cristi Loron, NP in this note  Gabriel Cirri, MD [phone]  Phone [phone]      Incidental findings:  none  Additional Info:  Care plan and expectations reviewed with Patient  Discussed with bedside RN    Subjective/Chief Complaint & ROS:   Sitting up in chair, reports not feeling any better yet- complains of abdominal pain, bloating, intermittent abdominal cramping and right upper back/shoulder pain.  Tolerating small amount of clears, but feels full quickly.  No flatus or BM yet since surgery    24-hour interval history:  Bowel regimen started    Meds:   acetaminophen, 1,000 mg, Oral, Q6H   Or  acetaminophen, 1,000 mg, Oral, Q6H  docusate sodium, 100 mg, Oral, Daily  famotidine, 20 mg, Oral, Q12H SCH  heparin (porcine), 5,000 Units, Subcutaneous, Q8H SCH  lactobacillus species, 45 Billion CFU, Oral, Daily  piperacillin-tazobactam, 3.375 g, Intravenous,  Q6H  senna-docusate, 2 tablet, Oral, QHS  sodium chloride (PF), 3 mL, Intravenous, Q8H        Infusions:   lactated ringers 100 mL/hr (11/13/19 0133)        DVT Prophylaxis:  heparin SC  Foley:  No          GI Prophylaxis:  Yes            BM last 24 Hours:  No   Bowel Regimen:  Yes    Diet:  Diet clear liquid     I & O's:  Data reviewed UO - x7  Pain: reasonably controlled  EXAM:   Vitals:  Blood pressure 148/85, pulse 71, temperature 97.5 F (36.4 C), temperature source Temporal, resp. rate 17, height 1.676 m (5\' 6" ), weight 74.3 kg (163 lb 12.8 oz), SpO2 97 %.   General:   in no apparent distress, appears uncomfortable due  to pain, non-toxic  Pulmonary:   lungs clear to ausculation, equal breath sounds, decreased base(s) bilateral  Cardiac:   regular rate and rhythm  Abdomen:   hypoactive bowel sounds, soft, mild to moderate generalized and worse in RUQ tenderness without rebound and without guarding, moderately  distended, dressing is Dry and Intact  Neuro:   alert, speech normal, oriented x 3, no gross focal neurologic deficits, moves all extremities well  Psych:   normal affect, insight good, cooperative and calm  Extremities:   no edema  Skin:   no jaundice, normothermic    Lab Data Reviewed:  Yes - WBC improved, lytes ok    Cultures:  COVID negative     CHEM:   Recent Labs   Lab 11/13/19  0450 11/12/19  0849 11/11/19  0441 11/10/19  1926   Glucose 100* 118* 94 85   Sodium 140 136 138 138   Potassium 3.7 4.2 4.5 3.9   Chloride 108 106 109 105   CO2 25 22.4 25 25    BUN 14 16 14 17    Creatinine 0.75 0.76 0.70 0.65   Calcium 8.7 8.9 9.1 9.8   Magnesium 2.3 2.1  --   --    Phosphorus 2.8 3.8  --   --      CBC:     Recent Labs   Lab 11/13/19  0450 11/12/19  0849 11/11/19  0441 11/10/19  1926   WBC 15.7* 21.4* 12.0* 15.5*   Hemoglobin 10.9* 11.9* 12.7 14.2   Hematocrit 32.8* 35.9* 40.1 42.7   PLT CT 245 280 316 330   MCV 100 99 99 98     BANDS:    Recent Labs   Lab 11/13/19  0450   Bands 1     POCT:      LFTs:     Recent Labs   Lab 11/10/19  1926   ALT 18   AST (SGOT) 17   Alkaline Phosphatase 78   Bilirubin, Total 0.4   Albumin 4.2     COAG:      Lactate:        Radiology:   No results found.

## 2019-11-13 NOTE — Progress Notes (Signed)
Quick Doc  [facility] - 4TH SURGICAL   Patient Name: Peggy Hernandez   Attending Physician: Rea College, MD   Today's date:   11/13/2019 LOS: 3 days   Expected Discharge Date      Quick  Assessment:                                                              ReAdmit Risk Score: 13    CM Comments: (P) 10/26 RNCM DP: Electric Chart Review: Low Risk for Readmit: Adm w/Cholecystitis. S/P Chole. Amb. IS. CLD. AROBF. IVF. IVA. Has family. Insured. Indep/Works. No CM needs identified @ this time. Plan to d/c home w/family supp/trans. CM following    Physical Discharge Disposition: Home                                   Mode of Transportation: Car                                      Physical Discharge Disposition: Home       Provider Notifications:        Marita Snellen RN BSN  Case Manager 4 Surg  Ext. [phone]

## 2019-11-13 NOTE — UM Notes (Addendum)
CONTINUED STAY REVIEW 11/13/2019    AUTH#:[id number]      Peggy Hernandez  Jan 23, 1955  MRN: [id number]    11/12/19 08:26:35 97.9 F (36.6 C)  60 15 150/65 97 %     Continues on  LR  100 ml hour iv continuous      tyl  1000 mg po  q  6 hours      Colace  100 mg po daily     pepcid  10 mg po  q 12 hours   Heparin  5000 units  Sq q 8 hours  Lactobacillus  Po daily     Zosyn 3.375 gm  iv q  6 hours  pericolace   2 tabs po  q hs    zofran  4 mg iv prn had  X  1   Oxycodone  IR   5 mg po prn had x  1       Labs  Wbc  21.4  H&H   11 and   35   Glucose  118   Mag  2.1 phos  3.8            DATE:  11/12/19      Principal Diagnosis:  Acute cholecystitis    Refer to below for diagnoses being addressed for this encounter                                        ASSESSMENT & PLAN:                                                              Hospital Day: 3    s/p lap to open Cholecystectomy-CH  CONDITION:    stable    Patient Active Hospital Problem List:   Acute cholecystitis (11/10/2019)    Assessment: s/p open chole    Plan: clear liquids, AROBF, SCDs, IS, pain control, daily dressing changes, monitor labs, due to splenectomy will need 4 days of antibiotics (Omnicef).     History of splenectomy (11/10/2019)    Assessment: POA    Plan: supportive care         Doreene Burke, NP   X [phone]  [facility] ACCESS Program     Awaiting ROBF.  I agree with the history, exam, imaging and lab value interpretation, as well as the assessment and plan documented by Massie Kluver, NP in this note  Gabriel Cirri, MD [phone]  Phone [phone]                      MD NOTES:    DATE:  11/13/19      Principal Diagnosis:  Acute cholecystitis    Refer to below for diagnoses being addressed for this encounter                                        ASSESSMENT & PLAN:  Hospital Day: 4    s/p lap to open chole 10/24 CH    CONDITION:    unchanged    Patient Active Hospital Problem List:    Acute cholecystitis (11/10/2019)    Assessment: POD#2, painful, await return of bowel function    Plan: pain control, mobilize/up to chair.  Sips of clears for now. antibiotics     History of splenectomy (11/10/2019)    Assessment: noted    Plan: longer course of post op antibiotics     Await return of bowel function    Theotis Burrow, NP   X [phone]  [facility] ACCESS Program     Incidental findings:  none  Additional Info:  Care plan and expectations reviewed with Patient  Discussed with bedside RN    Subjective/Chief Complaint & ROS:   Sitting up in chair, reports not feeling any better yet- complains of abdominal pain, bloating, intermittent abdominal cramping and right upper back/shoulder pain.  Tolerating small amount of clears, but feels full quickly.  No flatus or BM yet since surgery                Recent Labs   Lab 11/13/19  0450   WBC 15.7*   Hemoglobin 10.9*   Hematocrit 32.8*   PLT CT 245   bands                 1        Recent Labs   Lab 11/13/19  0450   Sodium 140   Potassium 3.7   Chloride 108   CO2 25   BUN 14   Creatinine 0.75   EGFR 86   Glucose 100*   Calcium 8.7   mag                     2.3   PHos                  2.8        VITALS:    11/13/19 07:59:50 97.5 F (36.4 C)  71 17 148/85 97 %          Continues on medical floor  Vs   I&O  Clear liquid  Diet       IS            Scheduled Meds:  Current Facility-Administered Medications   Medication Dose Route Frequency    acetaminophen  1,000 mg Oral Q6H    Or    acetaminophen  1,000 mg Oral Q6H    docusate sodium  100 mg Oral Daily    famotidine  20 mg Oral Q12H SCH    heparin (porcine)  5,000 Units Subcutaneous Q8H SCH    lactobacillus species  45 Billion CFU Oral Daily    piperacillin-tazobactam  3.375 g Intravenous Q6H    sodium chloride (PF)  3 mL Intravenous Q8H     Continuous Infusions:   lactated ringers 100 mL/hr (11/13/19 0133)     PRN Meds:.naloxone, ondansetron **OR** ondansetron, oxyCODONE, prochlorperazine     zofran    4 mg iv  Prn had x  1                            Gavin Potters  Utilization Review  [facility]   Tel # [phone]  Fax #  [phone]

## 2019-11-14 LAB — CBC AND DIFFERENTIAL
Basophils %: 0 % (ref 0.0–3.0)
Basophils Absolute: 0 10*3/uL (ref 0.0–0.3)
Eosinophils %: 2 % (ref 0.0–7.0)
Eosinophils Absolute: 0.3 10*3/uL (ref 0.0–0.8)
Hematocrit: 35.4 % — ABNORMAL LOW (ref 36.0–48.0)
Hemoglobin: 11.2 gm/dL — ABNORMAL LOW (ref 12.0–16.0)
Lymphocytes Absolute: 2.8 10*3/uL (ref 0.6–5.1)
Lymphocytes: 17 % (ref 15.0–46.0)
MCH: 32 pg (ref 28–35)
MCHC: 32 gm/dL (ref 32–36)
MCV: 100 fL (ref 80–100)
MPV: 9.4 fL (ref 6.0–10.0)
Monocytes Absolute: 1 10*3/uL (ref 0.1–1.7)
Monocytes: 6 % (ref 3.0–15.0)
Neutrophils %: 75 % (ref 42.0–78.0)
Neutrophils Absolute: 12.3 10*3/uL — ABNORMAL HIGH (ref 1.7–8.6)
PLT CT: 256 10*3/uL (ref 130–440)
RBC: 3.55 10*6/uL — ABNORMAL LOW (ref 3.80–5.00)
RDW: 12.1 % (ref 11.0–14.0)
WBC: 16.4 10*3/uL — ABNORMAL HIGH (ref 4.0–11.0)

## 2019-11-14 LAB — PHOSPHORUS: Phosphorus: 3.5 mg/dL (ref 2.3–4.7)

## 2019-11-14 LAB — MAGNESIUM: Magnesium: 2.1 mg/dL (ref 1.6–2.6)

## 2019-11-14 NOTE — Progress Notes (Signed)
DAILY PROGRESS NOTE - ACCESS   Name:  Peggy Hernandez, Peggy Hernandez     DOB:  09/26/1955   MR#:  [id number]               ROOM: 455/455-A    DATE:  11/14/19      Principal Diagnosis:  Acute cholecystitis    Refer to below for diagnoses being addressed for this encounter       ASSESSMENT & PLAN:                                                              Hospital Day: 5    s/p lap to open chole 10/24 CH    CONDITION:  gradually improving    Patient Active Hospital Problem List:   Acute cholecystitis (11/10/2019)    Assessment: POD#3, +flatus, pain improved    Plan: pain control, mobilize/up to chair.  Full liquid diet. antibiotics     History of splenectomy (11/10/2019)    Assessment: noted    Plan: longer course of post op antibiotics    Full liquids  Bowel regimen  [place] home soon if tolerates diet and pain controlled    Theotis Burrow, NP   X [phone]  [facility] ACCESS Program     Began passing flatus, feels better. Still has leukocytosis, but no other evidence of infection. Final day of abx tomorrow, likely d/c today vs tomorrow after antibiotics.  I agree with the history, exam, imaging and lab value interpretation, as well as the assessment and plan documented by Cristi Loron, NP in this note  Gabriel Cirri, MD [phone]  Phone [phone]      Incidental findings:  none  Additional Info:  Care plan and expectations reviewed with Patient  Discussed with bedside RN    Subjective/Chief Complaint & ROS:   Up moving around in room, reports passing a lot of flatus last now.  Pain and abdominal cramping improved.  Tolerating sips of clears.  No BM yet    24-hour interval history:  +flatus    Meds:   acetaminophen, 1,000 mg, Oral, Q6H   Or  acetaminophen, 1,000 mg, Oral, Q6H  docusate sodium, 100 mg, Oral, Daily  famotidine, 20 mg, Oral, Q12H SCH  heparin (porcine), 5,000 Units, Subcutaneous, Q8H SCH  lactobacillus species, 45 Billion CFU, Oral, Daily  piperacillin-tazobactam, 3.375 g, Intravenous, Q6H  sodium chloride (PF), 3 mL,  Intravenous, Q8H        Infusions:       DVT Prophylaxis:  heparin SC  Foley:  No          GI Prophylaxis:  Yes            BM last 24 Hours:  No   Bowel Regimen:  Yes    Diet:  Diet full liquid     I & O's:  Data reviewed UO - and x13  Pain: reasonably controlled  EXAM:   Vitals:  Blood pressure 127/66, pulse 77, temperature 98.8 F (37.1 C), temperature source Temporal, resp. rate 18, height 1.676 m (5\' 6" ), weight 74.3 kg (163 lb 12.8 oz), SpO2 96 %.   General:   in no apparent distress, appears uncomfortable due to pain, non-toxic  Pulmonary:   lungs clear to ausculation, equal breath sounds, decreased  base(s) bilateral  Cardiac:   regular rate and rhythm  Abdomen:   hypoactive bowel sounds, soft, mild generalized and worse in RUQ tenderness without rebound and without guarding, mild to moderately distended, dressing is Dry and Intact  Neuro:   alert, speech normal, oriented x 3, no gross focal neurologic deficits, moves all extremities well  Psych:   normal affect, insight good, cooperative and calm  Extremities:   no edema  Skin:   no jaundice, normothermic    Lab Data Reviewed:  Yes - stable    Cultures:  COVID negative     CHEM:     Recent Labs   Lab 11/14/19  0510 11/13/19  0450 11/12/19  0849 11/11/19  0441 11/10/19  1926   Glucose  --  100* 118* 94 85   Sodium  --  140 136 138 138   Potassium  --  3.7 4.2 4.5 3.9   Chloride  --  108 106 109 105   CO2  --  25 22.4 25 25    BUN  --  14 16 14 17    Creatinine  --  0.75 0.76 0.70 0.65   Calcium  --  8.7 8.9 9.1 9.8   Magnesium 2.1 2.3 2.1  --   --    Phosphorus 3.5 2.8 3.8  --   --      CBC:       Recent Labs   Lab 11/14/19  0510 11/13/19  0450 11/12/19  0849 11/11/19  0441 11/10/19  1926   WBC 16.4* 15.7* 21.4* 12.0* 15.5*   Hemoglobin 11.2* 10.9* 11.9* 12.7 14.2   Hematocrit 35.4* 32.8* 35.9* 40.1 42.7   PLT CT 256 245 280 316 330   MCV 100 100 99 99 98     BANDS:    Recent Labs   Lab 11/13/19  0450   Bands 1     POCT:      LFTs:      Recent Labs   Lab  11/10/19  1926   ALT 18   AST (SGOT) 17   Alkaline Phosphatase 78   Bilirubin, Total 0.4   Albumin 4.2     COAG:      Lactate:        Radiology:   No results found.

## 2019-11-14 NOTE — Plan of Care (Addendum)
NURSE NOTE SUMMARY  [facility] - 4TH SURGICAL   Patient Name: Peggy Hernandez   Attending Physician: Rea College, MD   Today's date:   11/14/2019 LOS: 4 days   Shift Summary:                                                              1900: Assumed pt care. Pt resting well in bed. Call bell in reach.   Pt alert and oriented x4. VSS. IV c/d/i. Pt denies pain - just discomfort with movement. Pain managed with scheduled tylenol.    Pt ambulating in halls independently. Passing gas frequently. Tolerating diet and fluids well - no nausea or vomiting reported. Will continue to monitor.     0630: Right upper abd dressing changed - small drainage noted on the upper dressing. Some bruising noted as well. Staples in place and intact - pt denies any pain.       Provider Notifications:        Rapid Response Notifications:  Mobility:      PMP Activity: Step 7 - Walks out of Room (11/14/2019  8:56 PM)     Weight tracking:  Family Dynamic:   No data found.          Last Bowel Movement   Last BM Date: 11/10/19        Problem: Altered GI Function  Goal: Fluid and electrolyte balance are achieved/maintained  Outcome: Progressing  Goal: Elimination patterns are normal or improving  Outcome: Progressing  Goal: Nutritional intake is adequate  Outcome: Progressing  Goal: Mobility/Activity is maintained at optimal level for patient  Outcome: Progressing     Problem: Pain interferes with ability to perform ADL  Goal: Pain at adequate level as identified by patient  Outcome: Progressing     Problem: Venous Thrombotic Event  Goal: Knowledge deficit/VTE Prevention  Outcome: Progressing     Problem: Moderate/High Fall Risk Score >5  Goal: Patient will remain free of falls  Outcome: Progressing

## 2019-11-14 NOTE — Plan of Care (Addendum)
NURSE NOTE SUMMARY  [facility] - 4TH SURGICAL   Patient Name: Jonathon Bellows   Attending Physician: Rea College, MD   Today's date:   11/14/2019 LOS: 4 days   Shift Summary:                                                              1900: Assumed pt care - pt alert and resting well in bed.   Pt alert and oriented x4, VSS, IVF infusing per order.  Pt reported mild pain -  managed with scheduled tylenol.   Pt's abd dressing intact - small shadowing noted.  Pt independent in room - ambulated in halls. Will continue to monitor.    2300: Pt offered prune juice to help bowels move.     0130: Pt reported passing a lot of gas and feels much better. Pt reported some cramping as well.     1610: Pt denies pain and no more cramping. No further needs or Complaints.     Provider Notifications:        Rapid Response Notifications:  Mobility:      PMP Activity: Step 7 - Walks out of Room (11/13/2019  8:50 PM)     Weight tracking:  Family Dynamic:   No data found.          Last Bowel Movement   Last BM Date: 11/10/19        Problem: Altered GI Function  Goal: Fluid and electrolyte balance are achieved/maintained  Outcome: Progressing  Goal: Elimination patterns are normal or improving  Outcome: Progressing  Goal: Nutritional intake is adequate  Outcome: Progressing  Goal: Mobility/Activity is maintained at optimal level for patient  Outcome: Progressing     Problem: Pain interferes with ability to perform ADL  Goal: Pain at adequate level as identified by patient  Outcome: Progressing     Problem: Venous Thrombotic Event  Goal: Knowledge deficit/VTE Prevention  Outcome: Progressing     Problem: Moderate/High Fall Risk Score >5  Goal: Patient will remain free of falls  Outcome: Progressing

## 2019-11-14 NOTE — Plan of Care (Addendum)
NURSE NOTE SUMMARY  [facility] - 4TH SURGICAL   Patient Name: Peggy Hernandez   Attending Physician: Rea College, MD   Today's date:   11/14/2019 LOS: 4 days   Shift Summary:                                                              0800 Pt resting in bed. Mild discomfort when moving in abdomen. Passing gas frequently and says she is feeling much better. No other complaints at this time. Call bell within reach.     1000 MD advanced diet to full liquids. LR discontinued. Pt continues to ambulate frequently in the halls. Call bell within reach.    1830 Diet advanced to post op solids. Tolerated well. Pt passing gas, still no BM. Walking frequently in the hall. Pain maintained at a 2/10 with Tylenol.        Provider Notifications:        Rapid Response Notifications:  Mobility:      PMP Activity: Step 7 - Walks out of Room (11/14/2019  8:00 AM)     Weight tracking:  Family Dynamic:   No data found.          Last Bowel Movement   Last BM Date: 11/10/19       Problem: Altered GI Function  Goal: Fluid and electrolyte balance are achieved/maintained  Outcome: Progressing  Goal: Elimination patterns are normal or improving  Outcome: Progressing  Goal: Nutritional intake is adequate  Outcome: Progressing  Goal: Mobility/Activity is maintained at optimal level for patient  Outcome: Progressing     Problem: Pain interferes with ability to perform ADL  Goal: Pain at adequate level as identified by patient  Outcome: Progressing     Problem: Venous Thrombotic Event  Goal: Knowledge deficit/VTE Prevention  Outcome: Progressing     Problem: Moderate/High Fall Risk Score >5  Goal: Patient will remain free of falls  Outcome: Progressing

## 2019-11-15 ENCOUNTER — Encounter (INDEPENDENT_AMBULATORY_CARE_PROVIDER_SITE_OTHER): Payer: Self-pay | Admitting: Family

## 2019-11-15 DIAGNOSIS — Z9081 Acquired absence of spleen: Secondary | ICD-10-CM

## 2019-11-15 DIAGNOSIS — K8000 Calculus of gallbladder with acute cholecystitis without obstruction: Principal | ICD-10-CM

## 2019-11-15 LAB — CBC AND DIFFERENTIAL
Basophils %: 0 % (ref 0.0–3.0)
Basophils Absolute: 0 10*3/uL (ref 0.0–0.3)
Eosinophils %: 0 % (ref 0.0–7.0)
Eosinophils Absolute: 0 10*3/uL (ref 0.0–0.8)
Hematocrit: 36.4 % (ref 36.0–48.0)
Hemoglobin: 12 gm/dL (ref 12.0–16.0)
Lymphocytes Absolute: 3.8 10*3/uL (ref 0.6–5.1)
Lymphocytes: 26 % (ref 15.0–46.0)
MCH: 33 pg (ref 28–35)
MCHC: 33 gm/dL (ref 32–36)
MCV: 100 fL (ref 80–100)
MPV: 9.7 fL (ref 6.0–10.0)
Monocytes Absolute: 0.4 10*3/uL (ref 0.1–1.7)
Monocytes: 3 % (ref 3.0–15.0)
Neutrophils %: 71 % (ref 42.0–78.0)
Neutrophils Absolute: 10.5 10*3/uL — ABNORMAL HIGH (ref 1.7–8.6)
PLT CT: 267 10*3/uL (ref 130–440)
RBC: 3.62 10*6/uL — ABNORMAL LOW (ref 3.80–5.00)
RDW: 12.2 % (ref 11.0–14.0)
WBC: 14.8 10*3/uL — ABNORMAL HIGH (ref 4.0–11.0)

## 2019-11-15 LAB — PHOSPHORUS: Phosphorus: 3.8 mg/dL (ref 2.3–4.7)

## 2019-11-15 LAB — MAGNESIUM: Magnesium: 2.2 mg/dL (ref 1.6–2.6)

## 2019-11-15 MED ORDER — DSS 100 MG PO CAPS
100.0000 mg | ORAL_CAPSULE | Freq: Every day | ORAL | Status: DC
Start: 2019-11-16 — End: 2020-02-15

## 2019-11-15 MED ORDER — ACETAMINOPHEN 500 MG PO TABS
1000.0000 mg | ORAL_TABLET | Freq: Four times a day (QID) | ORAL | Status: AC
Start: 2019-11-15 — End: ?

## 2019-11-15 NOTE — Progress Notes (Signed)
DAILY PROGRESS NOTE - ACCESS   Name:  Peggy Hernandez, Peggy Hernandez     DOB:  1955/07/04   MR#:  [id number]               ROOM: 455/455-A    DATE:  11/15/19      Principal Diagnosis:  Acute cholecystitis    Refer to below for diagnoses being addressed for this encounter       ASSESSMENT & PLAN:                                                              Hospital Day: 6    s/p lap to open Cholecystectomy-CH  CONDITION:  stable    Patient Active Hospital Problem List:   Acute cholecystitis (11/10/2019)    Assessment: s/p open chole    Plan:  Post op solids, pain control (only taking Tylenol), staples to be removed in office after discharge, IS, SCDs, discharge home today, abx completed     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  Discussed with bedside RN    Subjective/Chief Complaint & ROS:   No complaints, feels ready to go home    24-hour interval history:  No significant change    Meds:   acetaminophen, 1,000 mg, Oral, Q6H   Or  acetaminophen, 1,000 mg, Oral, Q6H  docusate sodium, 100 mg, Oral, Daily  famotidine, 20 mg, Oral, Q12H SCH  heparin (porcine), 5,000 Units, Subcutaneous, Q8H SCH  lactobacillus species, 45 Billion CFU, Oral, Daily  piperacillin-tazobactam, 3.375 g, Intravenous, Q6H  sodium chloride (PF), 3 mL, Intravenous, Q8H        Infusions:       DVT Prophylaxis:  heparin SC  Foley:  No          GI Prophylaxis:  No            BM last 24 Hours:  No   Bowel Regimen:  No    Diet:  Diet post-op solids     I & O's:  Data reviewed UO - 1400/300, BM - 0  Pain: reasonably controlled  EXAM:   Vitals:  Blood pressure 144/82, pulse 78, temperature 98.2 F (36.8 C), temperature source Temporal, resp. rate 18, height 1.676 m (5\' 6" ), weight 74.3 kg (163 lb 12.8 oz), SpO2 94 %.   General:   well-nourished, in no apparent distress  Pulmonary:   lungs  clear to ausculation, no rales/rhonchi/wheezes  Cardiac:   regular rate and rhythm without murmurs/rubs/gallops  Abdomen:   hypoactive bowel sounds, moderately  distended, incision is healing well without erythema or drainage and staples are present and intact  Neuro:   alert, oriented x 3, no gross focal neurologic deficits, moves all extremities well  Psych:   normal affect, insight good  Extremities:   no edema  HEENT:   normocephalic  Neck:   supple  Skin:   normothermic, warm    Lab Data Reviewed:  Yes -     Cultures:   10/23-COVID-negative    CHEM:  Recent Labs   Lab 11/15/19  0503 11/14/19  0510 11/13/19  0450 11/12/19  0849 11/11/19  0441 11/10/19  1926   Glucose  --   --  100* 118* 94 85   Sodium  --   --  140 136 138 138   Potassium  --   --  3.7 4.2 4.5 3.9   Chloride  --   --  108 106 109 105   CO2  --   --  25 22.4 25 25    BUN  --   --  14 16 14 17    Creatinine  --   --  0.75 0.76 0.70 0.65   Calcium  --   --  8.7 8.9 9.1 9.8   Magnesium 2.2 2.1 2.3 2.1  --   --    Phosphorus 3.8 3.5 2.8 3.8  --   --      CBC:       Recent Labs   Lab 11/15/19  0503 11/14/19  0510 11/13/19  0450 11/12/19  0849 11/11/19  0441   WBC 14.8* 16.4* 15.7* 21.4* 12.0*   Hemoglobin 12.0 11.2* 10.9* 11.9* 12.7   Hematocrit 36.4 35.4* 32.8* 35.9* 40.1   PLT CT 267 256 245 280 316   MCV 100 100 100 99 99     BANDS:    Recent Labs   Lab 11/13/19  0450   Bands 1     POCT:      LFTs:      Recent Labs   Lab 11/10/19  1926   ALT 18   AST (SGOT) 17   Alkaline Phosphatase 78   Bilirubin, Total 0.4   Albumin 4.2     COAG:      Lactate:        Radiology:   No results found.

## 2019-11-15 NOTE — Plan of Care (Addendum)
NURSE NOTE SUMMARY  [facility] - 4TH SURGICAL   Patient Name: Jonathon Bellows   Attending Physician: Rea College, MD   Today's date:   11/15/2019 LOS: 5 days   Shift Summary:                                                              0700: Assumed care of patient.  0820: VSS. A/O x4. Shift assessment performed. Pt eating at the side of the bed. Will continue to monitor. Pt up independently. Mild complaints of abdominal pain, tylenol requested. Sill no BM, MD aware.     Provider Notifications:        Rapid Response Notifications:  Mobility:      PMP Activity: Step 7 - Walks out of Room (11/14/2019  8:56 PM)     Weight tracking:  Family Dynamic:   No data found.          Last Bowel Movement   Last BM Date: 11/10/19        Problem: Altered GI Function  Goal: Fluid and electrolyte balance are achieved/maintained  Outcome: Progressing  Goal: Elimination patterns are normal or improving  Outcome: Progressing  Goal: Nutritional intake is adequate  Outcome: Progressing  Goal: Mobility/Activity is maintained at optimal level for patient  Outcome: Progressing     Problem: Pain interferes with ability to perform ADL  Goal: Pain at adequate level as identified by patient  Outcome: Progressing     Problem: Venous Thrombotic Event  Goal: Knowledge deficit/VTE Prevention  Outcome: Progressing     Problem: Moderate/High Fall Risk Score >5  Goal: Patient will remain free of falls  Outcome: Progressing

## 2019-11-15 NOTE — Discharge Instr - AVS First Page (Signed)
Diet:  Resume previous home diet as tolerated.  No restrictions.    Activity:  No strenuous activity or lifting greater than 10-15 pounds x 4 weeks.  No working or driving while on narcotic pain medications.    Medications:  Refer to the After Visit Summary for updates and new medications.  Take over-the-counter  Tylenol (aka acetaminophen) and Advil/Motrin (aka ibuprofen) or other NSAIDs as needed for mild to moderate pain or headache.  Use over-the-counter stool softeners and/or Senokot daily to twice daily as needed for constipation.    Wound/Drain Care:  It is OK to shower and pat dry.  Staples will be removed in office at follow up appointment    Instructions:  Call/Return if temperature over 101.0, redness or purulent (pus) drainage or warmth to your incision(s), refractory nausea/vomiting, increased pain not relieved by medications, or new onset of chest pain or shortness or breath.  Continue to use your incentive spirometer at home at least 10 times every hour while awake.    For additional questions or concerns after your discharge, you may contact the ACCESS office at [phone] between the hours of 8:00am - 4:30pm.  Any medication refill requests should be called to the office during daytime business hours.  If you have an urgent need that occurs after office hours, call [phone] and ask to speak to the ACCESS physician on call.  No medication refills will be handled outside of business hours.  Emergencies should go to the Emergency Room and cannot be handled over the phone.    ACCESS BOWEL MAINTENANCE GUIDELINES    The use of narcotic pain medications and decreased activity will lead to constipation.  In order to prevent this from happening to you, please follow these recommendations:  Drink adequate amounts of fluid throughout the day.    Take an over-the-counter stool softener, such as Colace, once or twice a day depending on the firmness of your stools.  You may take another over-the-counter  stool softener, such as Senokot or generic Senna, two tablets at bedtime.  Add Metamucil or other similar fiber supplement to your diet twice a day.    If constipation persists:   Add Milk of Magnesia two (2) tablespoons (30mL) every 6    hours (up to 8 doses).  If you are experiencing rectal fullness or pressure, try a Dulcolax and/or   glycerin suppository every 12 hours.    Avoid anti-diarrhea medications once your bowels begin to move.  Loose         stools should resolve on their own after 2-3 days.  Gas relief medications such as Gas-X or Gaviscon are acceptable for use.    Probiotic food products, such as yogurt, are acceptable especially if you are or have been on antibiotics.    Additional Recipe for Success (Natural Stool Softener):    -1 cup of unsweetened applesauce,   - cup of unsweetened prune juice,   -1 cup of unprocessed bran (ex., Millers brand - can be found at  [employer], [place],        [employer], or other health food stores).    Mix together and store in refrigerator.    Take 2 tablespoons with a large glass of water daily and expect results 6-8 hours later.  You may add 1 tablespoon every 5-7 days if needed.   *can be used as long as desired, non-addictive, & gentle

## 2019-11-15 NOTE — Discharge Summary (Signed)
DISCHARGE SUMMARY - ACCESS   Name:  Peggy, Hernandez     DOB:  Dec 22, 1955     MR#:  [id number]         Admit Date:  11/10/2019 TO D/C Date: 11/15/19 [facility] Day: 6 )    Admit to:  ACCESS  Discharge from:  ACCESS     Discharge Diagnoses:     Active Hospital Problems    Diagnosis POA    Principal Problem: Calculus of gallbladder with acute cholecystitis without obstruction Yes    History of splenectomy Not Applicable      Resolved Hospital Problems   No resolved problems to display.        PMH:   has a past medical history of Shingles.     Brief Presentation History:  Refer to admission H&P for complete admission details.    63 y.o. female presents with a complaint of abdominal pain nausea and vomiting.  The patient has had symptoms of intermittent right upper quadrant pain nausea and vomiting for a few months, had a moderately severe attack about 6 days ago and another severe attack over the past 24 hours that brought her into the emergency department, she has not been able to tolerate food and has been having frequent nausea and vomiting, has constant pain in the right upper quadrant, no fever no chills, her pain is worse after eating, mostly happens in the morning, is crampy in nature, nonradiating, improved after she received some pain medications in the emergency department    Initial evaluation included an ultrasound to determine the diagnosis of cholelithiasis-acute cholecystitis      Hospital Course:    The patient was admitted to the surgical floor and placed on iv antibiotics, iv fluids, and prn pain/nausea medications.   On HD #2, she went to the operating room and underwent laparoscopic converted to open cholecystectomy.  Postoperatively, her diet was re-advanced over course of 3 days. She was continued on antibiotics d/t splenectomy and necrosis of gallbladder. She tolerated this well and was also able to be transitioned to oral analgesics.  She was ambulating well and was discharged on POD #4to Home.        Operative and Other Procedures:   Procedure(s):  OPEN CHOLECYSTECTOMY with LAPAROSCOPIC CHOLECYSTECTOMY CONVERSION - Dr. Corrie Dandy on 11/11/2019    Cultures/Pathology:   -ACUTE CHOLECYSTITIS.   -CHOLELITHIASIS.   -CHOLESTEROLOSIS.    Consultations:  None    Condition:  Good    Discharge Meds:     Current Discharge Medication List      START taking these medications    Details   acetaminophen (TYLENOL) 500 MG tablet Take 2 tablets (1,000 mg total) by mouth every 6 (six) hours      docusate sodium (COLACE) 100 MG capsule Take 1 capsule (100 mg total) by mouth daily         CONTINUE these medications which have NOT CHANGED    Details   aspirin EC 81 MG EC tablet Take 81 mg by mouth daily      Calcium Carbonate-Vitamin D (CALTRATE 600+D PO) Take by mouth             Follow-up:   [facility] in 10 days.   None    Future Appointments   Date Time Provider Department Center   11/27/2019  1:25 PM ACCESS SURG PROV1 [facility] ACCSUR Thamas Jaegers C       Patient Instructions:  Diet:  Resume previous home diet as tolerated.  No restrictions.    Activity:  No strenuous activity or lifting greater than 10-15 pounds x 4 weeks.  No working or driving while on narcotic pain medications.    Medications:  Refer to the After Visit Summary for updates and new medications.  Take over-the-counter  Tylenol (aka acetaminophen) and Advil/Motrin (aka ibuprofen) or other NSAIDs as needed for mild to moderate pain or headache.  Use over-the-counter stool softeners and/or Senokot daily to twice daily as needed for constipation.    Wound/Drain Care:  It is OK to shower and pat dry.  Staples will be removed in office at follow up appointment    Instructions:  Call/Return if temperature over 101.0, redness or purulent (pus) drainage or warmth to your incision(s), refractory nausea/vomiting, increased pain not relieved by medications, or new onset of chest pain or shortness or breath.  Continue to use your incentive  spirometer at home at least 10 times every hour while awake.    For additional questions or concerns after your discharge, you may contact the ACCESS office at [phone] between the hours of 8:00am - 4:30pm.  Any medication refill requests should be called to the office during daytime business hours.  If you have an urgent need that occurs after office hours, call [phone] and ask to speak to the ACCESS physician on call.  No medication refills will be handled outside of business hours.  Emergencies should go to the Emergency Room and cannot be handled over the phone.    Doreene Burke, NP   [facility] ACCESS Program     The time spent to complete this discharge and involving patient care today took less than 30 minutes.    Agree as noted.  Solon Palm, MD

## 2019-11-15 NOTE — Progress Notes (Signed)
Discharge order received. Discharge paper work reviewed with pt. Pt had no other questions at this time. Recommended to pick up over the counter tylenol and stool softeners. Peripheral IV removed. Wheelchair called for.

## 2019-11-15 NOTE — UM Notes (Addendum)
[facility] Utilization Management Review Sheet    Facility :  [facility]    NAME: Peggy Hernandez  MR#: [id number]    DOB 07-06-1955        CSN#: [id number]    ROOM: 455/455-A AGE: 64 y.o.    ADMIT DATE AND TIME: 11/10/2019  7:21 PM      PATIENT CLASS: Inpatient       ATTENDING PHYSICIAN: Mayuiers, Kermit Balo, MD  PAYOR:Payor: OUT OF STATE BLUE CROSS / Plan: BCBS OUT OF STATE PPO / Product Type: BCBS /       AUTH #: [id number]    DIAGNOSIS:     ICD-10-CM    1. Acute cholecystitis  K81.0              CM Comments: (P) 10/26 RNCM DP: Electric Chart Review: Low Risk for Readmit: Adm w/Cholecystitis. S/P Chole. Amb. IS. CLD. AROBF. IVF. IVA. Has family. Insured. Indep/Works. No CM needs identified @ this time. Plan to d/c home w/family supp/trans. CM following    Physical Discharge Disposition: Home  Mode of Transportation: Car  Physical Discharge Disposition: Home       Provider Notifications:        Marita Snellen RN BSN  Case Manager 4 Surg  Ext. [phone]                            11/14/19 07:30:27 97.5 F (36.4 C)  72 19 152/87 92 %          Continues on  LR  100 ml hour  Iv continuous  D/c  At  0944       tyl 1000 mg  Po q  6 hours     Colace  100 mg po daily  pepcid  20 mg po q  12 hours  Heparin  5000 units sq  8 hours    Lactobacillus  Po daily     Zosyn   3.375  Gm iv  q   6 hours             ASSESSMENT & PLAN:                                                              Hospital Day: 5    s/p lap to open chole 10/24 CH    CONDITION:    gradually improving    Patient Active Hospital Problem List:   Acute cholecystitis (11/10/2019)    Assessment: POD#3, +flatus, pain improved    Plan: pain control, mobilize/up to chair.  Full liquid diet. 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 Still has leukocytosis, but no other evidence of infection. Final day of abx tomorrow, likely d/c today vs tomorrow after antibiotics.  I agree with the history, exam, imaging and lab value interpretation, as well as the assessment and plan documented by Cristi Loron, NP in  this note  Gabriel Cirri, MD [phone]  Phone [phone]            DATE OF REVIEW: 11/15/2019    VITALS: BP 144/82    Pulse 78    Temp 98.2 F (36.8 C) (Temporal)    Resp 18    Ht 1.676 m (5\' 6" )    Wt 74.3 kg (163 lb 12.8 oz)    SpO2 94%    BMI 26.44 kg/m       D/c home today   11-15-2019 no needs             Gavin Potters  Utilization Review  [facility]   Tel # [phone]  Fax #  [phone]

## 2019-11-21 ENCOUNTER — Ambulatory Visit (INDEPENDENT_AMBULATORY_CARE_PROVIDER_SITE_OTHER): Payer: BC Managed Care – PPO | Admitting: Family

## 2019-11-21 ENCOUNTER — Telehealth (INDEPENDENT_AMBULATORY_CARE_PROVIDER_SITE_OTHER): Payer: Self-pay

## 2019-11-21 VITALS — BP 155/79 | HR 76 | Temp 97.5°F

## 2019-11-21 DIAGNOSIS — Z9049 Acquired absence of other specified parts of digestive tract: Secondary | ICD-10-CM

## 2019-11-21 NOTE — Patient Instructions (Signed)
Keep staple removal appointment  Stop Aspirin for now  Call office with if any questions or concerns

## 2019-11-21 NOTE — Progress Notes (Signed)
[facility] Clinic Note - General Postop    Patient Name: Peggy Hernandez     PCP: Pcp, None, MD   DOB: 1955-03-19        MRN#: [id number]    Date: 11/21/2019     History of Present Illness:   64 y.o. female returns for surgical follow-up.  She is s/p laparoscopic converted to open cholecystectomy for acute cholecystitis on 11/11/2019 by Dr. Corrie Dandy.   She returns prior to scheduled appointment with staple removal on 11/9/202.  She called office today complaining of bleeding from incision site (told MA saturated dressings and clothes).  Medications:     Outpatient Medications Marked as Taking for the 11/21/19 encounter (Office Visit) with Doreene Burke, NP   Medication Sig Dispense Refill    acetaminophen (TYLENOL) 500 MG tablet Take 2 tablets (1,000 mg total) by mouth every 6 (six) hours      aspirin EC 81 MG EC tablet Take 81 mg by mouth daily      Calcium Carbonate-Vitamin D (CALTRATE 600+D PO) Take by mouth      docusate sodium (COLACE) 100 MG capsule Take 1 capsule (100 mg total) by mouth daily         Physical Exam:   BP 155/79    Pulse 76    Temp 97.5 F (36.4 C)    SpO2 96%   General:  well-nourished, in no apparent distress  Pulm/Chest:  clear to auscultation bilaterally, equal breath sounds, no rales/rhonchi/wheezes  Cardiac:  regular rate and rhythm, no murmurs/rubs/gallops  Abdomen:  active bowel sounds, soft, mild to moderate incisional tenderness without guarding  Sutures/Staples:  staple are present.  small amount of dark red drainage    Pathology/Labs-Cultures/Radiology:   GALLBLADDER, CHOLECYSTECTOMY:   -ACUTE CHOLECYSTITIS.   -CHOLELITHIASIS.   -CHOLESTEROLOSIS.     Assessment:     1. S/P cholecystectomy       Postop Status:    Doing well postoperatively.    Plan:   Follow-up: No follow-ups on file.     Medication Orders:    Additional Orders:  No orders of the defined types were placed in this encounter.       Instructions:  Patient Instructions   Keep staple removal appointment  Stop Aspirin for  now  Call office with if any questions or concerns        Doreene Burke, NP  11/21/2019 2:46 PM    The supervising physician for this clinic visit was Dr. Marchia Bond.

## 2019-11-22 NOTE — Telephone Encounter (Signed)
error 

## 2019-11-27 ENCOUNTER — Ambulatory Visit (INDEPENDENT_AMBULATORY_CARE_PROVIDER_SITE_OTHER): Payer: BC Managed Care – PPO | Admitting: Family

## 2019-11-27 ENCOUNTER — Encounter (INDEPENDENT_AMBULATORY_CARE_PROVIDER_SITE_OTHER): Payer: Self-pay | Admitting: Family

## 2019-11-27 VITALS — BP 158/85 | HR 88 | Temp 97.0°F

## 2019-11-27 DIAGNOSIS — Z9049 Acquired absence of other specified parts of digestive tract: Secondary | ICD-10-CM

## 2019-11-27 NOTE — Progress Notes (Signed)
[facility] Clinic Note - General Postop    Patient Name: Peggy Hernandez     PCP: Pcp, None, MD   DOB: 02-27-1955        MRN#: [id number]    Date: 11/27/2019     History of Present Illness:   64 y.o. female returns for surgical follow-up.  She is s/p laparoscopic converted to open cholecystectomy for acute cholecystitis on 11/11/2019 by Dr. Corrie Dandy.   She returns prior to scheduled appointment with staple removal on 11/9/202.  She called office last visit  complaining of bleeding from incision site (told MA saturated dressings and clothes).      Today she presents for post op staple removal appointment, minimal bleeding from sites.  Medications:     Outpatient Medications Marked as Taking for the 11/27/19 encounter (Office Visit) with Doreene Burke, NP   Medication Sig Dispense Refill    acetaminophen (TYLENOL) 500 MG tablet Take 2 tablets (1,000 mg total) by mouth every 6 (six) hours      Calcium Carbonate-Vitamin D (CALTRATE 600+D PO) Take by mouth      docusate sodium (COLACE) 100 MG capsule Take 1 capsule (100 mg total) by mouth daily         Physical Exam:   BP 158/85    Pulse 88    Temp 97 F (36.1 C)    SpO2 100%   General:  well-nourished, in no apparent distress  Pulm/Chest:  clear to auscultation bilaterally, equal breath sounds, no rales/rhonchi/wheezes  Cardiac:  regular rate and rhythm, no murmurs/rubs/gallops  Abdomen:  active bowel sounds, soft, mild to moderate incisional tenderness without guarding  Sutures/Staples:  staple are present.  removed    Pathology/Labs-Cultures/Radiology:   GALLBLADDER, CHOLECYSTECTOMY:   -ACUTE CHOLECYSTITIS.   -CHOLELITHIASIS.   -CHOLESTEROLOSIS.     Assessment:     1. S/P cholecystectomy       Postop Status:    Doing well postoperatively.    Plan:   Follow-up: No follow-ups on file.     Medication Orders:    Additional Orders:  No orders of the defined types were placed in this encounter.       Instructions:  Patient Instructions   Continue with no heavy lifting over  10 pounds until follow up appointment  Stay off Aspirin for 1 more week   Follow up in 3 weeks, will discuss back to work date at that appointment        Doreene Burke, NP  11/27/2019 1:28 PM    The supervising physician for this clinic visit was Dr. Cecile Hearing.

## 2019-11-27 NOTE — Patient Instructions (Signed)
Continue with no heavy lifting over 10 pounds until follow up appointment  Stay off Aspirin for 1 more week   Follow up in 3 weeks, will discuss back to work date at that appointment

## 2019-11-30 ENCOUNTER — Ambulatory Visit (INDEPENDENT_AMBULATORY_CARE_PROVIDER_SITE_OTHER): Payer: BC Managed Care – PPO | Admitting: Physician Assistant

## 2019-11-30 VITALS — BP 156/86 | HR 77 | Temp 97.0°F

## 2019-11-30 DIAGNOSIS — Z9049 Acquired absence of other specified parts of digestive tract: Secondary | ICD-10-CM

## 2019-11-30 DIAGNOSIS — Z9889 Other specified postprocedural states: Secondary | ICD-10-CM

## 2019-11-30 DIAGNOSIS — Z5189 Encounter for other specified aftercare: Secondary | ICD-10-CM

## 2019-11-30 NOTE — Patient Instructions (Addendum)
You can shower and allow the soapy water to run over the area. Pack the area twice daily with wet to dry packing as instructed in the office.  Call with any questions or concerns.  You can continue to take at home medications for pain.    Watch out for signs of infection including increasing redness, puss like drainage, fever, etc.    Follow up as previously scheduled. Follow up with primary care physician.    Discontinue antibiotics and start a probiotic daily.

## 2019-11-30 NOTE — Progress Notes (Signed)
[facility] Note - General Postop    Patient Name: Peggy Hernandez     PCP: Santiago Bumpers, PA   DOB: May 06, 1955        MRN#: [id number]     Date: 11/30/2019     History of Present Illness:   64 y.o. female returns for surgical follow-up.  She is s/p laparoscopic converted to open cholecystectomy for acute cholecystitis on 11/11/2019 by Dr. Corrie Dandy.   She returns prior to scheduled appointment with staple removal on 11/9/202.  She denies fever, nausea, vomiting, shortness of breath.   Her appetite has been good.  Her activity has been limited due to post op restrictions. She was sent by PCP would check due to concern for infection.  PCP did place her on keflex which she has taken 2 doses of.    Medications:     Outpatient Medications Marked as Taking for the 11/30/19 encounter (Office Visit) with [facility]   Medication Sig Dispense Refill    acetaminophen (TYLENOL) 500 MG tablet Take 2 tablets (1,000 mg total) by mouth every 6 (six) hours      aspirin EC 81 MG EC tablet Take 81 mg by mouth daily      Calcium Carbonate-Vitamin D (CALTRATE 600+D PO) Take by mouth      docusate sodium (COLACE) 100 MG capsule Take 1 capsule (100 mg total) by mouth daily         Physical Exam:   BP 156/86    Pulse 77    Temp 97 F (36.1 C)    SpO2 96%   General:  in no apparent distress, appears comfortable, non-toxic  Pulm/Chest:  clear to auscultation bilaterally, equal breath sounds, no rales/rhonchi/wheezes  Cardiac:  regular rate and rhythm, no murmurs/rubs/gallops  Abdomen:  active bowel sounds, soft, non-tender, non-distended, incision is healing well, clean, dry, and intact. , no erythema and hematoma noted on the lateral aspect of the incision with small amount of bleeding. Hematoma was removed and site cleaned.  Wet to dry packing was applied.  No signs of infection.    Pathology/Labs-Cultures/Radiology:   No new    Assessment:     1. S/P cholecystectomy    2. Post-operative state    3. Visit for wound  check       Postop Status:    Doing well postoperatively.  No signs of infection noted once steri-strips removed and incision inspected.  Hematoma noted. Patient received instructed on how to pack wound.  Advised to discontinue abx and will follow up for wound check in 1 week.    Plan:   Follow-up: Return in about 6 days (around 12/06/2019) for wound check/post op appointment.     Medication Orders:    Additional Orders:  No orders of the defined types were placed in this encounter.       Instructions:  Patient Instructions   You can shower and allow the soapy water to run over the area. Pack the area twice daily with wet to dry packing as instructed in the office.  Call with any questions or concerns.  You can continue to take at home medications for pain.    Watch out for signs of infection including increasing redness, puss like drainage, fever, etc.    Follow up as previously scheduled. Follow up with primary care physician.    Discontinue antibiotics and start a probiotic daily.        Doy Mince Vishal Sandlin, PA  11/30/2019 1:27 PM  The supervising physician for this clinic visit was Dr. Reece Leader.

## 2019-12-06 ENCOUNTER — Ambulatory Visit (INDEPENDENT_AMBULATORY_CARE_PROVIDER_SITE_OTHER): Payer: BC Managed Care – PPO | Admitting: Physician Assistant

## 2019-12-06 VITALS — BP 147/83 | HR 82 | Temp 97.3°F

## 2019-12-06 DIAGNOSIS — Z9049 Acquired absence of other specified parts of digestive tract: Secondary | ICD-10-CM

## 2019-12-06 DIAGNOSIS — Z5189 Encounter for other specified aftercare: Secondary | ICD-10-CM

## 2019-12-06 NOTE — Patient Instructions (Addendum)
Continue daily wound care. Continue to take tylenol for pain. You can continue to take colace for constipation. Continue to watch for signs of infection. Call with any questions or concerns.

## 2019-12-06 NOTE — Progress Notes (Signed)
[facility] Note - General Postop    Patient Name: Peggy Hernandez     PCP: Santiago Bumpers, PA   DOB: 1955/07/05        MRN#: [id number]     Date: 12/06/2019     History of Present Illness:   64 y.o. female returns for surgical follow-up.  She is s/p laparoscopic converted to open cholecystectomy for acute cholecystitis on 11/11/2019 by Dr. Corrie Dandy.   She returns prior to scheduled appointment with staple removal on 11/9/202.  She denies fever, nausea, vomiting, shortness of breath.   Her appetite has been good.  Her activity has been limited due to post op restrictions. She was sent by PCP would check due to concern for infection.  PCP did place her on keflex which she has taken 2 doses of.    Medications:     Outpatient Medications Marked as Taking for the 12/06/19 encounter (Office Visit) with Burnis Kaser, Doy Mince, PA   Medication Sig Dispense Refill    acetaminophen (TYLENOL) 500 MG tablet Take 2 tablets (1,000 mg total) by mouth every 6 (six) hours      Calcium Carbonate-Vitamin D (CALTRATE 600+D PO) Take by mouth      docusate sodium (COLACE) 100 MG capsule Take 1 capsule (100 mg total) by mouth daily         Physical Exam:   BP 147/83    Pulse 82    Temp 97.3 F (36.3 C)    SpO2 96%   General:  in no apparent distress, appears comfortable, non-toxic  Pulm/Chest:  clear to auscultation bilaterally, equal breath sounds, no rales/rhonchi/wheezes  Cardiac:  regular rate and rhythm, no murmurs/rubs/gallops  Abdomen:  active bowel sounds, soft, non-tender, non-distended, incision is healing well, clean, dry, and intact. , no erythema and hematoma noted on the lateral aspect of the incision with small amount of bleeding. Hematoma was removed and site cleaned.  Wet to dry packing was applied.  No signs of infection.    Pathology/Labs-Cultures/Radiology:   No new    Assessment:     1. Visit for wound check    2. S/P cholecystectomy       Postop Status:    Doing well postoperatively.  No signs of infection noted once  steri-strips removed and incision inspected.  Hematoma noted. Patient received instructed on how to pack wound.  Advised to discontinue abx and will follow up for wound check in 1 week.    Plan:   Follow-up: Return in about 12 days (around 12/18/2019).     Medication Orders:    Additional Orders:  No orders of the defined types were placed in this encounter.       Instructions:  Patient Instructions   Continue daily wound care. Continue to take tylenol for pain. You can continue to take colace for constipation. Continue to watch for signs of infection. Call with any questions or concerns.        Doy Mince Capone Schwinn, PA  12/06/2019 1:28 PM    The supervising physician for this clinic visit was Dr. Cecile Hearing.

## 2019-12-18 ENCOUNTER — Ambulatory Visit (INDEPENDENT_AMBULATORY_CARE_PROVIDER_SITE_OTHER): Payer: BC Managed Care – PPO | Admitting: Physician Assistant

## 2019-12-18 VITALS — BP 170/87 | HR 77 | Temp 97.9°F

## 2019-12-18 DIAGNOSIS — Z9049 Acquired absence of other specified parts of digestive tract: Secondary | ICD-10-CM

## 2019-12-18 DIAGNOSIS — Z9889 Other specified postprocedural states: Secondary | ICD-10-CM

## 2019-12-18 NOTE — Progress Notes (Addendum)
[facility] Note - General Postop    Patient Name: Peggy Hernandez     PCP: Santiago Bumpers, PA   DOB: 05/07/1955        MRN#: [id number]     Date: 12/18/2019     History of Present Illness:   64 y.o. female returns for surgical follow-up.  She is s/p laparoscopic converted to open cholecystectomy for acute cholecystitis on 11/11/2019 by Dr. Corrie Dandy.   She returns prior to scheduled appointment with staple removal on 11/9/202.  She denies fever, nausea, vomiting, shortness of breath.   Her appetite has been good.  Her activity has been limited due to post op restrictions. Incision site appears to be healing well. Mild localized redness with mild warmth.  No increased abdominal pain.    Medications:     Outpatient Medications Marked as Taking for the 12/18/19 encounter (Office Visit) with Kaycie Pegues, Doy Mince, PA   Medication Sig Dispense Refill    Calcium Carbonate-Vitamin D (CALTRATE 600+D PO) Take by mouth         Physical Exam:   BP 170/87    Pulse 77    Temp 97.9 F (36.6 C)    SpO2 94%   General:  in no apparent distress, appears comfortable, non-toxic  Pulm/Chest:  clear to auscultation bilaterally, equal breath sounds, no rales/rhonchi/wheezes  Cardiac:  regular rate and rhythm, no murmurs/rubs/gallops  Abdomen:  active bowel sounds, soft, non-tender, non-distended, incision is healing well, clean, dry, and intact. Mild localized erythema and mild warmth. No signs of infection.     Pathology/Labs-Cultures/Radiology:   No new.    Assessment:     1. S/P cholecystectomy    2. Post-operative state       Postop Status:    Doing well postoperatively. Received a work note for return to work on 01/02/20.     Plan:   Follow-up: Return if symptoms worsen or fail to improve.     Medication Orders:    Additional Orders:  No orders of the defined types were placed in this encounter.       Instructions:  Patient Instructions   Continue low-residue diet. Call with any questions or concerns. Continue to gradually increase  activity at home.          Doy Mince Shadiyah Wernli, PA  12/18/2019 8:37 PM    The supervising physician for this clinic visit was Dr. Leavy Cella.    Discussed with Physician Assistant - doing well still, no apparent complications & wound healing well. RTC prn.   Solon Palm, MD

## 2019-12-18 NOTE — Patient Instructions (Addendum)
Continue low-residue diet. Call with any questions or concerns. Continue to gradually increase activity at home.

## 2019-12-19 DIAGNOSIS — I361 Nonrheumatic tricuspid (valve) insufficiency: Secondary | ICD-10-CM

## 2019-12-28 DIAGNOSIS — R531 Weakness: Secondary | ICD-10-CM | POA: Insufficient documentation

## 2020-01-08 IMAGING — CT CT CHEST W/O CM
2 of 4 series · 15 of 46 positions shown, 17 images · non-contrast
Comparison: Radiograph same day

CLINICAL DATA: Acute respiratory illness, possible mass on
radiograph

EXAM:
CT CHEST WITHOUT CONTRAST
TECHNIQUE: Multidetector CT imaging of the chest was performed following the
standard protocol without IV contrast.

[Series 3: cap without · axial · non-contrast · 0.95mm/px · z∈[+727,+1257]mm · 12 of 126 slices shown, 14 images]
[im 10/126  soft-tissue]
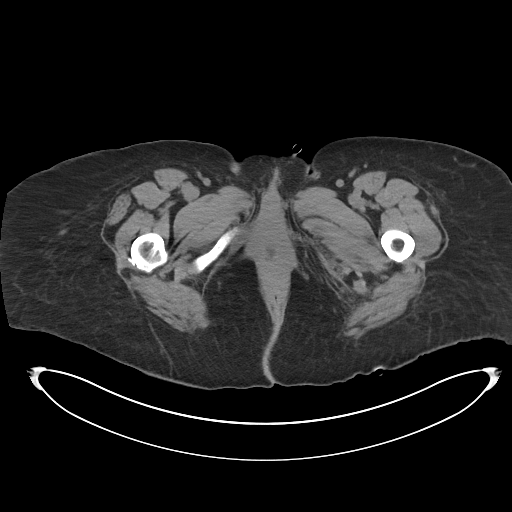
[im 10/126  bone]
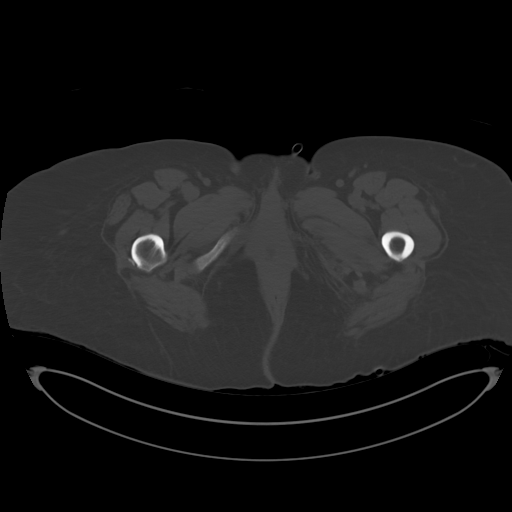
[im 20/126  soft-tissue]
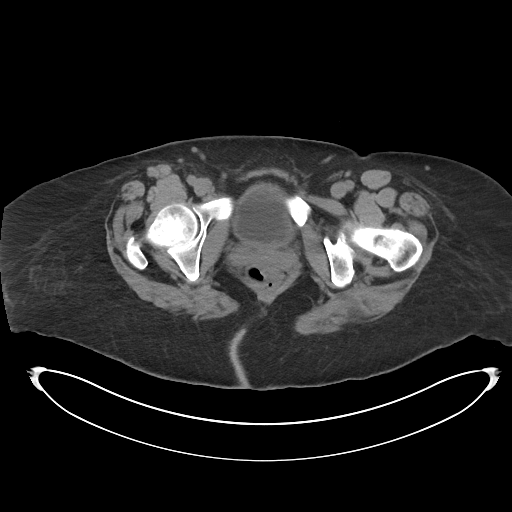
[im 29/126  soft-tissue]
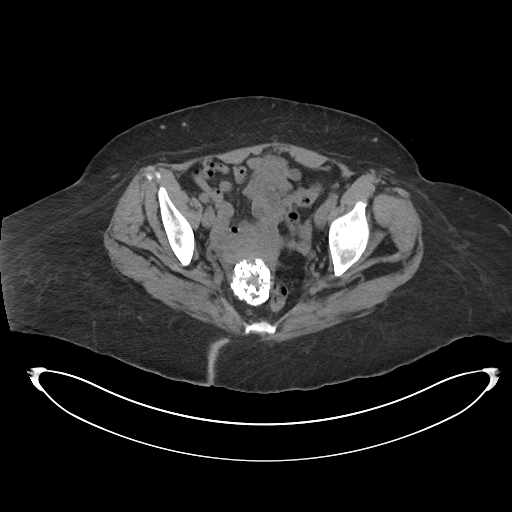
[im 39/126  soft-tissue]
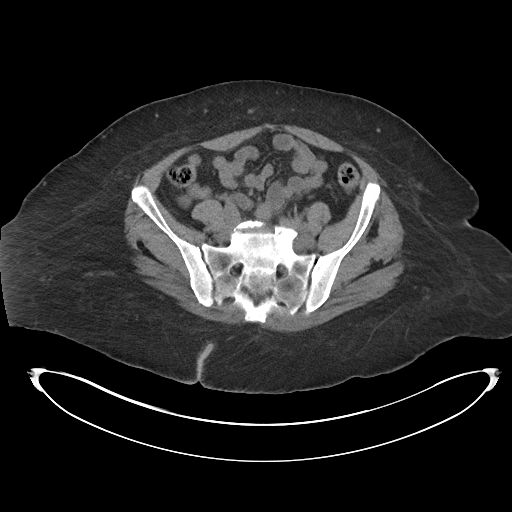
[im 49/126  soft-tissue]
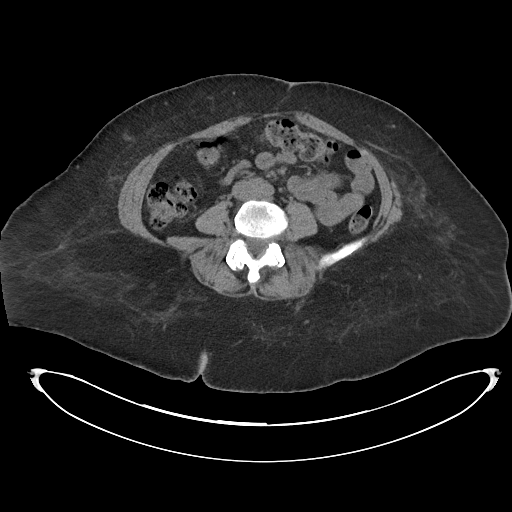
[im 58/126  soft-tissue]
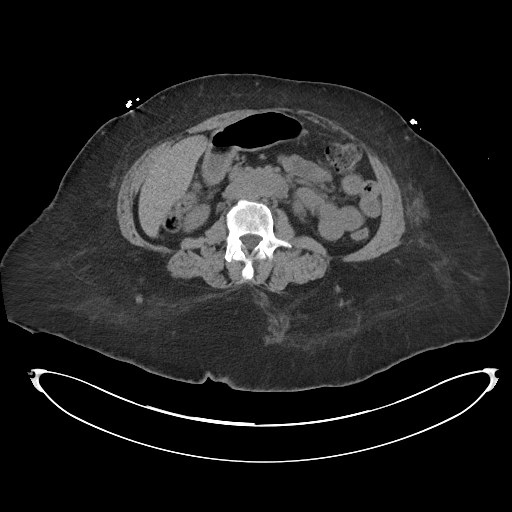
[im 68/126  soft-tissue]
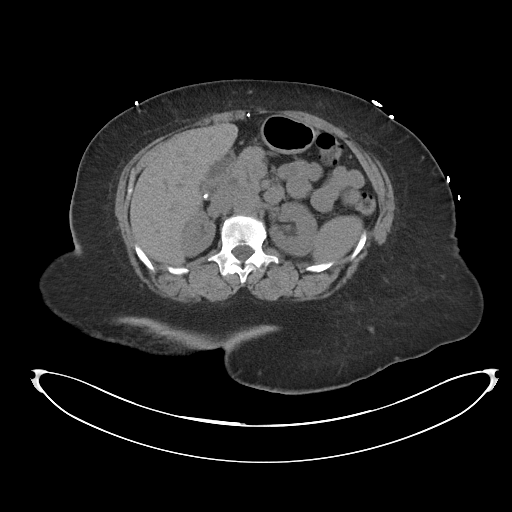
[im 77/126  soft-tissue]
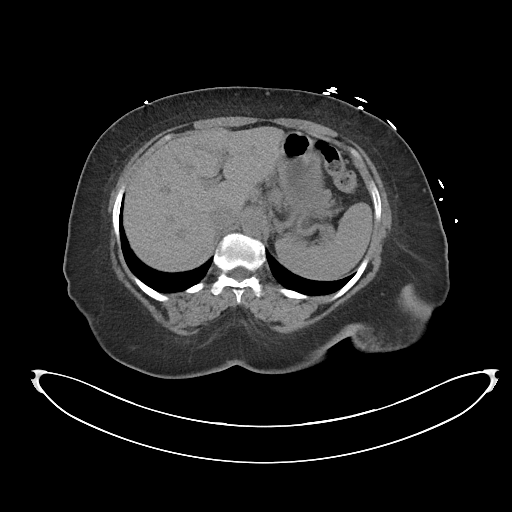
[im 87/126  soft-tissue]
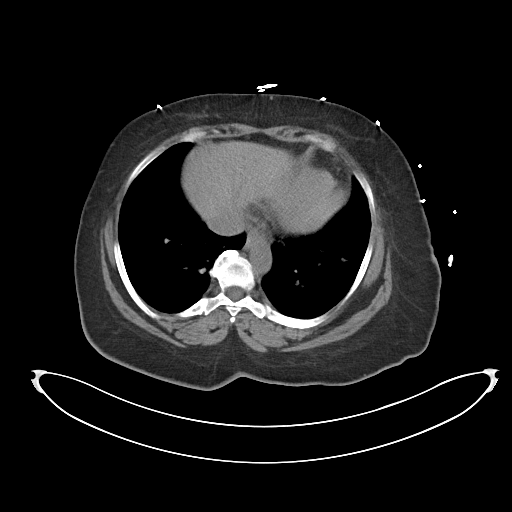
[im 87/126  bone]
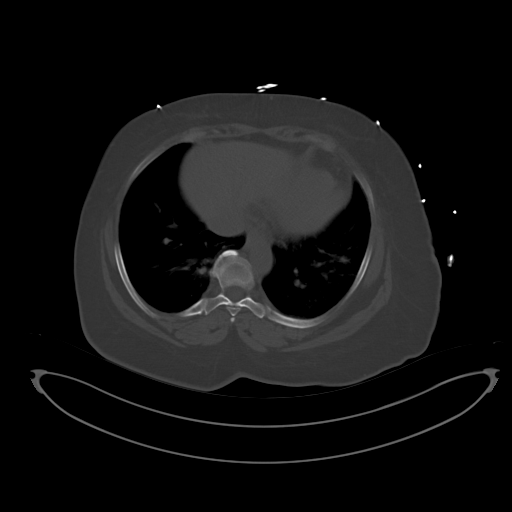
[im 97/126  soft-tissue]
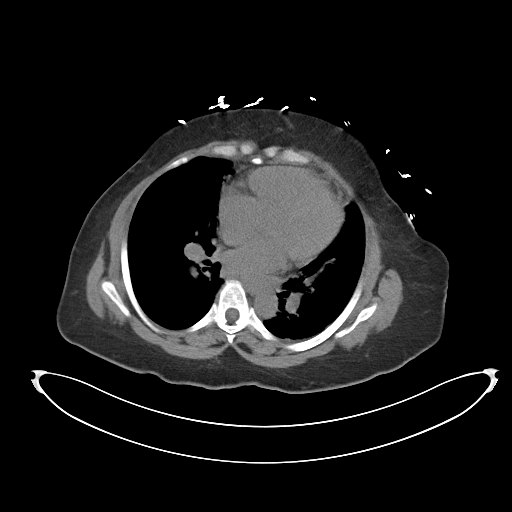
[im 106/126  soft-tissue]
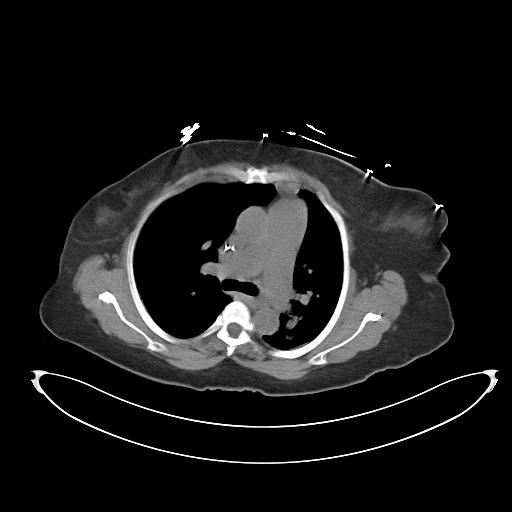
[im 116/126  soft-tissue]
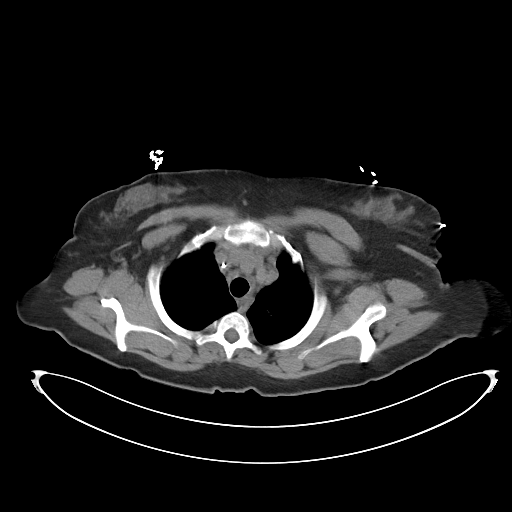

[Series 6: cor · coronal · 1.07mm/px · 3 of 106 slices shown]
[im 36/106  soft-tissue]
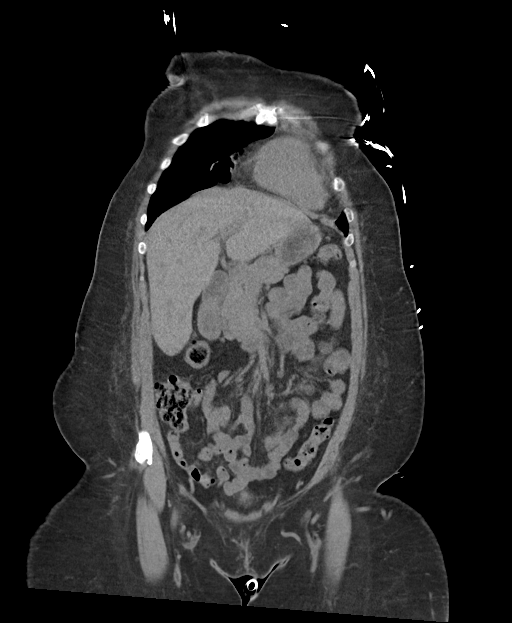
[im 47/106  soft-tissue]
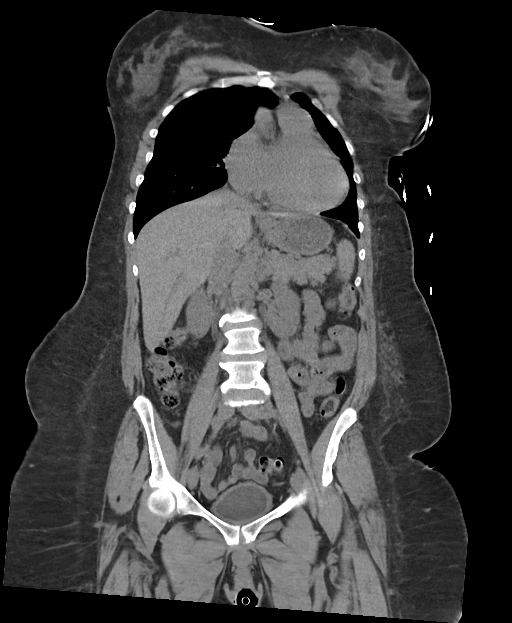
[im 59/106  soft-tissue]
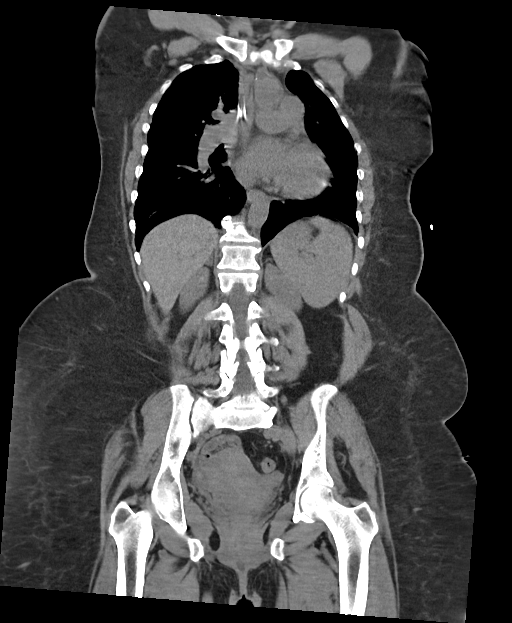

[15 of 46 positions shown; findings below may reference images not displayed]

FINDINGS: Cardiovascular: There is mild prominence of the main pulmonary
artery measuring roughly 3 cm at the level the bifurcation. Aortic
atherosclerotic calcifications seen at the aortic arch and
descending intrathoracic aorta. There is mild cardiomegaly. A
right-sided MediPort catheter seen with the tip at the superior
cavoatrial junction. No pericardial effusion.

Mediastinum/Nodes: Somewhat limited due to lack of intravenous
contrast, there appears to be a 2 cm left pretracheal lymph node
present. The trachea and esophagus are grossly unremarkable.

Lungs/Pleura: Biapical scarring is seen with subpleural blebs. There
is streaky/patchy airspace opacity within the left lower lung and
right lung base. No large airspace consolidation or pleural
effusion.

Upper abdomen: The visualized portion of the upper abdomen is
unremarkable.

Musculoskeletal/Chest wall: There is no chest wall mass or
suspicious osseous finding. No acute osseous abnormality

Lower chest: No hiatal hernia.

Hepatobiliary: Although limited due to the lack of intravenous
contrast, normal in appearance without gross focal abnormality. The
patient is status post cholecystectomy. No biliary ductal dilation.

Pancreas:  Unremarkable.  No surrounding inflammatory changes.

Spleen: Normal in size. Although limited due to the lack of
intravenous contrast, normal in appearance.

Adrenals/Urinary Tract: Both adrenal glands appear normal. The
kidneys and collecting system appear normal without evidence of
urinary tract calculus or hydronephrosis. The bladder is partially
decompressed with apparent mild wall thickening.

Stomach/Bowel: The stomach, small bowel, and colon are normal in
appearance. No inflammatory changes or obstructive findings.

Vascular/Lymphatic: There are no enlarged abdominal or pelvic lymph
nodes. No significant gross vascular findings are present.

Reproductive: Coarsely calcified exophytic uterine fibroids are
present.

Other: No evidence of abdominal wall mass or hernia.

Musculoskeletal: No acute or significant osseous findings.
Degenerative changes are seen in the thoracolumbar spine.
IMPRESSION: 1. Mildly prominent main pulmonary arteries which can be suggestive
of pulmonary artery hypertension.
2. Nonspecific mild patchy airspace opacity in the left lower lung
and right base which could be atelectasis and/or atypical infectious
etiology.
3. No acute intra-abdominal or pelvic pathology.

## 2020-01-28 ENCOUNTER — Encounter (INDEPENDENT_AMBULATORY_CARE_PROVIDER_SITE_OTHER): Payer: Self-pay | Admitting: Physician Assistant

## 2020-01-29 ENCOUNTER — Other Ambulatory Visit: Payer: Self-pay

## 2020-01-29 DIAGNOSIS — F209 Schizophrenia, unspecified: Secondary | ICD-10-CM | POA: Insufficient documentation

## 2020-01-29 DIAGNOSIS — I10 Essential (primary) hypertension: Secondary | ICD-10-CM | POA: Insufficient documentation

## 2020-01-29 DIAGNOSIS — F32A Depression, unspecified: Secondary | ICD-10-CM | POA: Insufficient documentation

## 2020-01-29 DIAGNOSIS — M1990 Unspecified osteoarthritis, unspecified site: Secondary | ICD-10-CM | POA: Insufficient documentation

## 2020-01-29 DIAGNOSIS — R569 Unspecified convulsions: Secondary | ICD-10-CM | POA: Insufficient documentation

## 2020-01-29 DIAGNOSIS — E039 Hypothyroidism, unspecified: Secondary | ICD-10-CM | POA: Insufficient documentation

## 2020-01-29 DIAGNOSIS — K219 Gastro-esophageal reflux disease without esophagitis: Secondary | ICD-10-CM | POA: Insufficient documentation

## 2020-01-29 DIAGNOSIS — N189 Chronic kidney disease, unspecified: Secondary | ICD-10-CM | POA: Insufficient documentation

## 2020-01-29 DIAGNOSIS — I82409 Acute embolism and thrombosis of unspecified deep veins of unspecified lower extremity: Secondary | ICD-10-CM | POA: Insufficient documentation

## 2020-01-31 ENCOUNTER — Other Ambulatory Visit: Payer: Self-pay

## 2020-01-31 ENCOUNTER — Ambulatory Visit (INDEPENDENT_AMBULATORY_CARE_PROVIDER_SITE_OTHER): Payer: Medicaid Other | Admitting: Cardiology

## 2020-01-31 ENCOUNTER — Encounter: Payer: Self-pay | Admitting: Cardiology

## 2020-01-31 VITALS — BP 138/84 | HR 63 | Ht 66.0 in | Wt 237.6 lb

## 2020-01-31 DIAGNOSIS — N184 Chronic kidney disease, stage 4 (severe): Secondary | ICD-10-CM | POA: Diagnosis not present

## 2020-01-31 DIAGNOSIS — E782 Mixed hyperlipidemia: Secondary | ICD-10-CM

## 2020-01-31 DIAGNOSIS — I272 Pulmonary hypertension, unspecified: Secondary | ICD-10-CM

## 2020-01-31 DIAGNOSIS — I10 Essential (primary) hypertension: Secondary | ICD-10-CM | POA: Diagnosis not present

## 2020-01-31 DIAGNOSIS — I5032 Chronic diastolic (congestive) heart failure: Secondary | ICD-10-CM

## 2020-01-31 NOTE — Progress Notes (Signed)
Cardiology Office Note:    Date:  01/31/2020   ID:  Kelsey Leonard, DOB [birth year], MRN [id number]  PCP:  Martinique, [name], MD  Cardiologist:  Berniece Salines, DO  Electrophysiologist:  None   Referring MD: Martinique, [name], MD   I am doing fine  History of Present Illness:    Kelsey Leonard is a 65 y.o. female with a hx of diastolic Heart Failure, Chronic Kidney Disease, DVT (on Eliquis), Hypertension, Hypothyroidism,. Patient Is a Poor Historian.  She usually is here with his Kelsey Leonard but today came by herself.  She offers no complaints at this time.  I last saw the patient in April 2021 at that time she appears to be doing well from a cardiovascular standpoint.    Today she denies any chest pain, shortness of breath, and lightheadedness or dizziness.  Since I last saw the patient in December she was seen at [facility] where she presented for weakness and psychosis she was treated for several days and discharged home.  She did have echocardiogram during that time which show normal EF 55 to 60%.  Left atrium was mildly dilated right ventricle was mildly enlarged diastolic dysfunction with indeterminate grade.  Right atrium is mild to moderate enlarged.  Trace tricuspid regurgitation with trace mild regurgitation.  Severe pulmonary artery hypertension.  Past Medical History:  Diagnosis Date  . Asthma   . CKD (chronic kidney disease)   . Depression   . DVT (deep venous thrombosis) ([place])   . 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 GERD (gastroesophageal reflux disease)   . Hypertension   . Hypothyroidism   . Low blood pressure 09/27/2018  . Osteoarthritis   . Schizophrenia ([place])   . Seizures ([place])   . Sepsis [facility])     Past Surgical History:  Procedure Laterality Date  . ABDOMINAL HYSTERECTOMY    . IR FLUORO GUIDE CV LINE RIGHT  08/24/2018  . IR REMOVAL TUN CV CATH W/O FL  09/29/2018  . IR US GUIDE VASC ACCESS RIGHT  08/24/2018  .  TEE WITHOUT CARDIOVERSION N/A 08/22/2018   Procedure: TRANSESOPHAGEAL ECHOCARDIOGRAM (TEE);  Surgeon: Lelon Perla, MD;  Location: [facility] ENDOSCOPY;  Service: Cardiovascular;  Laterality: N/A;  . THYROIDECTOMY, PARTIAL      Current Medications: Current Meds  Medication Sig  . acetaminophen (TYLENOL) 500 MG tablet Take 1,000 mg by mouth every 6 (six) hours as needed for mild pain or moderate pain.  Marland Kitchen albuterol (VENTOLIN HFA) 108 (90 Base) MCG/ACT inhaler Inhale 2 puffs into the lungs every 6 (six) hours as needed for wheezing or shortness of breath.  Marland Kitchen aspirin EC 81 MG tablet Take 81 mg by mouth daily.  . benztropine (COGENTIN) 1 MG tablet Take 1 mg by mouth 2 (two) times daily.  . clonazePAM (KLONOPIN) 0.5 MG tablet Take 0.5 mg by mouth daily.  Marland Kitchen ELIQUIS 2.5 MG TABS tablet TAKE ONE TABLET BY MOUTH TWICE A DAY (ROUND YELLOW TABLET WITH 893 2 1/2)  . FANAPT 6 MG TABS TAKE ONE-HALF (1/2) TABLET BY MOUTH EVERY MORNING AND ONE  and  ONE-HALF (1  and  1/2) TABLETS BY MOUTH EVERY EVENING (ROUND WHITE TABLET WI  . furosemide (LASIX) 40 MG tablet TAKE ONE TABLET BY MOUTH DAILY. (ROUND WHITE TABLET WITH PJ825)  . methocarbamol (ROBAXIN) 500 MG tablet Take 500 mg by mouth 3 (three) times daily.  . nortriptyline (PAMELOR) 25 MG capsule Take 25  mg by mouth 2 (two) times daily.  . potassium chloride SA (KLOR-CON) 20 MEQ tablet TAKE ONE TABLET BY MOUTH DAILY. (OBLONG WHITE TABLET WITH KD M20)  . pravastatin (PRAVACHOL) 40 MG tablet TAKE ONE TABLET BY MOUTH DAILY (ROUND GREEN TABLET WITH APO PRA40)  . sertraline (ZOLOFT) 50 MG tablet Take 50 mg by mouth at bedtime.  . solifenacin (VESICARE) 5 MG tablet Take 5 mg by mouth daily.  . traMADol (ULTRAM) 50 MG tablet Take 50 mg by mouth every 6 (six) hours as needed.     Allergies:   Aspirin, Penicillin g, Penicillins, Shellfish allergy, and Sulfa antibiotics   Social History   Socioeconomic History  . Marital status: Single    Spouse name: Not on file  .  Number of children: Not on file  . Years of education: Not on file  . Highest education level: Not on file  Occupational History  . Not on file  Tobacco Use  . Smoking status: Never Smoker  . Smokeless tobacco: Never Used  Vaping Use  . Vaping Use: Never used  Substance and Sexual Activity  . Alcohol use: Never  . Drug use: Never  . Sexual activity: Not Currently  Other Topics Concern  . Not on file  Social History Narrative  . Not on file   Social Determinants of Health   Financial Resource Strain: Not on file  Food Insecurity: Not on file  Transportation Needs: Not on file  Physical Activity: Not on file  Stress: Not on file  Social Connections: Not on file     Family History: The patient's family history includes Diabetes in her brother and sister; Hypertension in her brother, mother, and sister.  ROS:   Review of Systems  Constitution: Negative for decreased appetite, fever and weight gain.  HENT: Negative for congestion, ear discharge, hoarse voice and sore throat.   Eyes: Negative for discharge, redness, vision loss in right eye and visual halos.  Cardiovascular: Negative for chest pain, dyspnea on exertion, leg swelling, orthopnea and palpitations.  Respiratory: Negative for cough, hemoptysis, shortness of breath and snoring.   Endocrine: Negative for heat intolerance and polyphagia.  Hematologic/Lymphatic: Negative for bleeding problem. Does not bruise/bleed easily.  Skin: Negative for flushing, nail changes, rash and suspicious lesions.  Musculoskeletal: Negative for arthritis, joint pain, muscle cramps, myalgias, neck pain and stiffness.  Gastrointestinal: Negative for abdominal pain, bowel incontinence, diarrhea and excessive appetite.  Genitourinary: Negative for decreased libido, genital sores and incomplete emptying.  Neurological: Negative for brief paralysis, focal weakness, headaches and loss of balance.  Psychiatric/Behavioral: Negative for altered  mental status, depression and suicidal ideas.  Allergic/Immunologic: Negative for HIV exposure and persistent infections.    EKGs/Labs/Other Studies Reviewed:    The following studies were reviewed today:   EKG:  The ekg ordered today demonstrates  Sinus rhythm, heart rate 63 bpm no PVCs   ZIO monitor The patient wore the monitor for 14days starting 12/08/2018. Indication: Palpitations The minimum heart rate was 45bpm, maximum heart rate was 171bpm, and average HRwas 72bpm. Predominant underlying rhythm was Sinus Rhythm.  4 Ventricular Tachycardia runs occurred, the run with the fastest interval lasting 4 beats with a maximum heart rate of 162bpm, the longest lasting 19 beats with an average heart rate of 113 bpm.  4 Supraventricular Tachycardia runs occurred, the run with the fastest interval lasting 5 beats with a max rate of 171 bpm, the longest lasting 12.2 secs with an avg rate of 106 bpm.  Premature atrial complexes were rare (<1.0%). Premature Ventricular complexeswere were occasional (2.8%,40197). Ventricular Couplets were rare (<1.0%, 1233), and Ventricular Triplets were rare (<1.0%). Ventricular Bigeminy and Trigeminy were present.  No pauses, No AV block and no atrial fibrillation present. Nopatient triggered eventsor diary events are noted.  Conclusion: This study is remarkable for the following: 1. 4 episodes of nonsustained ventricular tachycardia. 2. 5 episodes of supraventricular tachycardia noted which is likely atrial tachycardia with variable block.   Recent Labs: 05/09/2019: BUN 42; Creatinine, Ser 2.88; Magnesium 2.2; Potassium 5.0; Sodium 144  Recent Lipid Panel    Component Value Date/Time   TRIG 49 08/13/2018 0347    Physical Exam:    VS:  BP 138/84   Pulse 63   Ht 5\' 6"  (1.676 m)   Wt 237 lb 9.6 oz (107.8 kg)   SpO2 93%   BMI 38.35 kg/m     Wt Readings from Last 3  Encounters:  01/31/20 237 lb 9.6 oz (107.8 kg)  05/09/19 227 lb (103 kg)  03/13/19 221 lb (100.2 kg)     GEN: Well nourished, well developed in no acute distress HEENT: Normal NECK: No JVD; No carotid bruits LYMPHATICS: No lymphadenopathy CARDIAC: S1S2 noted,RRR, no murmurs, rubs, gallops RESPIRATORY:  Clear to auscultation without rales, wheezing or rhonchi  ABDOMEN: Soft, non-tender, non-distended, +bowel sounds, no guarding. EXTREMITIES: No edema, No cyanosis, no clubbing MUSCULOSKELETAL:  No deformity  SKIN: Warm and dry NEUROLOGIC:  Alert and oriented x 3, non-focal PSYCHIATRIC:  Normal affect, good insight  ASSESSMENT:    1. Essential hypertension   2. CKD (chronic kidney disease), stage IV ([place])   3. Morbid obesity ([place])   4. Mixed hyperlipidemia   5. Chronic diastolic heart failure (HCC)   6. Pulmonary hypertension (HCC)    PLAN:     Her blood pressure is acceptable in the office today. I will continue her current regimen.   She appears to be Euvolemic - I will continue her current diuretic dose with potassium supplement.  Hyperlipidemia - continue her pravastatin   The patient understands the need to lose weight with diet and exercise. We have discussed specific strategies for this.  CKD avoid nephrotoxins  Blood work to be done today to assess creatinine and kidney function.  She does have severe pulmonary hypertension I will talk to the patient I like to refer her to [facility] through our advanced heart failure clinic she like to discuss with her sister [name] about this.  The patient is in agreement with the above plan. The patient left the office in stable condition.  The patient will follow up in 6 months or sooner if needed.   Medication Adjustments/Labs and Tests Ordered: Current medicines are reviewed at length with the patient today.  Concerns regarding medicines are outlined above.  Orders Placed This Encounter  Procedures  . Basic Metabolic  Panel (BMET)  . Magnesium  . EKG 12-Lead   No orders of the defined types were placed in this encounter.   Patient Instructions  Medication Instructions:  Your physician recommends that you continue on your current medications as directed. Please refer to the Current Medication list given to you today.  *If you need a refill on your cardiac medications before your next appointment, please call your pharmacy*   Lab Work: Your physician recommends that you return for lab work in:   BMET, Magnesium  If you have labs (blood work) drawn today and your tests are completely normal, you will  receive your results only by: Marland Kitchen MyChart Message (if you have MyChart) OR . A paper copy in the mail If you have any lab test that is abnormal or we need to change your treatment, we will call you to review the results.   Testing/Procedures: NONE   Follow-Up: At [facility], you and your health needs are our priority.  As part of our continuing mission to provide you with exceptional heart care, we have created designated Provider Care Teams.  These Care Teams include your primary Cardiologist (physician) and Advanced Practice Providers (APPs -  Physician Assistants and Nurse Practitioners) who all work together to provide you with the care you need, when you need it.  We recommend signing up for the patient portal called "MyChart".  Sign up information is provided on this After Visit Summary.  MyChart is used to connect with patients for Virtual Visits (Telemedicine).  Patients are able to view lab/test results, encounter notes, upcoming appointments, etc.  Non-urgent messages can be sent to your provider as well.   To learn more about what you can do with MyChart, go to [email or web address].    Your next appointment:   6 month(s)  The format for your next appointment:   In Person  Provider:   Berniece Salines, DO   Other Instructions      Adopting a Healthy Lifestyle.  Know what a  healthy weight is for you (roughly BMI <25) and aim to maintain this   Aim for 7+ servings of fruits and vegetables daily   65-80+ fluid ounces of water or unsweet tea for healthy kidneys   Limit to max 1 drink of alcohol per day; avoid smoking/tobacco   Limit animal fats in diet for cholesterol and heart health - choose grass fed whenever available   Avoid highly processed foods, and foods high in saturated/trans fats   Aim for low stress - take time to unwind and care for your mental health   Aim for 150 min of moderate intensity exercise weekly for heart health, and weights twice weekly for bone health   Aim for 7-9 hours of sleep daily   When it comes to diets, agreement about the perfect plan isnt easy to find, even among the experts. Experts at the [employer] developed an idea known as the Healthy Eating Plate. Just imagine a plate divided into logical, healthy portions.   The emphasis is on diet quality:   Load up on vegetables and fruits - one-half of your plate: Aim for color and variety, and remember that potatoes dont count.   Go for whole grains - one-quarter of your plate: Whole wheat, barley, wheat berries, quinoa, oats, brown rice, and foods made with them. If you want pasta, go with whole wheat pasta.   Protein power - one-quarter of your plate: Fish, chicken, beans, and nuts are all healthy, versatile protein sources. Limit red meat.   The diet, however, does go beyond the plate, offering a few other suggestions.   Use healthy plant oils, such as olive, canola, soy, corn, sunflower and peanut. Check the labels, and avoid partially hydrogenated oil, which have unhealthy trans fats.   If youre thirsty, drink water. Coffee and tea are good in moderation, but skip sugary drinks and limit milk and dairy products to one or two daily servings.   The type of carbohydrate in the diet is more important than the amount. Some sources of carbohydrates,  such as vegetables, fruits, whole grains,  and beans-are healthier than others.   Finally, stay active  Signed, Berniece Salines, DO  01/31/2020 3:08 PM    [facility]

## 2020-01-31 NOTE — Patient Instructions (Signed)
Medication Instructions:  Your physician recommends that you continue on your current medications as directed. Please refer to the Current Medication list given to you today.  *If you need a refill on your cardiac medications before your next appointment, please call your pharmacy*   Lab Work: Your physician recommends that you return for lab work in:   BMET, Magnesium  If you have labs (blood work) drawn today and your tests are completely normal, you will receive your results only by: Marland Kitchen MyChart Message (if you have MyChart) OR . A paper copy in the mail If you have any lab test that is abnormal or we need to change your treatment, we will call you to review the results.   Testing/Procedures: NONE   Follow-Up: At [facility], you and your health needs are our priority.  As part of our continuing mission to provide you with exceptional heart care, we have created designated Provider Care Teams.  These Care Teams include your primary Cardiologist (physician) and Advanced Practice Providers (APPs -  Physician Assistants and Nurse Practitioners) who all work together to provide you with the care you need, when you need it.  We recommend signing up for the patient portal called "MyChart".  Sign up information is provided on this After Visit Summary.  MyChart is used to connect with patients for Virtual Visits (Telemedicine).  Patients are able to view lab/test results, encounter notes, upcoming appointments, etc.  Non-urgent messages can be sent to your provider as well.   To learn more about what you can do with MyChart, go to [email or web address].    Your next appointment:   6 month(s)  The format for your next appointment:   In Person  Provider:   Berniece Salines, DO   Other Instructions

## 2020-02-01 LAB — BASIC METABOLIC PANEL
BUN/Creatinine Ratio: 14 (ref 12–28)
BUN: 35 mg/dL — ABNORMAL HIGH (ref 8–27)
CO2: 24 mmol/L (ref 20–29)
Calcium: 9.1 mg/dL (ref 8.7–10.3)
Chloride: 111 mmol/L — ABNORMAL HIGH (ref 96–106)
Creatinine, Ser: 2.52 mg/dL — ABNORMAL HIGH (ref 0.57–1.00)
GFR calc Af Amer: 23 mL/min/{1.73_m2} — ABNORMAL LOW (ref >59)
GFR calc non Af Amer: 20 mL/min/{1.73_m2} — ABNORMAL LOW (ref >59)
Glucose: 82 mg/dL (ref 65–99)
Potassium: 5.1 mmol/L (ref 3.5–5.2)
Sodium: 146 mmol/L — ABNORMAL HIGH (ref 134–144)

## 2020-02-01 LAB — MAGNESIUM: Magnesium: 2.2 mg/dL (ref 1.6–2.3)

## 2020-02-15 ENCOUNTER — Ambulatory Visit (INDEPENDENT_AMBULATORY_CARE_PROVIDER_SITE_OTHER): Payer: BC Managed Care – PPO | Admitting: Family

## 2020-02-15 VITALS — BP 151/86 | HR 72 | Temp 97.3°F

## 2020-02-15 DIAGNOSIS — Z9889 Other specified postprocedural states: Secondary | ICD-10-CM

## 2020-02-15 DIAGNOSIS — Z5189 Encounter for other specified aftercare: Secondary | ICD-10-CM

## 2020-02-15 DIAGNOSIS — Z9049 Acquired absence of other specified parts of digestive tract: Secondary | ICD-10-CM

## 2020-02-15 NOTE — Patient Instructions (Signed)
No follow up required  Call office if you have any questions or concerns

## 2020-02-15 NOTE — Progress Notes (Signed)
[facility] Note - General Postop    Patient Name: Peggy Hernandez     PCP: Santiago Bumpers, PA   DOB: 10-Feb-1955        MRN#: [id number]     Date: 02/15/2020     History of Present Illness:   65 y.o. female returns for surgical follow-up.  She is s/p laparoscopic converted to open cholecystectomy for acute cholecystitis on 11/11/2019 by Dr. Corrie Dandy.   She returns prior to scheduled appointment with staple removal on 11/27/2019.  She denies fever, nausea, vomiting, shortness of breath.   Her appetite has been good.  Her activity has been limited due to post op restrictions. She returned for wound check, No fevers/chills/nausea/vomiting    Medications:     Outpatient Medications Marked as Taking for the 02/15/20 encounter (Office Visit) with Doreene Burke, NP   Medication Sig Dispense Refill    aspirin EC 81 MG EC tablet Take 81 mg by mouth daily      Calcium Carbonate-Vitamin D (CALTRATE 600+D PO) Take by mouth         Physical Exam:   BP 151/86    Pulse 72    Temp 97.3 F (36.3 C)    SpO2 100%   General:  in no apparent distress, appears comfortable, non-toxic  Pulm/Chest:  clear to auscultation bilaterally, equal breath sounds, no rales/rhonchi/wheezes  Cardiac:  regular rate and rhythm, no murmurs/rubs/gallops  Abdomen:  active bowel sounds, soft, non-tender, non-distended, incision is healing well, clean, dry, and intact. , no erythema and hematoma noted on the lateral aspect of the incision scabbed over with some fluctuation. No signs of infection.    Pathology/Labs-Cultures/Radiology:   None    Assessment:     1. Post-operative state    2. S/P cholecystectomy    3. Visit for wound check       Postop Status:    Doing well postoperatively.  No signs of infection noted once steri-strips removed and incision inspected.  Hematoma noted. Patient received instructed on how to pack wound.  Advised to discontinue abx and will follow up for wound check in 1 week.    Plan:   Follow-up: Return if symptoms worsen or  fail to improve.     Medication Orders:    Additional Orders:  No orders of the defined types were placed in this encounter.       Instructions:  Patient Instructions   No follow up required  Call office if you have any questions or concerns        Doreene Burke, NP  02/15/2020 2:32 PM    The supervising physician for this clinic visit was Dr. Marchia Bond.

## 2020-02-22 ENCOUNTER — Other Ambulatory Visit: Payer: Self-pay | Admitting: Cardiology

## 2020-03-20 ENCOUNTER — Other Ambulatory Visit: Payer: Self-pay | Admitting: Cardiology

## 2020-03-20 NOTE — Telephone Encounter (Signed)
Pt requesting 2.'5mg'$  refill for eliquis when they qualify for '5mg'$  will route to pharmd pool for follow up. 70f 107.8kg, scr 2.52 01/31/20, lovw/tobb 01/31/20

## 2020-03-21 NOTE — Telephone Encounter (Signed)
DVT prophylaxis

## 2020-06-03 ENCOUNTER — Other Ambulatory Visit: Payer: Self-pay | Admitting: Cardiology

## 2020-09-23 ENCOUNTER — Other Ambulatory Visit: Payer: Self-pay | Admitting: Cardiology

## 2020-10-06 ENCOUNTER — Other Ambulatory Visit: Payer: Self-pay

## 2020-10-07 ENCOUNTER — Ambulatory Visit: Payer: Medicaid Other | Admitting: Cardiology

## 2020-10-14 ENCOUNTER — Ambulatory Visit: Payer: Medicaid Other | Admitting: Cardiology

## 2020-10-16 ENCOUNTER — Ambulatory Visit: Payer: Medicaid Other | Admitting: Cardiology

## 2020-10-22 ENCOUNTER — Other Ambulatory Visit: Payer: Self-pay | Admitting: Cardiology

## 2020-10-22 NOTE — Telephone Encounter (Signed)
Pt on Eliquis for DVT indication, does not use same dosing criteria as afib indication. DVT occurred in 2018 so she is on maintenance dose for prevention of recurrent VTE - 2.'5mg'$  BID is correct dosing, please refill

## 2020-10-22 NOTE — Telephone Encounter (Signed)
Pt may qualify for dose increase. Routing to pharmd pool Prescription refill request for Eliquis received. Indication:dvt  Last office visit: tobb 01/31/20 Scr:2.52 01/31/20 Age: 53fWeight:107.8kg

## 2020-12-05 ENCOUNTER — Encounter: Payer: Self-pay | Admitting: Cardiology

## 2020-12-05 ENCOUNTER — Ambulatory Visit (INDEPENDENT_AMBULATORY_CARE_PROVIDER_SITE_OTHER): Payer: Medicaid Other | Admitting: Cardiology

## 2020-12-05 ENCOUNTER — Other Ambulatory Visit: Payer: Self-pay

## 2020-12-05 VITALS — BP 124/80 | HR 64 | Ht 66.0 in | Wt 246.6 lb

## 2020-12-05 DIAGNOSIS — N184 Chronic kidney disease, stage 4 (severe): Secondary | ICD-10-CM

## 2020-12-05 DIAGNOSIS — I272 Pulmonary hypertension, unspecified: Secondary | ICD-10-CM

## 2020-12-05 DIAGNOSIS — I110 Hypertensive heart disease with heart failure: Secondary | ICD-10-CM

## 2020-12-05 NOTE — Patient Instructions (Addendum)
Medication Instructions:  Your physician has recommended you make the following change in your medication:  STOP: Aspirin *If you need a refill on your cardiac medications before your next appointment, please call your pharmacy*   Lab Work: Your physician recommends that you return for lab work in: TODAY CBC, BMP, ProBNP If you have labs (blood work) drawn today and your tests are completely normal, you will receive your results only by: [place] (if you have [place]) OR A paper copy in the mail If you have any lab test that is abnormal or we need to change your treatment, we will call you to review the results.   Testing/Procedures: Your physician has requested that you have an echocardiogram. Echocardiography is a painless test that uses sound waves to create images of your heart. It provides your doctor with information about the size and shape of your heart and how well your heart's chambers and valves are working. This procedure takes approximately one hour. There are no restrictions for this procedure.    Follow-Up: At [facility], you and your health needs are our priority.  As part of our continuing mission to provide you with exceptional heart care, we have created designated Provider Care Teams.  These Care Teams include your primary Cardiologist (physician) and Advanced Practice Providers (APPs -  Physician Assistants and Nurse Practitioners) who all work together to provide you with the care you need, when you need it.  We recommend signing up for the patient portal called "MyChart".  Sign up information is provided on this After Visit Summary.  MyChart is used to connect with patients for Virtual Visits (Telemedicine).  Patients are able to view lab/test results, encounter notes, upcoming appointments, etc.  Non-urgent messages can be sent to your provider as well.   To learn more about what you can do with MyChart, go to [email or web address].    Your next  appointment:   6 month(s)  The format for your next appointment:   In Person  Provider:   Shirlee More, MD    Other Instructions

## 2020-12-05 NOTE — Progress Notes (Signed)
Cardiology Office Note:    Date:  12/05/2020   ID:  Kelsey Leonard, DOB Mar 09, 1955, MRN [id number]  PCP:  Martinique, [name], MD  Cardiologist:  Shirlee More, MD    Referring MD: Martinique, [name], MD    ASSESSMENT:    1. Pulmonary hypertension ([place])   2. Hypertensive heart disease with heart failure ([place])   3. CKD (chronic kidney disease), stage IV (HCC)    PLAN:    In order of problems listed above:  I am uncertain how to approach this problem and I think would be best to recheck an echocardiogram to reestablish if she does indeed have severe pulmonary hypertension present refer on to advanced heart failure clinically that does not appear to be the case at times individuals with severe kidney disease, pulmonary artery hypertension secondary.  Presently she is not volume overloaded. Stable BP is at target she has had no lab work for 10 months and we will check a BMP today along with a proBNP level.  And continue her current antihypertensives furosemide losartan. Recheck labs particular attention to potassium She is on low-dose anticoagulant appropriate for renal dysfunction she will stop aspirin Continue her statin   Next appointment: 6 months I will bring her back to the office along with her sister if she has significant pulmonary hypertension   Medication Adjustments/Labs and Tests Ordered: Current medicines are reviewed at length with the patient today.  Concerns regarding medicines are outlined above.  Orders Placed This Encounter  Procedures   CBC   Basic metabolic panel   Pro b natriuretic peptide (BNP)   ECHOCARDIOGRAM COMPLETE   No orders of the defined types were placed in this encounter.   Chief Complaint  Patient presents with   Follow-up   Congestive Heart Failure    And was noted to have severe pulmonary artery hypertension on echocardiogram in December 2021   History of Present Illness:    Kelsey Leonard is a 65 y.o. 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  She was last seen 01/31/2020 prior to that in December 2021 she had an echocardiogram performed which showed EF 55 to 60% right ventricle is mildly enlarged right atrium is mildly to moderately enlarged and she had severe pulmonary artery hypertension..  Compliance with diet, lifestyle and medications: Yes  She relates no hospital visits or admissions since her last office visit.  There is notation my partner is going to call her sister and discussed the case but there is no documentation.  She has had a cough she wheezes at times she uses inhaler which she has not complained edema orthopnea chest pain palpitation or syncope.  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   Essential hypertension    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   External ear canal stenosis, 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   GERD (  gastroesophageal reflux disease)    Hearing loss 02/24/2012   History of depression    History of DVT (deep vein thrombosis)    History of schizophrenia    Hyperkalemia 10/02/2018   Hyperlipidemia 06/03/2014   Hypertension    Hypotension 09/27/2018   Hypothyroidism    Low blood pressure 09/27/2018   Morbid obesity ([place]) 05/09/2019   Obesity, Class III, BMI 40-49.9 (morbid obesity) ([place]) 03/17/2012   Osteoarthritis    Respiratory failure ([place]) 08/12/2018    Schizophrenia ([place])    Seizures ([place])    Sepsis ([place])    Thrombocytopenia (HCC)     Past Surgical History:  Procedure Laterality Date   ABDOMINAL HYSTERECTOMY     IR FLUORO GUIDE CV LINE RIGHT  08/24/2018   IR REMOVAL TUN CV CATH W/O FL  09/29/2018   IR US GUIDE VASC ACCESS RIGHT  08/24/2018   TEE WITHOUT CARDIOVERSION N/A 08/22/2018   Procedure: TRANSESOPHAGEAL ECHOCARDIOGRAM (TEE);  Surgeon: Lelon Perla, MD;  Location: [facility] ENDOSCOPY;  Service: Cardiovascular;  Laterality: N/A;   THYROIDECTOMY, PARTIAL      Current Medications: Current Meds  Medication Sig   acetaminophen (TYLENOL) 500 MG tablet Take 1,000 mg by mouth every 6 (six) hours as needed for mild pain or moderate pain.   albuterol (VENTOLIN HFA) 108 (90 Base) MCG/ACT inhaler Inhale 2 puffs into the lungs every 6 (six) hours as needed for wheezing or shortness of breath.   benztropine (COGENTIN) 1 MG tablet Take 1 mg by mouth 2 (two) times daily.   clonazePAM (KLONOPIN) 0.5 MG tablet Take 0.5 mg by mouth daily.   ELIQUIS 2.5 MG TABS tablet TAKE ONE TABLET BY MOUTH TWICE DAILY. (ROUND YELLOW TABLET WITH 893 2 1/2)   furosemide (LASIX) 40 MG tablet TAKE ONE TABLET BY MOUTH DAILY. (ROUND WHITE TABLET WITH DV761)   Iloperidone 6 MG TABS Take 3 mg by mouth every morning. And take 9 mg in the evening   losartan (COZAAR) 25 MG tablet Take 25 mg by mouth daily.   methocarbamol (ROBAXIN) 500 MG tablet Take 500 mg by mouth 3 (three) times daily.   nortriptyline (PAMELOR) 25 MG capsule Take 25 mg by mouth 2 (two) times daily.   potassium chloride SA (KLOR-CON) 20 MEQ tablet TAKE ONE TABLET BY MOUTH DAILY. ([place] WHITE TABLET WITH KC M20)   pravastatin (PRAVACHOL) 40 MG tablet TAKE ONE TABLET BY MOUTH DAILY. (ROUND GREEN TABLET WITH PRA 40 APO)   sertraline (ZOLOFT) 50 MG tablet Take 50 mg by mouth at bedtime.   solifenacin (VESICARE) 5 MG tablet Take 5 mg by mouth daily.   traMADol (ULTRAM) 50 MG tablet Take 50 mg by mouth every 6  (six) hours as needed for pain.   [DISCONTINUED] aspirin EC 81 MG tablet Take 81 mg by mouth daily.     Allergies:   Aspirin, Penicillin g, Penicillins, Shellfish allergy, and Sulfa antibiotics   Social History   Socioeconomic History   Marital status: Single    Spouse name: Not on file   Number of children: Not on file   Years of education: Not on file   Highest education level: Not on file  Occupational History   Not on file  Tobacco Use   Smoking status: Never   Smokeless tobacco: Never  Vaping Use   Vaping Use: Never used  Substance and Sexual Activity   Alcohol use: Never   Drug use: Never   Sexual activity: Not Currently  Other Topics Concern  Not on file  Social History Narrative   Not on file   Social Determinants of Health   Financial Resource Strain: Not on file  Food Insecurity: Not on file  Transportation Needs: Not on file  Physical Activity: Not on file  Stress: Not on file  Social Connections: Not on file     Family History: The patient's family history includes Diabetes in her brother and sister; Hypertension in her brother, mother, and sister. ROS:   Please see the history of present illness.    All other systems reviewed and are negative.  EKGs/Labs/Other Studies Reviewed:    The following studies were reviewed today:  EKG:  EKG performed 01/31/2020 last visit showed sinus rhythm normal no signs of pulmonary hypertension or right ventricular enlargement  Recent Labs: 01/31/2020: BUN 35; Creatinine, Ser 2.52; Magnesium 2.2; Potassium 5.1; Sodium 146  Recent Lipid Panel    Component Value Date/Time   TRIG 49 08/13/2018 0347    Physical Exam:    VS:  BP 124/80   Pulse 64   Ht 5\' 6"  (1.676 m)   Wt 246 lb 9.6 oz (111.9 kg)   SpO2 93%   BMI 39.80 kg/m     Wt Readings from Last 3 Encounters:  12/05/20 246 lb 9.6 oz (111.9 kg)  01/31/20 237 lb 9.6 oz (107.8 kg)  05/09/19 227 lb (103 kg)     GEN:  Well nourished, well developed in  no acute distress HEENT: Normal NECK: No JVD; No carotid bruits LYMPHATICS: No lymphadenopathy CARDIAC: P2 is not accentuated RRR, no murmurs, rubs, gallops RESPIRATORY:  Clear to auscultation without rales, wheezing or rhonchi  ABDOMEN: Soft, non-tender, non-distended MUSCULOSKELETAL: 1+ edema left lower extremity that she tells me is chronic edema; No deformity  SKIN: Warm and dry NEUROLOGIC:  Alert and oriented x 3 PSYCHIATRIC:  Normal affect    Signed, Shirlee More, MD  12/05/2020 1:28 PM    [facility]

## 2020-12-06 LAB — PRO B NATRIURETIC PEPTIDE: NT-Pro BNP: 1243 pg/mL — ABNORMAL HIGH (ref 0–287)

## 2020-12-06 LAB — CBC
Hematocrit: 42.2 % (ref 34.0–46.6)
Hemoglobin: 13.1 g/dL (ref 11.1–15.9)
MCH: 29 pg (ref 26.6–33.0)
MCHC: 31 g/dL — ABNORMAL LOW (ref 31.5–35.7)
MCV: 93 fL (ref 79–97)
Platelets: 118 10*3/uL — ABNORMAL LOW (ref 150–450)
RBC: 4.52 x10E6/uL (ref 3.77–5.28)
RDW: 13.1 % (ref 11.7–15.4)
WBC: 3.6 10*3/uL (ref 3.4–10.8)

## 2020-12-06 LAB — BASIC METABOLIC PANEL
BUN/Creatinine Ratio: 11 — ABNORMAL LOW (ref 12–28)
BUN: 31 mg/dL — ABNORMAL HIGH (ref 8–27)
CO2: 21 mmol/L (ref 20–29)
Calcium: 9.3 mg/dL (ref 8.7–10.3)
Chloride: 103 mmol/L (ref 96–106)
Creatinine, Ser: 2.79 mg/dL — ABNORMAL HIGH (ref 0.57–1.00)
Glucose: 96 mg/dL (ref 70–99)
Potassium: 4.8 mmol/L (ref 3.5–5.2)
Sodium: 141 mmol/L (ref 134–144)
eGFR: 19 mL/min/{1.73_m2} — ABNORMAL LOW (ref >59)

## 2020-12-08 ENCOUNTER — Telehealth: Payer: Self-pay

## 2020-12-08 NOTE — Telephone Encounter (Signed)
-----   Message from Richardo Priest, MD sent at 12/07/2020 12:01 PM EST ----- Many abnormalities but overall stable no changes

## 2020-12-08 NOTE — Telephone Encounter (Signed)
Spoke with patient regarding results and recommendation.  Patient verbalizes understanding and is agreeable to plan of care. Advised patient to call back with any issues or concerns.  

## 2020-12-18 ENCOUNTER — Other Ambulatory Visit: Payer: Self-pay | Admitting: Cardiology

## 2020-12-26 ENCOUNTER — Other Ambulatory Visit: Payer: Medicaid Other

## 2021-01-09 ENCOUNTER — Ambulatory Visit (INDEPENDENT_AMBULATORY_CARE_PROVIDER_SITE_OTHER): Payer: Medicaid Other

## 2021-01-09 ENCOUNTER — Other Ambulatory Visit: Payer: Self-pay

## 2021-01-09 DIAGNOSIS — I110 Hypertensive heart disease with heart failure: Secondary | ICD-10-CM

## 2021-01-09 DIAGNOSIS — I272 Pulmonary hypertension, unspecified: Secondary | ICD-10-CM

## 2021-01-09 DIAGNOSIS — N184 Chronic kidney disease, stage 4 (severe): Secondary | ICD-10-CM

## 2021-01-09 LAB — ECHOCARDIOGRAM COMPLETE
Area-P 1/2: 3.72 cm2
S' Lateral: 2.85 cm

## 2021-01-29 DIAGNOSIS — F339 Major depressive disorder, recurrent, unspecified: Secondary | ICD-10-CM | POA: Insufficient documentation

## 2021-02-20 ENCOUNTER — Other Ambulatory Visit: Payer: Self-pay | Admitting: Cardiology

## 2021-04-21 ENCOUNTER — Other Ambulatory Visit: Payer: Self-pay | Admitting: Cardiology

## 2021-05-19 ENCOUNTER — Other Ambulatory Visit: Payer: Self-pay | Admitting: Cardiology

## 2021-05-19 NOTE — Telephone Encounter (Signed)
Prescription refill request for Eliquis received. ?Indication: DVT ?Last office visit: 12/05/20 [facility])  ?Scr: 2.79 (12/05/20 ?Age: 66 ?Weight: 111.9kg ? ?Appropriate dose and refill sent to requested pharmacy.  ?

## 2021-06-05 ENCOUNTER — Encounter: Payer: Self-pay | Admitting: Cardiology

## 2021-06-05 ENCOUNTER — Ambulatory Visit (INDEPENDENT_AMBULATORY_CARE_PROVIDER_SITE_OTHER): Payer: Medicaid Other | Admitting: Cardiology

## 2021-06-05 VITALS — BP 130/80 | HR 77 | Ht 66.0 in | Wt 219.2 lb

## 2021-06-05 DIAGNOSIS — E782 Mixed hyperlipidemia: Secondary | ICD-10-CM | POA: Diagnosis not present

## 2021-06-05 DIAGNOSIS — I272 Pulmonary hypertension, unspecified: Secondary | ICD-10-CM | POA: Diagnosis not present

## 2021-06-05 DIAGNOSIS — N184 Chronic kidney disease, stage 4 (severe): Secondary | ICD-10-CM | POA: Diagnosis not present

## 2021-06-05 DIAGNOSIS — I110 Hypertensive heart disease with heart failure: Secondary | ICD-10-CM | POA: Diagnosis not present

## 2021-06-05 MED ORDER — FUROSEMIDE 40 MG PO TABS: 40.0000 mg | ORAL_TABLET | Freq: Every day | ORAL | 3 refills | Status: AC

## 2021-06-05 NOTE — Addendum Note (Signed)
Addended by: Edwyna Shell I on: 06/05/2021 03:00 PM   Modules accepted: Orders

## 2021-06-05 NOTE — Patient Instructions (Signed)
Medication Instructions:  Your physician has recommended you make the following change in your medication:   START: Furosemide 40 mg daily  *If you need a refill on your cardiac medications before your next appointment, please call your pharmacy*   Lab Work: Your physician recommends that you return for lab work in:   Labs today: CMP, Pro BNP, Lipids  If you have labs (blood work) drawn today and your tests are completely normal, you will receive your results only by: MyChart Message (if you have MyChart) OR A paper copy in the mail If you have any lab test that is abnormal or we need to change your treatment, we will call you to review the results.   Testing/Procedures: None   Follow-Up: At [facility], you and your health needs are our priority.  As part of our continuing mission to provide you with exceptional heart care, we have created designated Provider Care Teams.  These Care Teams include your primary Cardiologist (physician) and Advanced Practice Providers (APPs -  Physician Assistants and Nurse Practitioners) who all work together to provide you with the care you need, when you need it.  We recommend signing up for the patient portal called "MyChart".  Sign up information is provided on this After Visit Summary.  MyChart is used to connect with patients for Virtual Visits (Telemedicine).  Patients are able to view lab/test results, encounter notes, upcoming appointments, etc.  Non-urgent messages can be sent to your provider as well.   To learn more about what you can do with MyChart, go to [email or web address].    Your next appointment:   1 year(s)  The format for your next appointment:   In Person  Provider:   Shirlee More, MD    Other Instructions None  Important Information About Sugar

## 2021-06-05 NOTE — Progress Notes (Signed)
Cardiology Office Note:    Date:  06/05/2021   ID:  Kelsey Leonard, DOB [birth year], MRN [id number]  PCP:  Martinique, [name], MD  Cardiologist:  Shirlee More, MD    Referring MD: [place], [name], MD    ASSESSMENT:    1. Hypertensive heart disease with heart failure ([place])   2. CKD (chronic kidney disease), stage IV ([place])   3. Mixed hyperlipidemia   4. Pulmonary hypertension (HCC)    PLAN:    In order of problems listed above:  Despite noncompliance with her diuretic her blood pressure is at target and her heart failure is compensated I will recheck her renal function and resume her diuretic And continue her ARB. Recheck renal function today Check CMP lipids Secondary to heart failure I do not think she needs another echocardiogram this year  Next appointment: 9 months   Medication Adjustments/Labs and Tests Ordered: Current medicines are reviewed at length with the patient today.  Concerns regarding medicines are outlined above.  No orders of the defined types were placed in this encounter.  No orders of the defined types were placed in this encounter.   Chief Complaint  Patient presents with   Follow-up    History of Present Illness:    Kelsey Leonard is a 66 y.o. 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  Compliance with diet, lifestyle and medications: Yes  She feels that she is doing well. She has been out of her diuretic for a while and think she has developed intermittent edema She has no shortness of breath orthopnea chest pain palpitation or syncope. She has had no recent lab work She is on a statin without muscle pain or  weakness.  An echocardiogram performed 01/09/2021 right ventricle was mildly enlarged with normal systolic function and moderate elevation of pulmonary artery pressure left ventricle normal size mild LVH EF 55 to 60% with elevated filling pressure there is mild mitral and tricuspid regurgitation noted.  1. Left ventricular ejection fraction, by estimation, is 55 to 60%. The  left ventricle has normal function. The left ventricle has no regional  wall motion abnormalities. There is mild left ventricular hypertrophy.  Left ventricular diastolic parameters  are consistent with Grade II diastolic dysfunction (pseudonormalization).   2. Right ventricular systolic function is normal. The right ventricular  size is mildly enlarged. There is moderately elevated pulmonary artery  systolic pressure. Pas 54 mm Hg  3. The mitral valve is normal in structure. Mild mitral valve  regurgitation. No evidence of mitral stenosis.   4. The aortic valve is normal in structure. Aortic valve regurgitation is  not visualized. No aortic stenosis is present.   5. The inferior vena cava is normal in size with greater than 50%  respiratory variability, suggesting right atrial pressure of 3 mmHg.  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   External ear canal stenosis, 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   GERD (gastroesophageal reflux disease)    Hearing loss 02/24/2012   History of depression    History of DVT  (deep vein thrombosis)    History of schizophrenia    Hyperkalemia 10/02/2018   Hyperlipidemia 06/03/2014   Hypertension    Hypotension 09/27/2018   Hypothyroidism    Low blood pressure 09/27/2018   Morbid obesity ([place]) 05/09/2019   Obesity, Class III, BMI 40-49.9 (morbid obesity) ([place]) 03/17/2012   Osteoarthritis    Respiratory failure ([place]) 08/12/2018   Schizophrenia ([place])    Seizures ([place])    Sepsis ([place])    Thrombocytopenia (HCC)     Past Surgical History:  Procedure Laterality Date   ABDOMINAL HYSTERECTOMY     IR FLUORO GUIDE CV LINE RIGHT  08/24/2018   IR REMOVAL TUN CV CATH W/O FL  09/29/2018   IR US GUIDE VASC ACCESS RIGHT  08/24/2018   TEE WITHOUT CARDIOVERSION N/A 08/22/2018   Procedure: TRANSESOPHAGEAL ECHOCARDIOGRAM (TEE);  Surgeon: Lelon Perla, MD;  Location: [facility] ENDOSCOPY;  Service: Cardiovascular;  Laterality: N/A;   THYROIDECTOMY, PARTIAL      Current Medications: Current Meds  Medication Sig   acetaminophen (TYLENOL) 500 MG tablet Take 1,000 mg by mouth every 6 (six) hours as needed for mild pain or moderate pain.   albuterol (VENTOLIN HFA) 108 (90 Base) MCG/ACT inhaler Inhale 2 puffs into the lungs every 6 (six) hours as needed for wheezing or shortness of breath.   apixaban (ELIQUIS) 2.5 MG TABS tablet TAKE ONE TABLET BY MOUTH TWICE DAILY. (ROUND YELLOW TABLET WITH 893 2 1/2)   benztropine (COGENTIN) 1 MG tablet Take 1 mg by mouth 2 (two) times daily.   clonazePAM (KLONOPIN) 0.5 MG tablet Take 0.5 mg by mouth daily.   furosemide (LASIX) 40 MG tablet TAKE ONE TABLET BY MOUTH DAILY. (ROUND WHITE TABLET WITH EH631)   Iloperidone 6 MG TABS Take 3 mg by mouth every morning. And take 9 mg in the evening   losartan (COZAAR) 25 MG tablet Take 25 mg by mouth daily.   methocarbamol (ROBAXIN) 500 MG tablet Take 500 mg by mouth 3 (three) times daily.   nortriptyline (PAMELOR) 25 MG capsule Take 25 mg by mouth 2 (two) times daily.   potassium chloride SA (KLOR-CON M) 20 MEQ  tablet Take 1 tablet (20 mEq total) by mouth daily.   pravastatin (PRAVACHOL) 40 MG tablet TAKE ONE TABLET BY MOUTH DAILY. (ROUND GREEN TABLET WITH PRA 40 APO)   sertraline (ZOLOFT) 50 MG tablet Take 50 mg by mouth at bedtime.   solifenacin (VESICARE) 5 MG tablet Take 5 mg by mouth daily.   traMADol (ULTRAM) 50 MG tablet Take 50 mg by mouth every 6 (six) hours as needed for pain.     Allergies:   Aspirin, Penicillin g, Penicillins, Shellfish allergy, and Sulfa antibiotics   Social History   Socioeconomic History   Marital status: Single    Spouse name: Not on file   Number of children: Not on file   Years of education: Not on file   Highest education level: Not on file  Occupational History   Not on file  Tobacco Use   Smoking status: Never    Passive exposure: Past  Smokeless tobacco: Never  Vaping Use   Vaping Use: Never used  Substance and Sexual Activity   Alcohol use: Never   Drug use: Never   Sexual activity: Not Currently  Other Topics Concern   Not on file  Social History Narrative   Not on file   Social Determinants of Health   Financial Resource Strain: Not on file  Food Insecurity: Not on file  Transportation Needs: Not on file  Physical Activity: Not on file  Stress: Not on file  Social Connections: Not on file     Family History: The patient's family history includes Diabetes in her brother and sister; Hypertension in her brother, mother, and sister. ROS:   Please see the history of present illness.    All other systems reviewed and are negative.  EKGs/Labs/Other Studies Reviewed:    The following studies were reviewed today:  EKG:  EKG ordered today and personally reviewed.  The ekg ordered today demonstrates sinus rhythm with occasional PVCs otherwise normal  Recent Labs: 12/05/2020: BUN 31; Creatinine, Ser 2.79; Hemoglobin 13.1; NT-Pro BNP 1,243; Platelets 118; Potassium 4.8; Sodium 141  Recent Lipid Panel    Component Value Date/Time    TRIG 49 08/13/2018 0347    Physical Exam:    VS:  BP 130/80 (BP Location: Right Arm, Patient Position: Sitting)   Pulse 77   Ht 5\' 6"  (1.676 m)   Wt 219 lb 3.2 oz (99.4 kg)   SpO2 95%   BMI 35.38 kg/m     Wt Readings from Last 3 Encounters:  06/05/21 219 lb 3.2 oz (99.4 kg)  12/05/20 246 lb 9.6 oz (111.9 kg)  01/31/20 237 lb 9.6 oz (107.8 kg)     GEN: Patient Well nourished, well developed in no acute distress HEENT: Normal NECK: No JVD; No carotid bruits LYMPHATICS: No lymphadenopathy CARDIAC: RRR, no murmurs, rubs, gallops RESPIRATORY:  Clear to auscultation without rales, wheezing or rhonchi  ABDOMEN: Soft, non-tender, non-distended MUSCULOSKELETAL:  edema 1+ lower extremity edema No deformity  SKIN: Warm and dry NEUROLOGIC:  Alert and oriented x 3 PSYCHIATRIC:  Normal affect    Signed, Shirlee More, MD  06/05/2021 2:47 PM    [place]

## 2021-06-06 LAB — COMPREHENSIVE METABOLIC PANEL
ALT: 8 IU/L (ref 0–32)
AST: 20 IU/L (ref 0–40)
Albumin/Globulin Ratio: 1.1 — ABNORMAL LOW (ref 1.2–2.2)
Albumin: 3.8 g/dL (ref 3.8–4.8)
Alkaline Phosphatase: 88 IU/L (ref 44–121)
BUN/Creatinine Ratio: 11 — ABNORMAL LOW (ref 12–28)
BUN: 31 mg/dL — ABNORMAL HIGH (ref 8–27)
Bilirubin Total: 0.3 mg/dL (ref 0.0–1.2)
CO2: 21 mmol/L (ref 20–29)
Calcium: 9.6 mg/dL (ref 8.7–10.3)
Chloride: 112 mmol/L — ABNORMAL HIGH (ref 96–106)
Creatinine, Ser: 2.84 mg/dL — ABNORMAL HIGH (ref 0.57–1.00)
Globulin, Total: 3.6 g/dL (ref 1.5–4.5)
Glucose: 95 mg/dL (ref 70–99)
Potassium: 4.9 mmol/L (ref 3.5–5.2)
Sodium: 150 mmol/L — ABNORMAL HIGH (ref 134–144)
Total Protein: 7.4 g/dL (ref 6.0–8.5)
eGFR: 18 mL/min/{1.73_m2} — ABNORMAL LOW (ref >59)

## 2021-06-06 LAB — LIPID PANEL
Chol/HDL Ratio: 4.4 ratio (ref 0.0–4.4)
Cholesterol, Total: 227 mg/dL — ABNORMAL HIGH (ref 100–199)
HDL: 52 mg/dL (ref >39)
LDL Chol Calc (NIH): 158 mg/dL — ABNORMAL HIGH (ref 0–99)
Triglycerides: 97 mg/dL (ref 0–149)
VLDL Cholesterol Cal: 17 mg/dL (ref 5–40)

## 2021-06-06 LAB — PRO B NATRIURETIC PEPTIDE: NT-Pro BNP: 1997 pg/mL — ABNORMAL HIGH (ref 0–287)

## 2021-10-22 ENCOUNTER — Other Ambulatory Visit: Payer: Self-pay

## 2021-10-22 MED ORDER — POTASSIUM CHLORIDE CRYS ER 20 MEQ PO TBCR: 20.0000 meq | EXTENDED_RELEASE_TABLET | Freq: Every day | ORAL | 1 refills | Status: AC

## 2021-10-22 NOTE — Telephone Encounter (Signed)
Refill to pharmacy 

## 2021-11-12 ENCOUNTER — Other Ambulatory Visit: Payer: Self-pay | Admitting: Cardiology

## 2021-11-12 NOTE — Telephone Encounter (Signed)
Prescription refill request for Eliquis received. Indication: DVT Last office visit: [place], 06/05/2021 Scr:  2.84, 5/19/22023 Age: 66 yo  Weight: 99.4 kg

## 2021-11-16 DIAGNOSIS — I493 Ventricular premature depolarization: Secondary | ICD-10-CM

## 2021-11-17 DIAGNOSIS — I361 Nonrheumatic tricuspid (valve) insufficiency: Secondary | ICD-10-CM

## 2021-12-01 DIAGNOSIS — R600 Localized edema: Secondary | ICD-10-CM | POA: Diagnosis not present

## 2022-02-18 ENCOUNTER — Other Ambulatory Visit: Payer: Self-pay | Admitting: Cardiology

## 2022-04-30 DIAGNOSIS — I120 Hypertensive chronic kidney disease with stage 5 chronic kidney disease or end stage renal disease: Secondary | ICD-10-CM | POA: Insufficient documentation

## 2022-10-27 NOTE — Progress Notes (Deleted)
Cardiology Office Note:    Date:  10/27/2022   ID:  Kelsey Leonard, DOB Apr 26, 1955, MRN [id number]  PCP:  [place], [name], MD  Cardiologist:  Norman Herrlich, MD    Referring MD: [place], [name], MD    ASSESSMENT:    No diagnosis found. PLAN:    In order of problems listed above:  ***   Next appointment: ***   Medication Adjustments/Labs and Tests Ordered: Current medicines are reviewed at length with the patient today.  Concerns regarding medicines are outlined above.  No orders of the defined types were placed in this encounter.  No orders of the defined types were placed in this encounter.    History of Present Illness:    Kelsey Leonard is a 67 y.o. female with a hx of hypertensive heart disease with heart failure stage IV CKD pulmonary hypertension secondary to heart failure and mixed hyper lipidemia last seen 06/05/2021.  Her echocardiogram December 2022 showed EF of 55 to 60% moderate pulmonary artery hide pretension and mildly enlarged right ventricle with normal systolic function. 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   External ear canal stenosis, 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   GERD (gastroesophageal reflux disease)    Hearing loss 02/24/2012   History of depression    History of DVT (deep vein thrombosis)    History of schizophrenia    Hyperkalemia 10/02/2018   Hyperlipidemia 06/03/2014    Hypertension    Hypotension 09/27/2018   Hypothyroidism    Low blood pressure 09/27/2018   Morbid obesity (HCC) 05/09/2019   Obesity, Class III, BMI 40-49.9 (morbid obesity) (HCC) 03/17/2012   Osteoarthritis    Respiratory failure (HCC) 08/12/2018   Schizophrenia (HCC)    Seizures (HCC)    Sepsis (HCC)    Thrombocytopenia (HCC)     Current Medications: Current Meds  Medication Sig   acetaminophen (TYLENOL) 500 MG tablet Take 1,000 mg by mouth every 6 (six) hours as needed for mild pain or moderate pain.   albuterol (VENTOLIN HFA) 108 (90 Base) MCG/ACT inhaler Inhale 2 puffs into the lungs every 6 (six) hours as needed for wheezing or shortness of breath.   apixaban (ELIQUIS) 2.5 MG TABS tablet Take 1 tablet (2.5 mg total) by mouth 2 (two) times daily.   benztropine (COGENTIN) 1 MG tablet Take 1 mg by mouth 2 (two) times daily.   clonazePAM (KLONOPIN) 0.5 MG tablet Take 0.5 mg by mouth daily.   furosemide (LASIX) 40 MG tablet Take 1 tablet (40 mg total) by mouth daily.   Iloperidone 6 MG TABS Take 3 mg by mouth every morning. And take 9 mg in the evening   losartan (COZAAR) 25 MG tablet Take 25 mg by mouth daily.   methocarbamol (ROBAXIN) 500 MG tablet Take 500 mg by mouth 3 (three) times daily.   nortriptyline (PAMELOR) 25 MG capsule Take 25 mg by mouth  61.0 in Accession #:    [id number]      Weight:       224.9 lb Date of Birth:  10/02/1955       BSA:          1.99 m Patient Age:    62 years        BP:           127/58 mmHg Patient Gender: F               HR:           62 bpm. Exam Location:  Outpatient   Procedure: Transesophageal Echo  Indications:     Bacteremia  History:         Patient has prior history of Echocardiogram examinations, most recent 08/13/2018. CKD Respiratory failure.  Sonographer:     Tonia Ghent RDCS Referring Phys:  [id number] Emberley Kral S CRENSHAW Diagnosing Phys: Olga Millers MD    PROCEDURE: The  transesophogeal probe was passed through the esophogus of the patient. The patient developed no complications during the procedure.  IMPRESSIONS   1. The right ventricle has normal systolc function. The cavity was normal. 2. The mitral valve is grossly normal. 3. The aortic valve is tricuspid Aortic valve regurgitation is trivial by color flow Doppler. No stenosis of the aortic valve. 4. There is evidence of mild plaque in the descending aorta. 5. The interatrial septum is aneurysmal. 6. The left ventricle has normal systolic function, with an ejection fraction of 55-60%. No evidence of left ventricular regional wall motion abnormalities. 7. Normal LV function; trace AI; mild MV stranding but no vegetations; mild MR and TR.  FINDINGS Left Ventricle: The left ventricle has normal systolic function, with an ejection fraction of 55-60%. No evidence of left ventricular regional wall motion abnormalities.  Right Ventricle: The right ventricle has normal systolic function. The cavity was normal.  Left Atrium: Left atrial size was normal in size.  Right Atrium: Right atrial size was normal in size.  Interatrial Septum: No atrial level shunt detected by color flow Doppler. An The interatrial septum is aneurysmal is seen.  Pericardium: There is no evidence of pericardial effusion.  Mitral Valve: The mitral valve is grossly normal. Mitral valve regurgitation is mild by color flow Doppler.  Tricuspid Valve: The tricuspid valve was normal in structure. Tricuspid valve regurgitation is mild by color flow Doppler.  Aortic Valve: The aortic valve is tricuspid Aortic valve regurgitation is trivial by color flow Doppler. There is No stenosis of the aortic valve.  Pulmonic Valve: The pulmonic valve was grossly normal. Pulmonic valve regurgitation is trivial by color flow Doppler.  Aorta: There is evidence of mild plaque in the descending aorta.  Additional Findings: Normal LV function; trace AI;  mild MV stranding but no vegetations; mild MR and TR.   Olga Millers MD Electronically signed by Olga Millers MD Signature Date/Time: 08/22/2018/4:44:02 PM    Final   MONITORS  LONG TERM MONITOR (3-14 DAYS) 12/08/2018  Narrative The patient wore the monitor for 14 days starting 12/08/2018 . Indication: Palpitations The minimum heart rate was 45 bpm, maximum heart rate was 171 bpm, and average HR was 72 bpm. Predominant underlying rhythm was Sinus Rhythm.  4 Ventricular Tachycardia runs occurred, the run with the fastest interval lasting 4 beats with a maximum heart rate of 162bpm, the longest lasting 19 beats with an average heart rate of 113 bpm.  4 Supraventricular Tachycardia runs occurred, the run with the  2 (two) times daily.   potassium chloride SA (KLOR-CON M) 20 MEQ tablet Take 1 tablet (20 mEq total) by mouth daily.   pravastatin (PRAVACHOL) 40 MG tablet Take 1 tablet (40 mg total) by mouth daily.   sertraline (ZOLOFT) 50 MG tablet Take 50 mg by mouth at bedtime.   solifenacin (VESICARE) 5 MG tablet Take 5 mg by mouth daily.   traMADol (ULTRAM) 50 MG tablet Take 50 mg by mouth every 6 (six) hours as needed for pain.      EKGs/Labs/Other Studies Reviewed:    The following studies were reviewed today:  Cardiac Studies & Procedures     STRESS TESTS  MYOCARDIAL PERFUSION IMAGING 03/13/2019  Narrative  There was no ST segment  deviation noted during stress.  The study is normal. No evidence of ischemia.  This is a low risk study.  Study was not gated because of frequent PVCs.   ECHOCARDIOGRAM  ECHOCARDIOGRAM COMPLETE 01/09/2021  Narrative ECHOCARDIOGRAM REPORT    Patient Name:   FARRIS BLASH Date of Exam: 01/09/2021 Medical Rec #:  [id number]       Height:       66.0 in Accession #:    [id number]      Weight:       246.6 lb Date of Birth:  [birth year]       BSA:          2.186 m Patient Age:    65 years        BP:           124/80 mmHg Patient Gender: F               HR:           71 bpm. Exam Location:  [facility]  Procedure: 2D Echo, Cardiac Doppler, Color Doppler and Strain Analysis  Indications:    Pulmonary hypertension (HCC) [I27.20 (ICD-10-CM)]; Hypertensive heart disease with heart failure (HCC) [I11.0 (ICD-10-CM)]; CKD (chronic kidney disease), stage IV (HCC) [N18.4 (ICD-10-CM)]  History:        Patient has prior history of Echocardiogram examinations, most recent 08/13/2018. DVT, Endocarditis; Signs/Symptoms:Hypotension and Chest Pain.  Sonographer:    Louie Boston RDCS Referring Phys: [id number] Quantisha Marsicano J Bedelia Pong  IMPRESSIONS   1. Left ventricular ejection fraction, by estimation, is 55 to 60%. The left ventricle has normal function. The left ventricle has no regional wall motion abnormalities. There is mild left ventricular hypertrophy. Left ventricular diastolic parameters are consistent with Grade II diastolic dysfunction (pseudonormalization). 2. Right ventricular systolic function is normal. The right ventricular size is mildly enlarged. There is moderately elevated pulmonary artery systolic pressure. 3. The mitral valve is normal in structure. Mild mitral valve regurgitation. No evidence of mitral stenosis. 4. The aortic valve is normal in structure. Aortic valve regurgitation is not visualized. No aortic stenosis is present. 5. The inferior vena cava is normal in size with greater than  50% respiratory variability, suggesting right atrial pressure of 3 mmHg.  FINDINGS Left Ventricle: Left ventricular ejection fraction, by estimation, is 55 to 60%. The left ventricle has normal function. The left ventricle has no regional wall motion abnormalities. The left ventricular internal cavity size was normal in size. There is mild left ventricular hypertrophy. Left ventricular diastolic parameters are consistent with Grade II diastolic dysfunction (pseudonormalization).  Right Ventricle: The right ventricular size is mildly enlarged. No increase in right ventricular wall thickness. Right ventricular systolic function is normal. There is moderately elevated pulmonary artery systolic  2 (two) times daily.   potassium chloride SA (KLOR-CON M) 20 MEQ tablet Take 1 tablet (20 mEq total) by mouth daily.   pravastatin (PRAVACHOL) 40 MG tablet Take 1 tablet (40 mg total) by mouth daily.   sertraline (ZOLOFT) 50 MG tablet Take 50 mg by mouth at bedtime.   solifenacin (VESICARE) 5 MG tablet Take 5 mg by mouth daily.   traMADol (ULTRAM) 50 MG tablet Take 50 mg by mouth every 6 (six) hours as needed for pain.      EKGs/Labs/Other Studies Reviewed:    The following studies were reviewed today:  Cardiac Studies & Procedures     STRESS TESTS  MYOCARDIAL PERFUSION IMAGING 03/13/2019  Narrative  There was no ST segment  deviation noted during stress.  The study is normal. No evidence of ischemia.  This is a low risk study.  Study was not gated because of frequent PVCs.   ECHOCARDIOGRAM  ECHOCARDIOGRAM COMPLETE 01/09/2021  Narrative ECHOCARDIOGRAM REPORT    Patient Name:   FARRIS BLASH Date of Exam: 01/09/2021 Medical Rec #:  [id number]       Height:       66.0 in Accession #:    [id number]      Weight:       246.6 lb Date of Birth:  [birth year]       BSA:          2.186 m Patient Age:    65 years        BP:           124/80 mmHg Patient Gender: F               HR:           71 bpm. Exam Location:  [facility]  Procedure: 2D Echo, Cardiac Doppler, Color Doppler and Strain Analysis  Indications:    Pulmonary hypertension (HCC) [I27.20 (ICD-10-CM)]; Hypertensive heart disease with heart failure (HCC) [I11.0 (ICD-10-CM)]; CKD (chronic kidney disease), stage IV (HCC) [N18.4 (ICD-10-CM)]  History:        Patient has prior history of Echocardiogram examinations, most recent 08/13/2018. DVT, Endocarditis; Signs/Symptoms:Hypotension and Chest Pain.  Sonographer:    Louie Boston RDCS Referring Phys: [id number] Quantisha Marsicano J Bedelia Pong  IMPRESSIONS   1. Left ventricular ejection fraction, by estimation, is 55 to 60%. The left ventricle has normal function. The left ventricle has no regional wall motion abnormalities. There is mild left ventricular hypertrophy. Left ventricular diastolic parameters are consistent with Grade II diastolic dysfunction (pseudonormalization). 2. Right ventricular systolic function is normal. The right ventricular size is mildly enlarged. There is moderately elevated pulmonary artery systolic pressure. 3. The mitral valve is normal in structure. Mild mitral valve regurgitation. No evidence of mitral stenosis. 4. The aortic valve is normal in structure. Aortic valve regurgitation is not visualized. No aortic stenosis is present. 5. The inferior vena cava is normal in size with greater than  50% respiratory variability, suggesting right atrial pressure of 3 mmHg.  FINDINGS Left Ventricle: Left ventricular ejection fraction, by estimation, is 55 to 60%. The left ventricle has normal function. The left ventricle has no regional wall motion abnormalities. The left ventricular internal cavity size was normal in size. There is mild left ventricular hypertrophy. Left ventricular diastolic parameters are consistent with Grade II diastolic dysfunction (pseudonormalization).  Right Ventricle: The right ventricular size is mildly enlarged. No increase in right ventricular wall thickness. Right ventricular systolic function is normal. There is moderately elevated pulmonary artery systolic  61.0 in Accession #:    [id number]      Weight:       224.9 lb Date of Birth:  10/02/1955       BSA:          1.99 m Patient Age:    62 years        BP:           127/58 mmHg Patient Gender: F               HR:           62 bpm. Exam Location:  Outpatient   Procedure: Transesophageal Echo  Indications:     Bacteremia  History:         Patient has prior history of Echocardiogram examinations, most recent 08/13/2018. CKD Respiratory failure.  Sonographer:     Tonia Ghent RDCS Referring Phys:  [id number] Emberley Kral S CRENSHAW Diagnosing Phys: Olga Millers MD    PROCEDURE: The  transesophogeal probe was passed through the esophogus of the patient. The patient developed no complications during the procedure.  IMPRESSIONS   1. The right ventricle has normal systolc function. The cavity was normal. 2. The mitral valve is grossly normal. 3. The aortic valve is tricuspid Aortic valve regurgitation is trivial by color flow Doppler. No stenosis of the aortic valve. 4. There is evidence of mild plaque in the descending aorta. 5. The interatrial septum is aneurysmal. 6. The left ventricle has normal systolic function, with an ejection fraction of 55-60%. No evidence of left ventricular regional wall motion abnormalities. 7. Normal LV function; trace AI; mild MV stranding but no vegetations; mild MR and TR.  FINDINGS Left Ventricle: The left ventricle has normal systolic function, with an ejection fraction of 55-60%. No evidence of left ventricular regional wall motion abnormalities.  Right Ventricle: The right ventricle has normal systolic function. The cavity was normal.  Left Atrium: Left atrial size was normal in size.  Right Atrium: Right atrial size was normal in size.  Interatrial Septum: No atrial level shunt detected by color flow Doppler. An The interatrial septum is aneurysmal is seen.  Pericardium: There is no evidence of pericardial effusion.  Mitral Valve: The mitral valve is grossly normal. Mitral valve regurgitation is mild by color flow Doppler.  Tricuspid Valve: The tricuspid valve was normal in structure. Tricuspid valve regurgitation is mild by color flow Doppler.  Aortic Valve: The aortic valve is tricuspid Aortic valve regurgitation is trivial by color flow Doppler. There is No stenosis of the aortic valve.  Pulmonic Valve: The pulmonic valve was grossly normal. Pulmonic valve regurgitation is trivial by color flow Doppler.  Aorta: There is evidence of mild plaque in the descending aorta.  Additional Findings: Normal LV function; trace AI;  mild MV stranding but no vegetations; mild MR and TR.   Olga Millers MD Electronically signed by Olga Millers MD Signature Date/Time: 08/22/2018/4:44:02 PM    Final   MONITORS  LONG TERM MONITOR (3-14 DAYS) 12/08/2018  Narrative The patient wore the monitor for 14 days starting 12/08/2018 . Indication: Palpitations The minimum heart rate was 45 bpm, maximum heart rate was 171 bpm, and average HR was 72 bpm. Predominant underlying rhythm was Sinus Rhythm.  4 Ventricular Tachycardia runs occurred, the run with the fastest interval lasting 4 beats with a maximum heart rate of 162bpm, the longest lasting 19 beats with an average heart rate of 113 bpm.  4 Supraventricular Tachycardia runs occurred, the run with the

## 2022-10-28 ENCOUNTER — Ambulatory Visit: Payer: Medicaid Other | Attending: Cardiology | Admitting: Cardiology

## 2022-11-19 DEATH — deceased

## 2024-01-07 DIAGNOSIS — R9431 Abnormal electrocardiogram [ECG] [EKG]: Secondary | ICD-10-CM | POA: Diagnosis not present

## 2024-01-07 DIAGNOSIS — I493 Ventricular premature depolarization: Secondary | ICD-10-CM | POA: Diagnosis not present

## 2024-01-07 DIAGNOSIS — I491 Atrial premature depolarization: Secondary | ICD-10-CM | POA: Diagnosis not present
